# Patient Record
Sex: Female | Born: 1965 | Race: White | Hispanic: No | Marital: Married | Smoking: Never smoker
Health system: Southern US, Community
[De-identification: ages and names within clinical notes are randomized; demographics above are authoritative.]

## PROBLEM LIST (undated history)

## (undated) DIAGNOSIS — G43909 Migraine, unspecified, not intractable, without status migrainosus: Secondary | ICD-10-CM

## (undated) DIAGNOSIS — E039 Hypothyroidism, unspecified: Secondary | ICD-10-CM

## (undated) DIAGNOSIS — N809 Endometriosis, unspecified: Secondary | ICD-10-CM

## (undated) DIAGNOSIS — I719 Aortic aneurysm of unspecified site, without rupture: Secondary | ICD-10-CM

## (undated) DIAGNOSIS — J309 Allergic rhinitis, unspecified: Secondary | ICD-10-CM

## (undated) HISTORY — DX: Hypothyroidism, unspecified: E03.9

## (undated) HISTORY — DX: Aortic aneurysm of unspecified site, without rupture: I71.9

## (undated) HISTORY — PX: OTHER SURGICAL HISTORY: SHX169

## (undated) HISTORY — DX: Endometriosis, unspecified: N80.9

## (undated) HISTORY — DX: Migraine, unspecified, not intractable, without status migrainosus: G43.909

## (undated) HISTORY — PX: APPENDECTOMY: SHX54

## (undated) HISTORY — DX: Allergic rhinitis, unspecified: J30.9

## (undated) HISTORY — PX: CHOLECYSTECTOMY: SHX55

---

## 1989-02-03 DIAGNOSIS — N809 Endometriosis, unspecified: Secondary | ICD-10-CM

## 1989-02-03 HISTORY — DX: Endometriosis, unspecified: N80.9

## 1997-10-11 ENCOUNTER — Inpatient Hospital Stay (HOSPITAL_COMMUNITY): Admission: EM | Admit: 1997-10-11 | Discharge: 1997-10-13 | Payer: Self-pay | Admitting: *Deleted

## 1998-07-13 ENCOUNTER — Other Ambulatory Visit: Admission: RE | Admit: 1998-07-13 | Discharge: 1998-07-13 | Payer: Self-pay | Admitting: Obstetrics and Gynecology

## 1999-08-08 ENCOUNTER — Other Ambulatory Visit: Admission: RE | Admit: 1999-08-08 | Discharge: 1999-08-08 | Payer: Self-pay | Admitting: Obstetrics and Gynecology

## 2000-01-09 ENCOUNTER — Inpatient Hospital Stay (HOSPITAL_COMMUNITY): Admission: EM | Admit: 2000-01-09 | Discharge: 2000-01-10 | Payer: Self-pay | Admitting: Emergency Medicine

## 2000-01-09 ENCOUNTER — Encounter: Payer: Self-pay | Admitting: Emergency Medicine

## 2000-08-09 ENCOUNTER — Other Ambulatory Visit: Admission: RE | Admit: 2000-08-09 | Discharge: 2000-08-09 | Payer: Self-pay | Admitting: Gynecology

## 2001-09-03 ENCOUNTER — Other Ambulatory Visit: Admission: RE | Admit: 2001-09-03 | Discharge: 2001-09-03 | Payer: Self-pay | Admitting: Gynecology

## 2002-01-03 ENCOUNTER — Ambulatory Visit (HOSPITAL_COMMUNITY): Admission: RE | Admit: 2002-01-03 | Discharge: 2002-01-03 | Payer: Self-pay | Admitting: Gynecology

## 2002-01-03 ENCOUNTER — Encounter (INDEPENDENT_AMBULATORY_CARE_PROVIDER_SITE_OTHER): Payer: Self-pay | Admitting: *Deleted

## 2003-03-19 ENCOUNTER — Other Ambulatory Visit: Admission: RE | Admit: 2003-03-19 | Discharge: 2003-03-19 | Payer: Self-pay | Admitting: Gynecology

## 2003-06-01 ENCOUNTER — Other Ambulatory Visit: Admission: RE | Admit: 2003-06-01 | Discharge: 2003-06-01 | Payer: Self-pay | Admitting: Gynecology

## 2005-07-04 ENCOUNTER — Other Ambulatory Visit: Admission: RE | Admit: 2005-07-04 | Discharge: 2005-07-04 | Payer: Self-pay | Admitting: Gynecology

## 2005-08-09 ENCOUNTER — Encounter: Admission: RE | Admit: 2005-08-09 | Discharge: 2005-08-09 | Payer: Self-pay | Admitting: Gynecology

## 2006-09-06 ENCOUNTER — Other Ambulatory Visit: Admission: RE | Admit: 2006-09-06 | Discharge: 2006-09-06 | Payer: Self-pay | Admitting: Gynecology

## 2007-10-02 ENCOUNTER — Other Ambulatory Visit: Admission: RE | Admit: 2007-10-02 | Discharge: 2007-10-02 | Payer: Self-pay | Admitting: Gynecology

## 2007-10-03 LAB — CONVERTED CEMR LAB: Pap Smear: NORMAL

## 2008-09-10 ENCOUNTER — Emergency Department (HOSPITAL_COMMUNITY): Admission: EM | Admit: 2008-09-10 | Discharge: 2008-09-10 | Payer: Self-pay | Admitting: Emergency Medicine

## 2008-09-14 ENCOUNTER — Ambulatory Visit: Payer: Self-pay | Admitting: Internal Medicine

## 2008-09-14 ENCOUNTER — Encounter (INDEPENDENT_AMBULATORY_CARE_PROVIDER_SITE_OTHER): Payer: Self-pay | Admitting: *Deleted

## 2008-09-14 DIAGNOSIS — E782 Mixed hyperlipidemia: Secondary | ICD-10-CM | POA: Insufficient documentation

## 2008-09-14 DIAGNOSIS — E785 Hyperlipidemia, unspecified: Secondary | ICD-10-CM | POA: Insufficient documentation

## 2008-09-14 DIAGNOSIS — M549 Dorsalgia, unspecified: Secondary | ICD-10-CM | POA: Insufficient documentation

## 2008-09-14 DIAGNOSIS — K921 Melena: Secondary | ICD-10-CM

## 2008-09-14 DIAGNOSIS — E039 Hypothyroidism, unspecified: Secondary | ICD-10-CM

## 2008-09-15 LAB — CONVERTED CEMR LAB
ALT: 28 units/L (ref 0–35)
AST: 24 units/L (ref 0–37)
Albumin: 3.9 g/dL (ref 3.5–5.2)
Alkaline Phosphatase: 54 units/L (ref 39–117)
BUN: 12 mg/dL (ref 6–23)
Basophils Absolute: 0 10*3/uL (ref 0.0–0.1)
Basophils Relative: 0.6 % (ref 0.0–3.0)
Bilirubin Urine: NEGATIVE
Bilirubin, Direct: 0 mg/dL (ref 0.0–0.3)
CO2: 31 meq/L (ref 19–32)
Calcium: 9.3 mg/dL (ref 8.4–10.5)
Chloride: 106 meq/L (ref 96–112)
Cholesterol: 187 mg/dL (ref 0–200)
Creatinine, Ser: 0.7 mg/dL (ref 0.4–1.2)
Eosinophils Absolute: 0.4 10*3/uL (ref 0.0–0.7)
Eosinophils Relative: 6.9 % — ABNORMAL HIGH (ref 0.0–5.0)
GFR calc non Af Amer: 96.54 mL/min (ref >60)
Glucose, Bld: 90 mg/dL (ref 70–99)
HCT: 39.5 % (ref 36.0–46.0)
HDL: 60.9 mg/dL (ref >39.00)
Hemoglobin, Urine: NEGATIVE
Hemoglobin: 13.4 g/dL (ref 12.0–15.0)
INR: 0.9 (ref 0.8–1.0)
Ketones, ur: NEGATIVE mg/dL
LDL Cholesterol: 113 mg/dL — ABNORMAL HIGH (ref 0–99)
Leukocytes, UA: NEGATIVE
Lymphocytes Relative: 36.6 % (ref 12.0–46.0)
Lymphs Abs: 2 10*3/uL (ref 0.7–4.0)
MCHC: 33.9 g/dL (ref 30.0–36.0)
MCV: 97.9 fL (ref 78.0–100.0)
Monocytes Absolute: 0.4 10*3/uL (ref 0.1–1.0)
Monocytes Relative: 6.8 % (ref 3.0–12.0)
Neutro Abs: 2.6 10*3/uL (ref 1.4–7.7)
Neutrophils Relative %: 49.1 % (ref 43.0–77.0)
Nitrite: NEGATIVE
Platelets: 244 10*3/uL (ref 150.0–400.0)
Potassium: 4 meq/L (ref 3.5–5.1)
Prothrombin Time: 10.2 s — ABNORMAL LOW (ref 10.9–13.3)
RBC: 4.03 M/uL (ref 3.87–5.11)
RDW: 11.5 % (ref 11.5–14.6)
Sodium: 143 meq/L (ref 135–145)
Specific Gravity, Urine: <=1.005 (ref 1.000–1.030)
TSH: 1.14 microintl units/mL (ref 0.35–5.50)
Total Bilirubin: 0.9 mg/dL (ref 0.3–1.2)
Total CHOL/HDL Ratio: 3
Total Protein, Urine: NEGATIVE mg/dL
Total Protein: 7.1 g/dL (ref 6.0–8.3)
Triglycerides: 65 mg/dL (ref 0.0–149.0)
Urine Glucose: NEGATIVE mg/dL
Urobilinogen, UA: 0.2 (ref 0.0–1.0)
VLDL: 13 mg/dL (ref 0.0–40.0)
WBC: 5.4 10*3/uL (ref 4.5–10.5)
pH: 6.5 (ref 5.0–8.0)

## 2008-10-14 ENCOUNTER — Encounter: Payer: Self-pay | Admitting: Women's Health

## 2008-10-14 ENCOUNTER — Other Ambulatory Visit: Admission: RE | Admit: 2008-10-14 | Discharge: 2008-10-14 | Payer: Self-pay | Admitting: Gynecology

## 2008-10-14 ENCOUNTER — Ambulatory Visit: Payer: Self-pay | Admitting: Women's Health

## 2008-11-18 ENCOUNTER — Emergency Department (HOSPITAL_COMMUNITY): Admission: EM | Admit: 2008-11-18 | Discharge: 2008-11-18 | Payer: Self-pay | Admitting: Emergency Medicine

## 2008-11-26 ENCOUNTER — Ambulatory Visit: Payer: Self-pay | Admitting: Internal Medicine

## 2008-11-26 DIAGNOSIS — F411 Generalized anxiety disorder: Secondary | ICD-10-CM | POA: Insufficient documentation

## 2008-11-26 DIAGNOSIS — R079 Chest pain, unspecified: Secondary | ICD-10-CM | POA: Insufficient documentation

## 2008-12-01 ENCOUNTER — Ambulatory Visit: Payer: Self-pay | Admitting: Gastroenterology

## 2008-12-01 DIAGNOSIS — R1032 Left lower quadrant pain: Secondary | ICD-10-CM

## 2008-12-01 DIAGNOSIS — K219 Gastro-esophageal reflux disease without esophagitis: Secondary | ICD-10-CM

## 2008-12-01 LAB — CONVERTED CEMR LAB: Sed Rate: 28 mm/hr — ABNORMAL HIGH (ref 0–22)

## 2009-01-05 ENCOUNTER — Ambulatory Visit: Payer: Self-pay | Admitting: Gastroenterology

## 2009-01-05 ENCOUNTER — Encounter: Payer: Self-pay | Admitting: Gastroenterology

## 2009-01-07 ENCOUNTER — Encounter (INDEPENDENT_AMBULATORY_CARE_PROVIDER_SITE_OTHER): Payer: Self-pay | Admitting: *Deleted

## 2009-01-08 ENCOUNTER — Encounter: Payer: Self-pay | Admitting: Gastroenterology

## 2009-02-25 DIAGNOSIS — K208 Other esophagitis: Secondary | ICD-10-CM

## 2009-02-25 DIAGNOSIS — A048 Other specified bacterial intestinal infections: Secondary | ICD-10-CM | POA: Insufficient documentation

## 2009-02-25 DIAGNOSIS — K5730 Diverticulosis of large intestine without perforation or abscess without bleeding: Secondary | ICD-10-CM | POA: Insufficient documentation

## 2009-02-25 DIAGNOSIS — K449 Diaphragmatic hernia without obstruction or gangrene: Secondary | ICD-10-CM | POA: Insufficient documentation

## 2009-02-25 DIAGNOSIS — K222 Esophageal obstruction: Secondary | ICD-10-CM

## 2009-02-26 ENCOUNTER — Ambulatory Visit: Payer: Self-pay | Admitting: Gastroenterology

## 2009-09-14 ENCOUNTER — Ambulatory Visit: Payer: Self-pay | Admitting: Internal Medicine

## 2009-12-21 ENCOUNTER — Encounter: Admission: RE | Admit: 2009-12-21 | Discharge: 2009-12-21 | Payer: Self-pay | Admitting: Endocrinology

## 2010-09-12 LAB — DIFFERENTIAL
Basophils Absolute: 0 10*3/uL (ref 0.0–0.1)
Basophils Relative: 0 % (ref 0–1)
Eosinophils Absolute: 0.2 10*3/uL (ref 0.0–0.7)
Eosinophils Relative: 3 % (ref 0–5)
Lymphocytes Relative: 27 % (ref 12–46)
Lymphs Abs: 2 10*3/uL (ref 0.7–4.0)
Monocytes Absolute: 0.9 10*3/uL (ref 0.1–1.0)
Monocytes Relative: 12 % (ref 3–12)
Neutro Abs: 4.1 10*3/uL (ref 1.7–7.7)
Neutrophils Relative %: 57 % (ref 43–77)

## 2010-09-12 LAB — COMPREHENSIVE METABOLIC PANEL
ALT: 19 U/L (ref 0–35)
AST: 16 U/L (ref 0–37)
Albumin: 3.4 g/dL — ABNORMAL LOW (ref 3.5–5.2)
Alkaline Phosphatase: 56 U/L (ref 39–117)
BUN: 8 mg/dL (ref 6–23)
CO2: 27 mEq/L (ref 19–32)
Calcium: 8.5 mg/dL (ref 8.4–10.5)
Chloride: 100 mEq/L (ref 96–112)
Creatinine, Ser: 0.72 mg/dL (ref 0.4–1.2)
GFR calc Af Amer: >60 mL/min (ref >60)
GFR calc non Af Amer: >60 mL/min (ref >60)
Glucose, Bld: 98 mg/dL (ref 70–99)
Potassium: 3.7 mEq/L (ref 3.5–5.1)
Sodium: 134 mEq/L — ABNORMAL LOW (ref 135–145)
Total Bilirubin: 0.9 mg/dL (ref 0.3–1.2)
Total Protein: 6.4 g/dL (ref 6.0–8.3)

## 2010-09-12 LAB — CBC
HCT: 36.7 % (ref 36.0–46.0)
Hemoglobin: 12.6 g/dL (ref 12.0–15.0)
MCHC: 34.3 g/dL (ref 30.0–36.0)
MCV: 96.7 fL (ref 78.0–100.0)
Platelets: 273 10*3/uL (ref 150–400)
RBC: 3.8 MIL/uL — ABNORMAL LOW (ref 3.87–5.11)
RDW: 11.9 % (ref 11.5–15.5)
WBC: 7.2 10*3/uL (ref 4.0–10.5)

## 2010-09-12 LAB — POCT CARDIAC MARKERS
CKMB, poc: <1 ng/mL — ABNORMAL LOW (ref 1.0–8.0)
Myoglobin, poc: 46 ng/mL (ref 12–200)
Troponin i, poc: <0.05 ng/mL (ref 0.00–0.09)

## 2010-09-12 LAB — URINALYSIS, ROUTINE W REFLEX MICROSCOPIC
Bilirubin Urine: NEGATIVE
Glucose, UA: NEGATIVE mg/dL
Hgb urine dipstick: NEGATIVE
Ketones, ur: NEGATIVE mg/dL
Nitrite: NEGATIVE
Protein, ur: NEGATIVE mg/dL
Specific Gravity, Urine: 1.007 (ref 1.005–1.030)
Urobilinogen, UA: 0.2 mg/dL (ref 0.0–1.0)
pH: 7 (ref 5.0–8.0)

## 2010-09-12 LAB — LIPASE, BLOOD: Lipase: 22 U/L (ref 11–59)

## 2010-09-12 LAB — POCT PREGNANCY, URINE: Preg Test, Ur: NEGATIVE

## 2010-09-12 LAB — D-DIMER, QUANTITATIVE: D-Dimer, Quant: 0.39 ug/mL-FEU (ref 0.00–0.48)

## 2010-09-14 LAB — URINALYSIS, ROUTINE W REFLEX MICROSCOPIC
Glucose, UA: NEGATIVE mg/dL
Hgb urine dipstick: NEGATIVE
Ketones, ur: NEGATIVE mg/dL
Nitrite: NEGATIVE
Protein, ur: NEGATIVE mg/dL
Specific Gravity, Urine: 1.019 (ref 1.005–1.030)
Urobilinogen, UA: 0.2 mg/dL (ref 0.0–1.0)
pH: 5.5 (ref 5.0–8.0)

## 2010-09-14 LAB — URINE MICROSCOPIC-ADD ON

## 2010-09-14 LAB — POCT PREGNANCY, URINE: Preg Test, Ur: NEGATIVE

## 2010-10-21 NOTE — H&P (Signed)
[facility]  Patient:    Olivia Mendez, Olivia Mendez                         MRN: [id number] Adm. Date:  [id number] Disc. Date: [id number] Attending:  Brandy Hale                         History and Physical  CHIEF COMPLAINT:  Abdominal pain.  HISTORY OF PRESENT ILLNESS:  This is a 45 year old white female who presents with a 4-5 day history of epigastric pain radiating to her back.  This woke her up about midnight.  She had eaten spaghetti for supper and had some cookies and milk just before bedtime.  She was nauseated but did not vomit. She has not had any fever or chills.  She denies diarrhea or change in her bowel habits.  Previously she has had occasional epigastric discomfort and indigestion, but no severe pain.  She denies a history of liver problems, cardiac or pulmonary problems.  She came to the emergency room and saw Dr. Lynelle Doctor.  CBC, liver function tests, amylase, lipase and EKG were all unremarkable.  A gallbladder ultrasound shows numerous gallstones and a thickened gallbladder wall. After 3-4 hours in the emergency room she continues to have some pain although it is better.  She requests surgical intervention at this time ad I was called to see her.  PAST MEDICAL HISTORY:  Appendectomy in 1999 by Dr. Ovidio Kin.  She is hypothyroid.  MEDICATIONS:  Synthroid 0.15 mg q.d.  ALLERGIES:  No known drug allergies.  VICODIN makes her nauseated.  SOCIAL HISTORY:  The patient lives in [place].  She is married.  They have no children.  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 She denies the use of alcohol or tobacco.  FAMILY HISTORY:  Mother living, has had her gallbladder out.  Father living, no medical problems.  One brother living and well.  REVIEW OF SYSTEMS:  All systems are reviewed and are negative except as described above.  PHYSICAL EXAMINATION:  GENERAL:  A healthy young woman in mild distress.  VITAL SIGNS:   Blood pressure 112/90, pulse 82, respirations 16, temperature 96.8.  HEENT:  Sclerae clear. Extraocular movements intact.  Oropharynx clear.  NECK:  Supple, nontender, no mass.  Carotids 2+ and no bruit. No adenopathy and no thyromegaly.  LUNGS:  Clear to auscultation.  No CVA tenderness.  HEART:  Regular rate and rhythm with no murmur.  ABDOMEN:  Soft.  She is tender in the right upper quadrant but no mass, minimal guarding, no distention, active bowel sounds.  Well-healed laparoscopy scars noted from her previous laparoscopic appendectomy.  PELVIC EXAM:  Deferred.  EXTREMITIES:  No edema, good pulses.  NEUROLOGIC EXAM:  Grossly within normal limits.  IMPRESSION: 1. Acute cholecystitis with cholelithiasis. 2. History of hypothyroidism.  PLAN:  The patient will be admitted and will be placed on IV antibiotics and analgesics.  We will proceed with laparoscopic cholecystectomy today.  I have discussed the indications and details of surgery with her, her her husband and her mother.  I have outlined the indications and procedures.  I have discussed the risks and complications in great detail, including but not limited to bile leak, common bile duct injury, bleeding, infection, intestinal injury, wound infection, conversion to open laparotomy and other unforeseen complications.  They seemed to understand these issues well.  At this time all  of her questions were answered.  She agrees with this plan and would like to have this done. DD:  01/09/00 TD:  01/09/00 Job: [id number]

## 2010-10-21 NOTE — H&P (Signed)
NAME:  Olivia Mendez, Olivia Mendez                          ACCOUNT NO.:  [id number]   MEDICAL RECORD NO.:  [id number]                   PATIENT TYPE:  AMB   LOCATION:  [facility]C                                  FACILITY:  [facility]   PHYSICIAN:  Ivor Costa. Farrel Gobble, M.D.              DATE OF BIRTH:  June 13, 1965   DATE OF ADMISSION:  01/03/2002  DATE OF DISCHARGE:                                HISTORY & PHYSICAL   CHIEF COMPLAINT:  Persistent complex adnexal mass, questionable endocervical  lesion.   HISTORY OF PRESENT ILLNESS:  The patient is a 45 year old gravida 0 who was  noted on ultrasound to have a 2 cm complex adnexal mass back in April that  appeared to be consistent with a hemorrhagic cyst.  She was noted to have  thickened endocervical canal with a questionable endocervical polyp.  She  returned back to the office for a sonohysterogram, which showed the  persistence of the adnexal mass; however, failed to demonstrate endocervical  polyp.  The adnexal mass because of its echogenicity was rescanned one more  time for a total of four months' worth of surveillance and was noted to be  unchanged throughout, but again what appears to be an endocervical polyp was  seen.  Of note, the patient reports having what she was told was a  hemorrhagic cyst of the left ovary at the time of an IVF cycle two years  ago.  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   PAST OB/GYN HISTORY:  Significant for regular cycles with four to five days  of flow, occasional dyspareunia, premenstrual dysmenorrhea, as described  above.  Pap smears have been normal.   PAST MEDICAL HISTORY:  Significant for hypothyroidism, which is stable.   PAST SURGICAL HISTORY:  Significant for appendectomy and cholecystectomy in  1999 and 2000 respectively.   MEDICATIONS:  None.   ALLERGIES:  None.   SOCIAL HISTORY:  She is married, one cup of caffeine a day, rare alcohol, no  formal exercise.   FAMILY HISTORY:  Noncontributory, but per patient is negative for  endometriosis.   PHYSICAL EXAMINATION:  GENERAL:  She is a well-appearing female in no acute  distress.  HEART:  Regular rate.  CHEST:  Her lungs are clear to auscultation.  ABDOMEN:  Soft, nontender without rebound or guarding.  PELVIC:  On speculum exam, has unremarkable-appearing cervix.  The uterus is  anteverted, mobile.  The adnexa were without tenderness or fullness.   LABORATORY DATA:  Ultrasound shows an endocervical canal that appears to  measure 11 mm sagittally, questionable echogenic focus seen that measures 4  mm within the canal.  The right ovary has what appeared to be a hemorrhagic  corpus luteum.  The  left has septated cyst that measures 2.6 x 2 x 3.1,  which is slightly larger than the original scan, but is essentially  unchanged with an echogenic focus.  It measures 4 mm.   ASSESSMENT:  Persistent complex adnexal mass, questionable endocervical  lesion.   PLAN:  The patient will present for a laparoscopic cystectomy, possible LSO  with provisions for possible fulguration of endometriosis.  In addition, we  will do a hysteroscopy to ascertain whether or not indeed she does have an  endocervical polyp and, if so, remove that as well.  They had not ruled out  the possibility of doing another IVF cycle to obtain a pregnancy.                                               Ivor Costa. Farrel Gobble, M.D.    THL/MEDQ  D:  01/02/2002  T:  01/03/2002  Job:  [id number]   cc:   Ivor Costa. Farrel Gobble, M.D.

## 2010-10-21 NOTE — Discharge Summary (Signed)
[facility]  Patient:    LENIYAH, MARTELL                         MRN: [id number] Adm. Date:  [id number] Disc. Date: [id number] Attending:  Brandy Hale CC:         2 copies, Dr. Derrell Lolling   Discharge Summary  FINAL DIAGNOSIS:  Acute cholecystitis with cholelithiasis.  OPERATION PERFORMED:  Laparoscopic cholecystectomy, January 09, 2000.  HISTORY OF PRESENT ILLNESS:  This is a 45 year old white female who presented to the emergency room with a four to five day history of epigastric pain radiating to her back.  She had had a spaghetti supper and some cookies and milk before bedtime, and then the pain began slightly after that.  She became nauseated but did not vomit, did not have any fever or chills.  She came to the emergency room and saw Dr. Buren Kos.  CBC, liver function tests, amylase, lipase, and EKG were all unremarkable.  A gallbladder ultrasound showed numerous gallstones and a thickened gallbladder wall consistent with acute cholecystitis.  She received some analgesic narcotic medication in the emergency room and felt somewhat better, although the pain did not resolve.  I was asked to see her and felt that she was having an acute gallbladder attack. It was her desire to be admitted to the hospital for cholecystectomy.  For details of her past medical history, social history, and family history, please see the detailed admission note.  PHYSICAL EXAMINATION:  GENERAL:  Healthy young woman in mild distress.  VITAL SIGNS:  Blood pressure 112/90, temperature 96.8, pulse 82, respirations 16.  HEENT:  Sclerae clear.  Extraocular movements intact.  LUNGS:  Clear to auscultation.  No CVA tenderness.  HEART:  Regular rate and rhythm with no murmur.  ABDOMEN:  Soft.  She had tenderness with some guarding in the right upper quadrant.  No rebound.  No distension.  Bowel sounds present.  Well healed laparoscopy scar from previous laparoscopic  appendectomy.  HOSPITAL COURSE:  The patient was admitted to the hospital.  In the afternoon, she was taken to the operating room and underwent laparoscopic cholecystectomy.  She was found to have an edematous gallbladder with numerous gallstones.  There were no other abnormal findings.  The laparoscopic cholecystectomy was uneventful.  Postoperatively, the patient did relatively well.  She had some nausea off and on over the next 12 hours, but that resolved by the afternoon of August 7. She desired going home in the evening of August 7.  At that time, she was feeling better; the nausea had resolved, and she was afebrile, ambulating, and her wounds looked fine.  She was asked to return to see me in the office in the three to four weeks.  DISCHARGE MEDICATIONS:  She was given a prescription for Vicodin for pain.DD: 01/18/00 TD:  01/18/00 Job: [id number]

## 2010-10-21 NOTE — Op Note (Signed)
[facility]  Patient:    Olivia Mendez, Olivia Mendez                         MRN: [id number] Proc. Date: 01/09/00 Adm. Date:  [id number] Attending:  Brandy Hale                           Operative Report  PREOPERATIVE DIAGNOSIS:  Acute cholecystitis with cholelithiasis.  POSTOPERATIVE DIAGNOSIS:  Acute cholecystitis with cholelithiasis.  OPERATION PERFORMED:  Laparoscopic cholecystectomy.  SURGEON:  Angelia Mould. Derrell Lolling, M.D.  ASSISTANT:  Chevis Pretty, M.D.  BRIEF HISTORY:  This is a 45 year old white female who presented to the emergency room last night with a 4-5 hour history of epigastric pain and nausea.  Examination revealed that she was in mild to moderate pain with abdominal tenderness localized to the right upper quadrant.  All of her lab work was normal, but her ultrasound showed multiple gallstones and a thickened gallbladder wall.  It was felt that her clinical presentation was consistent with acute cholecystitis.  Her pain was improved, but did not resolve following the use of analgesics.  We brought her into the hospital for cholecystectomy.  FINDINGS:  The gallbladder was edematous, thick walled and acutely inflamed. It contained several large gallstones and numerous smaller gallstones.  The anatomy of the cystic duct and cystic artery were conventional.  The cystic duct was very tiny and was long.  The liver, stomach, duodenum, small bowel, large bowel and peritoneal surface were normal to inspection.  DESCRIPTION OF PROCEDURE:  Following the induction of general endotracheal anesthesia, the patients abdomen was prepped and draped in a sterile fashion. Marcaine 0.5% with epinephrine was used as a local infiltration anesthetic. The patient had previously had laparoscopic appendectomy and there was a vertical incision at the lower rim of the umbilicus.  This had a keloid formation and the keloid was excised.  The fascia was incised in the  midline. The abdominal cavity was entered under direct vision.  A 10 mm Hasson trocar was inserted and secured to the pursestring suture of 0 Vicryl.  A video camera was inserted with visualization and findings as described above.  There were no adhesions around the umbilicus.  A 10 mm trocar was placed in the subxiphoid region and two 5 mm trocars were placed in the right mid abdomen.  The gallbladder was identified and placed on traction. Adhesions were taken down.  We dissected out the cystic duct and cystic artery very carefully.  We dissected out the infundibulum of the gallbladder so that we could clearly see significant length of the cystic duct.  We clearly identified the junction of the cystic duct with the gallbladder and clearly identified the region of the common bile duct.  The cystic duct and cystic artery were secured with metal clips and divided.  The gallbladder was dissected from its bed with electrocautery, and that dissection was uneventful.  The gallbladder was removed through the umbilical port.  The operative field was copiously irrigated with 1000 cc of saline.  At the completion of the case, there was no bleeding and no bile leak whatsoever. The trocars were removed under direct vision and there was no bleeding from the trocar sites.  The pneumoperitoneum was released.  The fascia at the umbilicus was closed with 0 Vicryl sutures.  The fascia in the subxiphoid region was closed with 0 Vicryl sutures.  The skin  was closed with subcuticular sutures of 4-0 Vicryl and Steri-Strips.  Clean bandages were placed and the patient taken to the recovery room in stable condition. Estimated blood loss was 20 cc.  Complications none.  Sponge, needle and instrument counts were correct. DD:  01/09/00 TD:  01/10/00 Job: [id number]

## 2010-10-21 NOTE — Op Note (Signed)
NAME:  Olivia Mendez, Olivia Mendez                          ACCOUNT NO.:  [id number]   MEDICAL RECORD NO.:  [id number]                   PATIENT TYPE:  AMB   LOCATION:  [facility]C                                  FACILITY:  [facility]   PHYSICIAN:  Ivor Costa. Farrel Gobble, M.D.              DATE OF BIRTH:  April 27, 1966   DATE OF PROCEDURE:  01/03/2002  DATE OF DISCHARGE:                                 OPERATIVE REPORT   PREOPERATIVE DIAGNOSES:  1. Persistent complex adnexal mass.  2. Possible endocervical polyp.   POSTOPERATIVE DIAGNOSES:  1. Persistent complex adnexal mass.  2. Possible endocervical polyp.   OPERATION PERFORMED:  1. Laparoscopic excision of a left endometrioma.  2. Excision and fulguration of endometriosis.  3. Diagnostic hysteroscopy.   SURGEON:  Ivor Costa. Farrel Gobble, M.D.   ASSISTANT:  Rande Brunt. Eda Paschal, M.D. for the laparoscopic portion.   ANESTHESIA:  General.   FINDINGS:  There was left endometrioma with __________ anterior cul-de-sac  and left side wall.  The right side wall and ovary were normal.  Normal  endocervical canal.  Both ostia were visualized on hysteroscopy.  Final  pathology was cyst wall, left peritoneal side wall, anterior cul-de-sac,  peritoneal biopsies.   DESCRIPTION OF PROCEDURE:  The patient was taken to the operating room.  General anesthesia was induced.  Patient in dorsal lithotomy position,  prepped and draped in the usual sterile fashion.  A bivalve was placed in  the vagina and the cervix was visualized and stabilized with the Hulka  manipulator.  Attention was then turned to the abdomen.  An infraumbilical  incision was made with a scalpel through which the Veress needle was  inserted.  Opening pressure was 6 and a pneumoperitoneum was created until  tympany was appreciated above the liver.  The #10-11 disposable trocar was  then inserted through infraumbilical port and placement in the abdominal  cavity was confirmed.  The findings were as noted above.   The two 5 ports  were made laterally under direct visualization.  Using these two ports, the  ovaries were elevated.  Side walls were inspected.  The left ovary, tubo-  ovarian ligament was grasped.  The ovary was secured and a small peritoneal  window had been noted in the peritoneal side wall.  Using this window as a  starting point, we then sharply excised the thickened white plaques that  were seen earlier in the procedure.  A section of ureter was placed and  hemostasis of the area was confirmed.  While elevating and rotating the  ovary, the endometrioma incidentally ruptured securing the two sides of the  rent, the incision was extended slightly and the cyst wall was able to be  sharply and bluntly dissected out.  There was an area of thickened  endometriosis that had gone through-and-through to the ovarian serosa and  was actually adherent to the side wall.  This was  sharply excised and any  areas of bleeding in the capsule from the sharp ovarian incision was treated  with cautery and hemostasis was assured.  The residual portion on the side  walls where the ovary had been adherent was also treated with cautery in  order to fulgurate the endometriosis although it was not bleeding.  Attention was turned towards the anterior cul-de-sac.  With careful position  of the bladder, the peritoneum was grasped.  The plaque was noted to be  markedly thickened and unable to be elevated and therefore through a small  incision further away from the plaque and undermined the peritoneum in this  area incorporating two endometriosis plaques.  The retroperitoneal space was  gently dissected paying careful attention to the bladder underneath until  both lesions were sharply dissected off.  There was a small area of  endometriosis lateral to where we had gone and this was able to be  successfully treated with cautery.  A small area of bleeding in the inferior  aspect of the initial dissection was  bleeding and gently treated with  cautery.  The pelvis was then irrigated with copious amounts of warm saline.  We reinspected both the ovaries, the left side wall as well as the anterior  cul-de-sac and all areas were noted to be hemostatic.  The instruments were  then removed under direct visualization.  The infraumbilical port was closed  with figure-of-eight of 0 Vicryl.  The skin was closed with 3-0 plain.  The  ports were all injected with 0.25% Marcaine solution for a total of 10 cc.  The lower ports were reapproximated with tincture of Benzoin and Steri-  Strips.  The patient was then repositioned.  The uterine manipulator was  removed.  Bivalve was placed in the vagina.  The cervix was visualized with  a single toothed tenaculum.  Uterus sounded to eight.  The cervix was then  gently dilated up to 21 [place] after which a hysteroscope was advanced to  the cervix as the lesion of concern endocervical, therefore inspection  occurred there and it was noted to be free of any endocervical polyps.  Her  uterine cavity was remarkable only for normal changes to endometrium from  progesterone.  The tubal ostia were seen bilaterally.  The hysteroscope was  then removed.  The I&O deficit was 30 cc.  The patient tolerated both  procedures well.  Sponge and needle counts were correct times two.  She was  transferred to the PACU in stable condition.                                                Ivor Costa. Farrel Gobble, M.D.    THL/MEDQ  D:  01/03/2002  T:  01/08/2002  Job:  [id number]

## 2012-07-23 ENCOUNTER — Ambulatory Visit: Payer: Managed Care, Other (non HMO) | Admitting: Emergency Medicine

## 2012-07-23 VITALS — BP 111/72 | HR 90 | Temp 98.5°F | Resp 16 | Ht 68.0 in | Wt 202.1 lb

## 2012-07-23 DIAGNOSIS — J209 Acute bronchitis, unspecified: Secondary | ICD-10-CM

## 2012-07-23 DIAGNOSIS — J018 Other acute sinusitis: Secondary | ICD-10-CM

## 2012-07-23 MED ORDER — AMOXICILLIN-POT CLAVULANATE 875-125 MG PO TABS: 1.0000 | ORAL_TABLET | Freq: Two times a day (BID) | ORAL | Status: DC

## 2012-07-23 MED ORDER — BENZONATATE 100 MG PO CAPS: 100.0000 mg | ORAL_CAPSULE | Freq: Three times a day (TID) | ORAL | Status: DC | PRN

## 2012-07-23 MED ORDER — PSEUDOEPHEDRINE-GUAIFENESIN ER 60-600 MG PO TB12: 1.0000 | ORAL_TABLET | Freq: Two times a day (BID) | ORAL | Status: AC

## 2012-07-23 NOTE — Patient Instructions (Addendum)

## 2012-07-23 NOTE — Progress Notes (Signed)
[facility] [address] [place] [address] [phone]  Date:  07/23/2012   Name:  Olivia Mendez   DOB:  [birth year]   MRN:  [id number]  PCP:  Oliver Barre, MD    Chief Complaint: Cough, Nasal Congestion, Sore Throat and Headache   History of Present Illness:  Olivia Mendez is a 47 y.o. very pleasant female patient who presents with the following:  Ill for one week and has nasal congestion and cough.  Had a fever over past weekend.  Has purulent nasal drainage and post nasal drip and sore throat.  Cough productive purulent sputum.  No wheezing or shortness of breath.  No nausea or vomiting.  No flu shot.  Husband ill three weeks ago.  No improvement with OTC medication.  Patient Active Problem List  Diagnosis  . HELICOBACTER PYLORI INFECTION  . HYPOTHYROIDISM  . HYPERLIPIDEMIA  . ANXIETY  . EROSIVE ESOPHAGITIS  . ESOPHAGEAL STRICTURE  . GERD  . HIATAL HERNIA  . DIVERTICULOSIS, COLON  . HEMATOCHEZIA  . BACK PAIN  . CHEST PAIN  . ABDOMINAL PAIN, LEFT LOWER QUADRANT    Past Medical History  Diagnosis Date  . Thyroid disease     Past Surgical History  Procedure Laterality Date  . Appendectomy    . Cholecystectomy      History  Substance Use Topics  . Smoking status: Never Smoker   . Smokeless tobacco: Not on file  . Alcohol Use: Yes     Comment: socially rare    Family History  Problem Relation Age of Onset  . Diabetes Mother   . Multiple sclerosis Maternal Grandmother   . Heart disease Maternal Grandfather   . Cancer Paternal Grandmother     melanoma  . Heart disease Paternal Grandfather     Allergies  Allergen Reactions  . Vicodin (Hydrocodone-Acetaminophen)     Medication list has been reviewed and updated.  No current outpatient prescriptions on file prior to visit.   No current facility-administered medications on file prior to visit.    Review of Systems:  As per HPI, otherwise negative.    Physical  Examination: Filed Vitals:   07/23/12 1208  BP: 111/72  Pulse: 90  Temp: 98.5 F (36.9 C)  Resp: 16   Filed Vitals:   07/23/12 1208  Height: 5\' 8"  (1.727 m)  Weight: 202 lb 1.6 oz (91.672 kg)   Body mass index is 30.74 kg/(m^2). Ideal Body Weight: Weight in (lb) to have BMI = 25: 164.1  GEN: WDWN, moderate distress, Non-toxic, A & O x 3 HEENT: Atraumatic, Normocephalic. Neck supple. No masses, No LAD. Ears and Nose: No external deformity. CV: RRR, No M/G/R. No JVD. No thrill. No extra heart sounds. PULM: CTA B, no wheezes, crackles, rhonchi. No retractions. No resp. distress. No accessory muscle use. ABD: S, NT, ND, +BS. No rebound. No HSM. EXTR: No c/c/e NEURO Normal gait.  PSYCH: Normally interactive. Conversant. Not depressed or anxious appearing.  Calm demeanor.    Assessment and Plan: Sinusitis Bronchitis augmentin mucinex Tessalon Follow up as needed  Carmelina Dane, MD

## 2012-07-24 ENCOUNTER — Encounter: Payer: Self-pay | Admitting: Women's Health

## 2012-07-24 ENCOUNTER — Ambulatory Visit (INDEPENDENT_AMBULATORY_CARE_PROVIDER_SITE_OTHER): Payer: Managed Care, Other (non HMO) | Admitting: Women's Health

## 2012-07-24 ENCOUNTER — Other Ambulatory Visit (HOSPITAL_COMMUNITY)
Admission: RE | Admit: 2012-07-24 | Discharge: 2012-07-24 | Disposition: A | Discharge status: Home / Self Care | Payer: Managed Care, Other (non HMO) | Source: Ambulatory Visit | Attending: Obstetrics and Gynecology | Admitting: Obstetrics and Gynecology

## 2012-07-24 VITALS — BP 116/70 | Ht 66.5 in | Wt 190.0 lb

## 2012-07-24 DIAGNOSIS — Z01419 Encounter for gynecological examination (general) (routine) without abnormal findings: Secondary | ICD-10-CM | POA: Insufficient documentation

## 2012-07-24 DIAGNOSIS — Z833 Family history of diabetes mellitus: Secondary | ICD-10-CM

## 2012-07-24 DIAGNOSIS — Z13220 Encounter for screening for lipoid disorders: Secondary | ICD-10-CM

## 2012-07-24 LAB — CBC WITH DIFFERENTIAL/PLATELET
Basophils Absolute: 0 10*3/uL (ref 0.0–0.1)
Eosinophils Relative: 2 % (ref 0–5)
Lymphocytes Relative: 28 % (ref 12–46)
Lymphs Abs: 1.9 10*3/uL (ref 0.7–4.0)
MCV: 89.4 fL (ref 78.0–100.0)
Neutro Abs: 4 10*3/uL (ref 1.7–7.7)
Platelets: 267 10*3/uL (ref 150–400)
RBC: 4.24 MIL/uL (ref 3.87–5.11)
WBC: 6.9 10*3/uL (ref 4.0–10.5)

## 2012-07-24 LAB — LIPID PANEL
HDL: 62 mg/dL (ref >39)
Total CHOL/HDL Ratio: 2.9 Ratio
VLDL: 14 mg/dL (ref 0–40)

## 2012-07-24 LAB — GLUCOSE, RANDOM: Glucose, Bld: 97 mg/dL (ref 70–99)

## 2012-07-24 NOTE — Progress Notes (Signed)
Olivia Mendez [birth year] [id number]    History:    The patient presents for annual exam.  Postmenopausal with no bleeding greater than 3 years/ no HRT. Last office visit 2010. Has seen Dr. Talmage Nap for hyperthyroidism and reports having a normal bone density. Normal Pap and mammograms. Was seen at an urgent care several days ago for bronchitis currently on amoxicillin and getting better.   Past medical history, past surgical history, family history and social history were all reviewed and documented in the EPIC chart. 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 History of cholecystectomy 2001, appendectomy 99. History of infertility/endometriosis/azoospermia. 2003 endometrioma removed.   ROS:  A  ROS was performed and pertinent positives and negatives are included in the history.  Exam:  Filed Vitals:   07/24/12 1515  BP: 116/70    General appearance:  Normal Head/Neck:  Normal, without cervical or supraclavicular adenopathy. Thyroid:  Symmetrical, normal in size, without palpable masses or nodularity. Respiratory  Effort:  Normal  Auscultation:  Clear without wheezing or rhonchi Cardiovascular  Auscultation:  Regular rate, without rubs, murmurs or gallops  Edema/varicosities:  Not grossly evident Abdominal  Soft,nontender, without masses, guarding or rebound.  Liver/spleen:  No organomegaly noted  Hernia:  None appreciated  Skin  Inspection:  Grossly normal  Palpation:  Grossly normal Neurologic/psychiatric  Orientation:  Normal with appropriate conversation.  Mood/affect:  Normal  Genitourinary    Breasts: Examined lying and sitting.     Right: Without masses, retractions, discharge or axillary adenopathy.     Left: Without masses, retractions, discharge or axillary adenopathy.   Inguinal/mons:  Normal without inguinal adenopathy  External genitalia:  Normal  BUS/Urethra/Skene's glands:  Normal  Bladder:  Normal  Vagina:  Normal  Cervix:  Normal  Uterus:    normal in size, shape and contour.  Midline and mobile  Adnexa/parametria:     Rt: Without masses or tenderness.   Lt: Without masses or tenderness.  Anus and perineum: Normal  Digital rectal exam: Normal sphincter tone without palpated masses or tenderness  Assessment/Plan:  47 y.o. M. WF G0  for annual exam.     Normal postmenopausal exam Hypothyroidism-Dr. Talmage Nap labs and meds  Plan: SBE's, reviewed importance of an annual mammogram/last one 2007, vitamin D 2000 daily, calcium rich diet, and exercise encouraged. Reports normal bone density at Dr. Talmage Nap will have report faxed to our office. CBC, glucose, lipid panel, UA, Pap. Reviewed new Pap screening guidelines/last normal Pap 2010.[name]    Harrington [facility], 5:13 PM 07/24/2012

## 2012-07-24 NOTE — Patient Instructions (Addendum)

## 2012-07-25 LAB — URINALYSIS W MICROSCOPIC + REFLEX CULTURE
Casts: NONE SEEN
Crystals: NONE SEEN
Ketones, ur: NEGATIVE mg/dL
Leukocytes, UA: NEGATIVE
Nitrite: NEGATIVE
Specific Gravity, Urine: 1.009 (ref 1.005–1.030)
Squamous Epithelial / LPF: NONE SEEN
Urobilinogen, UA: 0.2 mg/dL (ref 0.0–1.0)
pH: 6 (ref 5.0–8.0)

## 2012-07-30 ENCOUNTER — Encounter: Payer: Self-pay | Admitting: Women's Health

## 2013-04-10 ENCOUNTER — Other Ambulatory Visit: Payer: Self-pay

## 2013-07-25 ENCOUNTER — Encounter: Payer: Managed Care, Other (non HMO) | Admitting: Women's Health

## 2013-07-29 ENCOUNTER — Encounter: Payer: Self-pay | Admitting: Women's Health

## 2013-10-13 ENCOUNTER — Ambulatory Visit: Payer: Managed Care, Other (non HMO) | Admitting: Emergency Medicine

## 2013-10-13 VITALS — BP 116/80 | HR 76 | Temp 98.1°F | Resp 20 | Ht 67.0 in | Wt 212.2 lb

## 2013-10-13 DIAGNOSIS — J209 Acute bronchitis, unspecified: Secondary | ICD-10-CM

## 2013-10-13 DIAGNOSIS — J040 Acute laryngitis: Secondary | ICD-10-CM

## 2013-10-13 MED ORDER — AZITHROMYCIN 250 MG PO TABS: ORAL_TABLET | ORAL | Status: DC

## 2013-10-13 MED ORDER — PROMETHAZINE-CODEINE 6.25-10 MG/5ML PO SYRP: 5.0000 mL | ORAL_SOLUTION | Freq: Four times a day (QID) | ORAL | Status: DC | PRN

## 2013-10-13 NOTE — Progress Notes (Signed)
[facility] [address], [place] [address] [phone]  Date:  10/13/2013   Name:  Olivia Mendez Warrior   DOB:  01/25/1966   MRN:  [id number]  PCP:  Oliver BarreJames John, MD    Chief Complaint: Cough   History of Present Illness:  Olivia Mendez Behrman is a 48 y.o. very pleasant female patient who presents with the following:  Cough productive of purulent scant sputum.  Has no wheezing or shortness of breath.  Some nasal congestion and post nasal drainage. Very hoarse.  No fever or chills.  No nausea of vomiting.  No stool change or rash.  No improvement with over the counter medications or other home remedies. Denies other complaint or health concern today.   Patient Active Problem List   Diagnosis Date Noted  . HELICOBACTER PYLORI INFECTION 02/25/2009  . EROSIVE ESOPHAGITIS 02/25/2009  . ESOPHAGEAL STRICTURE 02/25/2009  . HIATAL HERNIA 02/25/2009  . DIVERTICULOSIS, COLON 02/25/2009  . GERD 12/01/2008  . ABDOMINAL PAIN, LEFT LOWER QUADRANT 12/01/2008  . ANXIETY 11/26/2008  . CHEST PAIN 11/26/2008  . HYPOTHYROIDISM 09/14/2008  . HYPERLIPIDEMIA 09/14/2008  . HEMATOCHEZIA 09/14/2008  . BACK PAIN 09/14/2008    Past Medical History  Diagnosis Date  . Thyroid disease     Past Surgical History  Procedure Laterality Date  . Appendectomy    . Cholecystectomy      History  Substance Use Topics  . Smoking status: Never Smoker   . Smokeless tobacco: Never Used  . Alcohol Use: Yes     Comment: socially rare    Family History  Problem Relation Age of Onset  . Diabetes Mother   . Multiple sclerosis Maternal Grandmother   . Heart disease Maternal Grandfather   . Cancer Paternal Grandmother     melanoma  . Heart disease Paternal Grandfather     Allergies  Allergen Reactions  . Vicodin [Hydrocodone-Acetaminophen]     Medication list has been reviewed and updated.  Current Outpatient Prescriptions on File Prior to Visit  Medication Sig Dispense Refill  .  levothyroxine (SYNTHROID, LEVOTHROID) 150 MCG tablet Take 150 mcg by mouth daily.      [name]. liothyronine (CYTOMEL) 5 MCG tablet Take 5 mcg by mouth daily.       No current facility-administered medications on file prior to visit.    Review of Systems:  As per HPI, otherwise negative.    Physical Examination: Filed Vitals:   10/13/13 2003  BP: 116/80  Pulse: 76  Temp: 98.1 F (36.7 C)  Resp: 20   Filed Vitals:   10/13/13 2003  Height: 5\' 7"  (1.702 m)  Weight: 212 lb 3.2 oz (96.253 kg)   Body mass index is 33.23 kg/(m^2). Ideal Body Weight: Weight in (lb) to have BMI = 25: 159.3  GEN: WDWN, NAD, Non-toxic, A & O x 3 HEENT: Atraumatic, Normocephalic. Neck supple. No masses, No LAD.  Hoarse Ears and Nose: No external deformity. CV: RRR, No M/G/R. No JVD. No thrill. No extra heart sounds. PULM: CTA B, no wheezes, crackles, rhonchi. No retractions. No resp. distress. No accessory muscle use. ABD: Mendez, NT, ND, +BS. No rebound. No HSM. EXTR: No c/c/e NEURO Normal gait.  PSYCH: Normally interactive. Conversant. Not depressed or anxious appearing.  Calm demeanor.    Assessment and Plan: Bronchitis Laryngitis zpak Phen c cod  Signed,  Phillips OdorJeffery Camaron Cammack, MD

## 2013-10-13 NOTE — Addendum Note (Signed)
Addended by: Johnnette LitterARDWELL, Delana Manganello M on: 10/13/2013 08:46 PM   Modules accepted: Orders, Medications

## 2013-10-13 NOTE — Patient Instructions (Signed)
Laryngitis At the top of your windpipe is your voice box. It is the source of your voice. Inside your voice box are 2 bands of muscles called vocal cords. When you breathe, your vocal cords are relaxed and open so that air can get into the lungs. When you decide to say something, these cords come together and vibrate. The sound from these vibrations goes into your throat and comes out through your mouth as sound. Laryngitis is an inflammation of the vocal cords that causes hoarseness, cough, loss of voice, sore throat, and dry throat. Laryngitis can be temporary (acute) or long-term (chronic). Most cases of acute laryngitis improve with time.Chronic laryngitis lasts for more than 3 weeks. CAUSES Laryngitis can often be related to excessive smoking, talking, or yelling, as well as inhalation of toxic fumes and allergies. Acute laryngitis is usually caused by a viral infection, vocal strain, measles or mumps, or bacterial infections. Chronic laryngitis is usually caused by vocal cord strain, vocal cord injury, postnasal drip, growths on the vocal cords, or acid reflux. SYMPTOMS   Cough.  Sore throat.  Dry throat. RISK FACTORS  Respiratory infections.  Exposure to irritating substances, such as cigarette smoke, excessive amounts of alcohol, stomach acids, and workplace chemicals.  Voice trauma, such as vocal cord injury from shouting or speaking too loud. DIAGNOSIS  Your cargiver will perform a physical exam. During the physical exam, your caregiver will examine your throat. The most common sign of laryngitis is hoarseness. Laryngoscopy may be necessary to confirm the diagnosis of this condition. This procedure allows your caregiver to look into the larynx. HOME CARE INSTRUCTIONS  Drink enough fluids to keep your urine clear or pale yellow.  Rest until you no longer have symptoms or as directed by your caregiver.  Breathe in moist air.  Take all medicine as directed by your  caregiver.  Do not smoke.  Talk as little as possible (this includes whispering).  Write on paper instead of talking until your voice is back to normal.  Follow up with your caregiver if your condition has not improved after 10 days. SEEK MEDICAL CARE IF:   You have trouble breathing.  You cough up blood.  You have persistent fever.  You have increasing pain.  You have difficulty swallowing. MAKE SURE YOU:  Understand these instructions.  Will watch your condition.  Will get help right away if you are not doing well or get worse. Document Released: 05/22/2005 Document Revised: 08/14/2011 Document Reviewed: 07/28/2010 ExitCare Patient Information 2014 [employer].  

## 2014-07-06 ENCOUNTER — Ambulatory Visit (INDEPENDENT_AMBULATORY_CARE_PROVIDER_SITE_OTHER): Payer: Managed Care, Other (non HMO) | Admitting: Physician Assistant

## 2014-07-06 VITALS — BP 120/82 | HR 66 | Temp 97.7°F | Resp 20 | Ht 68.0 in | Wt 220.2 lb

## 2014-07-06 DIAGNOSIS — R070 Pain in throat: Secondary | ICD-10-CM

## 2014-07-06 DIAGNOSIS — J069 Acute upper respiratory infection, unspecified: Secondary | ICD-10-CM

## 2014-07-06 DIAGNOSIS — R0981 Nasal congestion: Secondary | ICD-10-CM

## 2014-07-06 DIAGNOSIS — B9689 Other specified bacterial agents as the cause of diseases classified elsewhere: Secondary | ICD-10-CM

## 2014-07-06 MED ORDER — AMOXICILLIN 875 MG PO TABS: 875.0000 mg | ORAL_TABLET | Freq: Two times a day (BID) | ORAL | Status: AC

## 2014-07-06 MED ORDER — MAGIC MOUTHWASH W/LIDOCAINE: 5.0000 mL | Freq: Four times a day (QID) | ORAL | Status: DC | PRN

## 2014-07-06 MED ORDER — AMOXICILLIN 875 MG PO TABS: 875.0000 mg | ORAL_TABLET | Freq: Two times a day (BID) | ORAL | Status: DC

## 2014-07-06 MED ORDER — MUCINEX DM MAXIMUM STRENGTH 60-1200 MG PO TB12: 1.0000 | ORAL_TABLET | Freq: Two times a day (BID) | ORAL | Status: AC

## 2014-07-06 MED ORDER — MUCINEX DM MAXIMUM STRENGTH 60-1200 MG PO TB12: 1.0000 | ORAL_TABLET | Freq: Two times a day (BID) | ORAL | Status: DC

## 2014-07-06 NOTE — Progress Notes (Signed)
MRN: [id number] DOB: 01/30/1966  Subjective:   Chief Complaint  Patient presents with  . Sore Throat    ears stopped up, cough that is dry,body aches, fever x 1 week.      Olivia Mendez is a 49 y.o. female presenting for 5 days of sore throat.  She has body aches and fatigue.  Thie body ache and fatigue improved, but the sorethroat feels like it is getting worse over the last day.  Patient has hoarseness that began today.  She has non-productive cough.  She has nasal congestion.  She blows out green mucus and what she describes as "brain matter" in appearance.  She also spits out green mucus.  She also has ear pain and fullness that began 1 week ago.  She denies dyspnea.  She has taken tussin guanifesin, tylenol which helped a little.  Tea with honey which helps short term.  Ear pain bilaterally.  Last week, she had abdominal pain and diarrhea, but this has since resolved.  Husband is sick contact.    Olivia Mendez has a current medication list which includes the following prescription(s): levothyroxine and liothyronine.  She is allergic to vicodin.  Olivia Mendez  has a past medical history of Thyroid disease. Also  has past surgical history that includes Appendectomy and Cholecystectomy.  ROS As in subjective.  Objective:   Vitals: BP 120/82 mmHg  Pulse 66  Temp(Src) 97.7 F (36.5 C) (Oral)  Resp 20  Ht 5\' 8"  (1.727 m)  Wt 220 lb 4 oz (99.905 kg)  BMI 33.50 kg/m2  SpO2 98%  LMP 10/03/2008  Physical Exam  Constitutional: She is oriented to person, place, and time and well-developed, well-nourished, and in no distress. No distress.  HENT:  Head: Normocephalic and atraumatic.  Right Ear: Tympanic membrane, external ear and ear canal normal.  Left Ear: Tympanic membrane, external ear and ear canal normal.  Nose: Mucosal edema (mild edema) and rhinorrhea present. Right sinus exhibits no maxillary sinus tenderness and no frontal sinus tenderness. Left sinus exhibits no maxillary sinus  tenderness and no frontal sinus tenderness.  Mouth/Throat: No trismus in the jaw. Uvula swelling present. Posterior oropharyngeal erythema (mild ) present. No oropharyngeal exudate or posterior oropharyngeal edema.  Eyes: Conjunctivae are normal. Pupils are equal, round, and reactive to light. Right eye exhibits no discharge. Left eye exhibits no discharge.  Neck: Normal range of motion. Neck supple.  Cardiovascular: Normal rate, regular rhythm and normal heart sounds.   Pulmonary/Chest: Effort normal and breath sounds normal. No respiratory distress. She has no wheezes.  Lymphadenopathy:    She has no cervical adenopathy (Tender with palpation down the anterior cervical.  ).  Neurological: She is alert and oriented to person, place, and time.  Psychiatric: Mood and affect normal.     Assessment and Plan :  49 year old female with chief complaint of sorethroat, body aches, and ear fullness.  Only one week of symptoms without much present on physical exam.  But given hx of progressive worsening again, as well as green mucus, will treat as URI, possible sinus infection of bacterial etiology.    Bacterial upper respiratory infection  amoxicillin (AMOXIL) 875 MG tablet BID 10 days, will contact in 48 hrs for patient improvement  Throat pain - Plan: Alum & Mag Hydroxide-Simeth (MAGIC MOUTHWASH W/LIDOCAINE) SOLN  Nasal congestion - Plan: Dextromethorphan-Guaifenesin (MUCINEX DM MAXIMUM STRENGTH) 60-1200 MG TB12,   Trena PlattStephanie English, PA-C [facility] 2/1/20169:07  PM       

## 2014-07-06 NOTE — Patient Instructions (Signed)
Upper Respiratory Infection, Adult An upper respiratory infection (URI) is also sometimes known as the common cold. The upper respiratory tract includes the nose, sinuses, throat, trachea, and bronchi. Bronchi are the airways leading to the lungs. Most people improve within 1 week, but symptoms can last up to 2 weeks. A residual cough may last even longer.  CAUSES Many different viruses can infect the tissues lining the upper respiratory tract. The tissues become irritated and inflamed and often become very moist. Mucus production is also common. A cold is contagious. You can easily spread the virus to others by oral contact. This includes kissing, sharing a glass, coughing, or sneezing. Touching your mouth or nose and then touching a surface, which is then touched by another person, can also spread the virus. SYMPTOMS  Symptoms typically develop 1 to 3 days after you come in contact with a cold virus. Symptoms vary from person to person. They may include:  Runny nose.  Sneezing.  Nasal congestion.  Sinus irritation.  Sore throat.  Loss of voice (laryngitis).  Cough.  Fatigue.  Muscle aches.  Loss of appetite.  Headache.  Low-grade fever. DIAGNOSIS  You might diagnose your own cold based on familiar symptoms, since most people get a cold 2 to 3 times a year. Your caregiver can confirm this based on your exam. Most importantly, your caregiver can check that your symptoms are not due to another disease such as strep throat, sinusitis, pneumonia, asthma, or epiglottitis. Blood tests, throat tests, and X-rays are not necessary to diagnose a common cold, but they may sometimes be helpful in excluding other more serious diseases. Your caregiver will decide if any further tests are required. RISKS AND COMPLICATIONS  You may be at risk for a more severe case of the common cold if you smoke cigarettes, have chronic heart disease (such as heart failure) or lung disease (such as asthma), or if  you have a weakened immune system. The very young and very old are also at risk for more serious infections. Bacterial sinusitis, middle ear infections, and bacterial pneumonia can complicate the common cold. The common cold can worsen asthma and chronic obstructive pulmonary disease (COPD). Sometimes, these complications can require emergency medical care and may be life-threatening. PREVENTION  The best way to protect against getting a cold is to practice good hygiene. Avoid oral or hand contact with people with cold symptoms. Wash your hands often if contact occurs. There is no clear evidence that vitamin C, vitamin E, echinacea, or exercise reduces the chance of developing a cold. However, it is always recommended to get plenty of rest and practice good nutrition. TREATMENT  Treatment is directed at relieving symptoms. There is no cure. Antibiotics are not effective, because the infection is caused by a virus, not by bacteria. Treatment may include:  Increased fluid intake. Sports drinks offer valuable electrolytes, sugars, and fluids.  Breathing heated mist or steam (vaporizer or shower).  Eating chicken soup or other clear broths, and maintaining good nutrition.  Getting plenty of rest.  Using gargles or lozenges for comfort.  Controlling fevers with ibuprofen or acetaminophen as directed by your caregiver.  Increasing usage of your inhaler if you have asthma. Zinc gel and zinc lozenges, taken in the first 24 hours of the common cold, can shorten the duration and lessen the severity of symptoms. Pain medicines may help with fever, muscle aches, and throat pain. A variety of non-prescription medicines are available to treat congestion and runny nose. Your caregiver   can make recommendations and may suggest nasal or lung inhalers for other symptoms.  HOME CARE INSTRUCTIONS   Only take over-the-counter or prescription medicines for pain, discomfort, or fever as directed by your  caregiver.  Use a warm mist humidifier or inhale steam from a shower to increase air moisture. This may keep secretions moist and make it easier to breathe.  Drink enough water and fluids to keep your urine clear or pale yellow.  Rest as needed.  Return to work when your temperature has returned to normal or as your caregiver advises. You may need to stay home longer to avoid infecting others. You can also use a face mask and careful hand washing to prevent spread of the virus. SEEK MEDICAL CARE IF:   After the first few days, you feel you are getting worse rather than better.  You need your caregiver's advice about medicines to control symptoms.  You develop chills, worsening shortness of breath, or brown or red sputum. These may be signs of pneumonia.  You develop yellow or brown nasal discharge or pain in the face, especially when you bend forward. These may be signs of sinusitis.  You develop a fever, swollen neck glands, pain with swallowing, or white areas in the back of your throat. These may be signs of strep throat. SEEK IMMEDIATE MEDICAL CARE IF:   You have a fever.  You develop severe or persistent headache, ear pain, sinus pain, or chest pain.  You develop wheezing, a prolonged cough, cough up blood, or have a change in your usual mucus (if you have chronic lung disease).  You develop sore muscles or a stiff neck. Document Released: 11/15/2000 Document Revised: 08/14/2011 Document Reviewed: 08/27/2013 Exit[employer] Patient Information 2015 [employer]. This information is not intended to replace advice given to you by your health care provider. Make sure you discuss any questions you have with your health care provider.  

## 2014-11-13 ENCOUNTER — Ambulatory Visit (INDEPENDENT_AMBULATORY_CARE_PROVIDER_SITE_OTHER): Payer: Managed Care, Other (non HMO) | Admitting: Family

## 2014-11-13 ENCOUNTER — Other Ambulatory Visit (INDEPENDENT_AMBULATORY_CARE_PROVIDER_SITE_OTHER): Payer: Managed Care, Other (non HMO)

## 2014-11-13 ENCOUNTER — Encounter: Payer: Self-pay | Admitting: Women's Health

## 2014-11-13 ENCOUNTER — Ambulatory Visit (INDEPENDENT_AMBULATORY_CARE_PROVIDER_SITE_OTHER): Payer: Managed Care, Other (non HMO) | Admitting: Women's Health

## 2014-11-13 ENCOUNTER — Other Ambulatory Visit (HOSPITAL_COMMUNITY)
Admission: RE | Admit: 2014-11-13 | Discharge: 2014-11-13 | Disposition: A | Discharge status: Home / Self Care | Payer: Managed Care, Other (non HMO) | Source: Ambulatory Visit | Attending: Women's Health | Admitting: Women's Health

## 2014-11-13 ENCOUNTER — Encounter: Payer: Self-pay | Admitting: Family

## 2014-11-13 VITALS — BP 110/80 | HR 71 | Temp 98.0°F | Resp 18 | Ht 67.0 in | Wt 222.8 lb

## 2014-11-13 VITALS — BP 112/79

## 2014-11-13 DIAGNOSIS — R600 Localized edema: Secondary | ICD-10-CM

## 2014-11-13 DIAGNOSIS — T148 Other injury of unspecified body region: Secondary | ICD-10-CM | POA: Diagnosis not present

## 2014-11-13 DIAGNOSIS — Z01419 Encounter for gynecological examination (general) (routine) without abnormal findings: Secondary | ICD-10-CM

## 2014-11-13 DIAGNOSIS — Z1151 Encounter for screening for human papillomavirus (HPV): Secondary | ICD-10-CM | POA: Diagnosis present

## 2014-11-13 DIAGNOSIS — R5383 Other fatigue: Secondary | ICD-10-CM

## 2014-11-13 DIAGNOSIS — W57XXXA Bitten or stung by nonvenomous insect and other nonvenomous arthropods, initial encounter: Secondary | ICD-10-CM | POA: Insufficient documentation

## 2014-11-13 LAB — COMPREHENSIVE METABOLIC PANEL
ALT: 46 U/L — ABNORMAL HIGH (ref 0–35)
AST: 29 U/L (ref 0–37)
Albumin: 4.2 g/dL (ref 3.5–5.2)
Alkaline Phosphatase: 63 U/L (ref 39–117)
BUN: 12 mg/dL (ref 6–23)
CALCIUM: 10.2 mg/dL (ref 8.4–10.5)
CHLORIDE: 104 meq/L (ref 96–112)
CO2: 28 mEq/L (ref 19–32)
Creatinine, Ser: 0.73 mg/dL (ref 0.40–1.20)
GFR: 89.57 mL/min (ref >60.00)
Glucose, Bld: 88 mg/dL (ref 70–99)
Potassium: 4.6 mEq/L (ref 3.5–5.1)
SODIUM: 138 meq/L (ref 135–145)
TOTAL PROTEIN: 7.1 g/dL (ref 6.0–8.3)
Total Bilirubin: 0.7 mg/dL (ref 0.2–1.2)

## 2014-11-13 LAB — CBC
HEMATOCRIT: 41.4 % (ref 36.0–46.0)
HEMOGLOBIN: 14.1 g/dL (ref 12.0–15.0)
MCHC: 34.2 g/dL (ref 30.0–36.0)
MCV: 94 fl (ref 78.0–100.0)
Platelets: 314 10*3/uL (ref 150.0–400.0)
RBC: 4.4 Mil/uL (ref 3.87–5.11)
RDW: 12.9 % (ref 11.5–15.5)
WBC: 6.9 10*3/uL (ref 4.0–10.5)

## 2014-11-13 LAB — B12 AND FOLATE PANEL
Folate: >24.8 ng/mL (ref >5.9)
VITAMIN B 12: 391 pg/mL (ref 211–911)

## 2014-11-13 LAB — IBC PANEL
Iron: 75 ug/dL (ref 42–145)
Saturation Ratios: 16.1 % — ABNORMAL LOW (ref 20.0–50.0)
Transferrin: 333 mg/dL (ref 212.0–360.0)

## 2014-11-13 LAB — HEMOGLOBIN A1C: Hgb A1c MFr Bld: 5.5 % (ref 4.6–6.5)

## 2014-11-13 LAB — TSH: TSH: 2.12 u[IU]/mL (ref 0.35–4.50)

## 2014-11-13 NOTE — Assessment & Plan Note (Signed)
2 small tick bites noted with no evidence of rashes. No symptoms of fevers or joint pains noted. Unlikely to be [facility] spotted fever or Lyme disease. Continue to monitor at this time. Follow-up if symptoms begin to develop. Information provided to patient regarding both conditions.

## 2014-11-13 NOTE — Assessment & Plan Note (Signed)
Lower extremity edema of undetermined origin. Notes to be improving slowly. Check complete metabolic panel to rule out kidney and liver function as potential pathology. Treat conservatively at this time with leg elevation, decrease in salt, plenty of fluids, and compression socks as needed. Follow-up pending lab work.

## 2014-11-13 NOTE — Patient Instructions (Signed)
Health Recommendations for Postmenopausal Women Respected and ongoing research has looked at the most common causes of death, disability, and poor quality of life in postmenopausal women. The causes include heart disease, diseases of blood vessels, diabetes, depression, cancer, and bone loss (osteoporosis). Many things can be done to help lower the chances of developing these and other common problems. CARDIOVASCULAR DISEASE Heart Disease: A heart attack is a medical emergency. Know the signs and symptoms of a heart attack. Below are things women can do to reduce their risk for heart disease.   Do not smoke. If you smoke, quit.  Aim for a healthy weight. Being overweight causes many preventable deaths. Eat a healthy and balanced diet and drink an adequate amount of liquids.  Get moving. Make a commitment to be more physically active. Aim for 30 minutes of activity on most, if not all days of the week.  Eat for heart health. Choose a diet that is low in saturated fat and cholesterol and eliminate trans fat. Include whole grains, vegetables, and fruits. Read and understand the labels on food containers before buying.  Know your numbers. Ask your caregiver to check your blood pressure, cholesterol (total, HDL, LDL, triglycerides) and blood glucose. Work with your caregiver on improving your entire clinical picture.  High blood pressure. Limit or stop your table salt intake (try salt substitute and food seasonings). Avoid salty foods and drinks. Read labels on food containers before buying. Eating well and exercising can help control high blood pressure. STROKE  Stroke is a medical emergency. Stroke may be the result of a blood clot in a blood vessel in the brain or by a brain hemorrhage (bleeding). Know the signs and symptoms of a stroke. To lower the risk of developing a stroke:  Avoid fatty foods.  Quit smoking.  Control your diabetes, blood pressure, and irregular heart rate. THROMBOPHLEBITIS  (BLOOD CLOT) OF THE LEG  Becoming overweight and leading a stationary lifestyle may also contribute to developing blood clots. Controlling your diet and exercising will help lower the risk of developing blood clots. CANCER SCREENING  Breast Cancer: Take steps to reduce your risk of breast cancer.  You should practice "breast self-awareness." This means understanding the normal appearance and feel of your breasts and should include breast self-examination. Any changes detected, no matter how small, should be reported to your caregiver.  After age 40, you should have a clinical breast exam (CBE) every year.  Starting at age 40, you should consider having a mammogram (breast X-ray) every year.  If you have a family history of breast cancer, talk to your caregiver about genetic screening.  If you are at high risk for breast cancer, talk to your caregiver about having an MRI and a mammogram every year.  Intestinal or Stomach Cancer: Tests to consider are a rectal exam, fecal occult blood, sigmoidoscopy, and colonoscopy. Women who are high risk may need to be screened at an earlier age and more often.  Cervical Cancer:  Beginning at age 30, you should have a Pap test every 3 years as long as the past 3 Pap tests have been normal.  If you have had past treatment for cervical cancer or a condition that could lead to cancer, you need Pap tests and screening for cancer for at least 20 years after your treatment.  If you had a hysterectomy for a problem that was not cancer or a condition that could lead to cancer, then you no longer need Pap tests.    If you are between ages 65 and 70, and you have had normal Pap tests going back 10 years, you no longer need Pap tests.  If Pap tests have been discontinued, risk factors (such as a new sexual partner) need to be reassessed to determine if screening should be resumed.  Some medical problems can increase the chance of getting cervical cancer. In these  cases, your caregiver may recommend more frequent screening and Pap tests.  Uterine Cancer: If you have vaginal bleeding after reaching menopause, you should notify your caregiver.  Ovarian Cancer: Other than yearly pelvic exams, there are no reliable tests available to screen for ovarian cancer at this time except for yearly pelvic exams.  Lung Cancer: Yearly chest X-rays can detect lung cancer and should be done on high risk women, such as cigarette smokers and women with chronic lung disease (emphysema).  Skin Cancer: A complete body skin exam should be done at your yearly examination. Avoid overexposure to the sun and ultraviolet light lamps. Use a strong sun block cream when in the sun. All of these things are important for lowering the risk of skin cancer. MENOPAUSE Menopause Symptoms: Hormone therapy products are effective for treating symptoms associated with menopause:  Moderate to severe hot flashes.  Night sweats.  Mood swings.  Headaches.  Tiredness.  Loss of sex drive.  Insomnia.  Other symptoms. Hormone replacement carries certain risks, especially in older women. Women who use or are thinking about using estrogen or estrogen with progestin treatments should discuss that with their caregiver. Your caregiver will help you understand the benefits and risks. The ideal dose of hormone replacement therapy is not known. The Food and Drug Administration (FDA) has concluded that hormone therapy should be used only at the lowest doses and for the shortest amount of time to reach treatment goals.  OSTEOPOROSIS Protecting Against Bone Loss and Preventing Fracture If you use hormone therapy for prevention of bone loss (osteoporosis), the risks for bone loss must outweigh the risk of the therapy. Ask your caregiver about other medications known to be safe and effective for preventing bone loss and fractures. To guard against bone loss or fractures, the following is recommended:  If  you are younger than age 50, take 1000 mg of calcium and at least 600 mg of Vitamin D per day.  If you are older than age 50 but younger than age 70, take 1200 mg of calcium and at least 600 mg of Vitamin D per day.  If you are older than age 70, take 1200 mg of calcium and at least 800 mg of Vitamin D per day. Smoking and excessive alcohol intake increases the risk of osteoporosis. Eat foods rich in calcium and vitamin D and do weight bearing exercises several times a week as your caregiver suggests. DIABETES Diabetes Mellitus: If you have type I or type 2 diabetes, you should keep your blood sugar under control with diet, exercise, and recommended medication. Avoid starchy and fatty foods, and too many sweets. Being overweight can make diabetes control more difficult. COGNITION AND MEMORY Cognition and Memory: Menopausal hormone therapy is not recommended for the prevention of cognitive disorders such as Alzheimer's disease or memory loss.  DEPRESSION  Depression may occur at any age, but it is common in elderly women. This may be because of physical, medical, social (loneliness), or financial problems and needs. If you are experiencing depression because of medical problems and control of symptoms, talk to your caregiver about this. Physical   activity and exercise may help with mood and sleep. Community and volunteer involvement may improve your sense of value and worth. If you have depression and you feel that the problem is getting worse or becoming severe, talk to your caregiver about which treatment options are best for you. ACCIDENTS  Accidents are common and can be serious in elderly woman. Prepare your house to prevent accidents. Eliminate throw rugs, place hand bars in bath, shower, and toilet areas. Avoid wearing high heeled shoes or walking on wet, snowy, and icy areas. Limit or stop driving if you have vision or hearing problems, or if you feel you are unsteady with your movements and  reflexes. HEPATITIS C Hepatitis C is a type of viral infection affecting the liver. It is spread mainly through contact with blood from an infected person. It can be treated, but if left untreated, it can lead to severe liver damage over the years. Many people who are infected do not know that the virus is in their blood. If you are a "baby-boomer", it is recommended that you have one screening test for Hepatitis C. IMMUNIZATIONS  Several immunizations are important to consider having during your senior years, including:   Tetanus, diphtheria, and pertussis booster shot.  Influenza every year before the flu season begins.  Pneumonia vaccine.  Shingles vaccine.  Others, as indicated based on your specific needs. Talk to your caregiver about these. Document Released: 07/14/2005 Document Revised: 10/06/2013 Document Reviewed: 03/09/2008 [employer] Patient Information 2015 [employer]. This information is not intended to replace advice given to you by your health care provider. Make sure you discuss any questions you have with your health care provider.  

## 2014-11-13 NOTE — Progress Notes (Signed)
Olivia Mendez 50-28-1967 [id number]    History:    Presents for annual exam.  Postmenopausal/no HRT/no bleeding. History of infertility. 2003 endometrioma. Hypothyroid Dr. Talmage Nap manages. Primary care manages labs. Normal DEXA. Overdue years for mammogram. Normal Pap history. 2010 negative colonoscopy, mild diverticulosis, GERD.  Past medical history, past surgical history, family history and social history were all reviewed and documented in the EPIC chart. Owns a [employer].  ROS:  A ROS was performed and pertinent positives and negatives are included.  Exam:  Filed Vitals:   11/13/14 1618  BP: 112/79    General appearance:  Normal Thyroid:  Symmetrical, normal in size, without palpable masses or nodularity. Respiratory  Auscultation:  Clear without wheezing or rhonchi Cardiovascular  Auscultation:  Regular rate, without rubs, murmurs or gallops  Edema/varicosities:  Not grossly evident Abdominal  Soft,nontender, without masses, guarding or rebound.  Liver/spleen:  No organomegaly noted  Hernia:  None appreciated  Skin  Inspection:  Grossly normal   Breasts: Examined lying and sitting.     Right: Without masses, retractions, discharge or axillary adenopathy.     Left: Without masses, retractions, discharge or axillary adenopathy. Gentitourinary   Inguinal/mons:  Normal without inguinal adenopathy  External genitalia:  Normal  BUS/Urethra/Skene's glands:  Normal  Vagina:  Normal  Cervix:  Normal  Uterus:   normal in size, shape and contour.  Midline and mobile  Adnexa/parametria:     Rt: Without masses or tenderness.   Lt: Without masses or tenderness.  Anus and perineum: Normal  Digital rectal exam: Normal sphincter tone without palpated masses or tenderness  Assessment/Plan:  49 y.o. MWF G0 for annual exam.  Postmenopausal/no HRT/no bleeding Hypothyroid Dr. Talmage Nap manages Obesity GERD-primary care manages labs  Plan: SBE's, breast center information given,  strongly encouraged to schedule ASAP last mammogram 2007. Increase regular exercise and decrease calories for weight loss. Reviewed weight loss would also help GERD. UA, Pap with HR HPV typing, new screening guidelines reviewed.    [facility], 4:50 PM 11/13/2014

## 2014-11-13 NOTE — Patient Instructions (Signed)
Thank you for choosing [facility].  Summary/Instructions:  Please continue drinking plenty of fluids, decrease the salt in her diet if able, elevate her feet, and compression socks as needed.  Please stop by the lab on the basement level of the building for your blood work. Your results will be released to MyChart (or called to you) after review, usually within 72 hours after test completion. If any changes need to be made, you will be notified at that same time.  If your symptoms worsen or fail to improve, please contact our office for further instruction, or in case of emergency go directly to the emergency room at the closest medical facility.   Tick Bite Information Ticks are insects that attach themselves to the skin and draw blood for food. There are various types of ticks. Common types include wood ticks and deer ticks. Most ticks live in shrubs and grassy areas. Ticks can climb onto your body when you make contact with leaves or grass where the tick is waiting. The most common places on the body for ticks to attach themselves are the scalp, neck, armpits, waist, and groin. Most tick bites are harmless, but sometimes ticks carry germs that cause diseases. These germs can be spread to a person during the tick's feeding process. The chance of a disease spreading through a tick bite depends on:   The type of tick.  Time of year.   How long the tick is attached.   Geographic location.  HOW CAN YOU PREVENT TICK BITES? Take these steps to help prevent tick bites when you are outdoors:  Wear protective clothing. Long sleeves and long pants are best.   Wear white clothes so you can see ticks more easily.  Tuck your pant legs into your socks.   If walking on a trail, stay in the middle of the trail to avoid brushing against bushes.  Avoid walking through areas with long grass.  Put insect repellent on all exposed skin and along boot tops, pant legs, and sleeve cuffs.    Check clothing, hair, and skin repeatedly and before going inside.   Brush off any ticks that are not attached.  Take a shower or bath as soon as possible after being outdoors.  WHAT IS THE PROPER WAY TO REMOVE A TICK? Ticks should be removed as soon as possible to help prevent diseases caused by tick bites.  If latex gloves are available, put them on before trying to remove a tick.   Using fine-point tweezers, grasp the tick as close to the skin as possible. You may also use curved forceps or a tick removal tool. Grasp the tick as close to its head as possible. Avoid grasping the tick on its body.  Pull gently with steady upward pressure until the tick lets go. Do not twist the tick or jerk it suddenly. This may break off the tick's head or mouth parts.  Do not squeeze or crush the tick's body. This could force disease-carrying fluids from the tick into your body.   After the tick is removed, wash the bite area and your hands with soap and water or other disinfectant such as alcohol.  Apply a small amount of antiseptic cream or ointment to the bite site.   Wash and disinfect any instruments that were used.  Do not try to remove a tick by applying a hot match, petroleum jelly, or fingernail polish to the tick. These methods do not work and may increase the chances of  disease being spread from the tick bite.  WHEN SHOULD YOU SEEK MEDICAL CARE? Contact your health care provider if you are unable to remove a tick from your skin or if a part of the tick breaks off and is stuck in the skin.  After a tick bite, you need to be aware of signs and symptoms that could be related to diseases spread by ticks. Contact your health care provider if you develop any of the following in the days or weeks after the tick bite:  Unexplained fever.  Rash. A circular rash that appears days or weeks after the tick bite may indicate the possibility of Lyme disease. The rash may resemble a target with  a bull's-eye and may occur at a different part of your body than the tick bite.  Redness and swelling in the area of the tick bite.   Tender, swollen lymph glands.   Diarrhea.   Weight loss.   Cough.   Fatigue.   Muscle, joint, or bone pain.   Abdominal pain.   Headache.   Lethargy or a change in your level of consciousness.  Difficulty walking or moving your legs.   Numbness in the legs.   Paralysis.  Shortness of breath.   Confusion.   Repeated vomiting.  Document Released: 05/19/2000 Document Revised: 03/12/2013 Document Reviewed: 10/30/2012 [facility] Patient Information 2015 [place], [place]. This information is not intended to replace advice given to you by your health care provider. Make sure you discuss any questions you have with your health care provider.  Edema Edema is an abnormal buildup of fluids in your bodytissues. Edema is somewhatdependent on gravity to pull the fluid to the lowest place in your body. That makes the condition more common in the legs and thighs (lower extremities). Painless swelling of the feet and ankles is common and becomes more likely as you get older. It is also common in looser tissues, like around your eyes.  When the affected area is squeezed, the fluid may move out of that spot and leave a dent for a few moments. This dent is called pitting.  CAUSES  There are many possible causes of edema. Eating too much salt and being on your feet or sitting for a long time can cause edema in your legs and ankles. Hot weather may make edema worse. Common medical causes of edema include:  Heart failure.  Liver disease.  Kidney disease.  Weak blood vessels in your legs.  Cancer.  An injury.  Pregnancy.  Some medications.  Obesity. SYMPTOMS  Edema is usually painless.Your skin may look swollen or shiny.  DIAGNOSIS  Your health care provider may be able to diagnose edema by asking about your medical history and  doing a physical exam. You may need to have tests such as X-rays, an electrocardiogram, or blood tests to check for medical conditions that may cause edema.  TREATMENT  Edema treatment depends on the cause. If you have heart, liver, or kidney disease, you need the treatment appropriate for these conditions. General treatment may include:  Elevation of the affected body part above the level of your heart.  Compression of the affected body part. Pressure from elastic bandages or support stockings squeezes the tissues and forces fluid back into the blood vessels. This keeps fluid from entering the tissues.  Restriction of fluid and salt intake.  Use of a water pill (diuretic). These medications are appropriate only for some types of edema. They pull fluid out of your body and  make you urinate more often. This gets rid of fluid and reduces swelling, but diuretics can have side effects. Only use diuretics as directed by your health care provider. HOME CARE INSTRUCTIONS   Keep the affected body part above the level of your heart when you are lying down.   Do not sit still or stand for prolonged periods.   Do not put anything directly under your knees when lying down.  Do not wear constricting clothing or garters on your upper legs.   Exercise your legs to work the fluid back into your blood vessels. This may help the swelling go down.   Wear elastic bandages or support stockings to reduce ankle swelling as directed by your health care provider.   Eat a low-salt diet to reduce fluid if your health care provider recommends it.   Only take medicines as directed by your health care provider. SEEK MEDICAL CARE IF:   Your edema is not responding to treatment.  You have heart, liver, or kidney disease and notice symptoms of edema.  You have edema in your legs that does not improve after elevating them.   You have sudden and unexplained weight gain. SEEK IMMEDIATE MEDICAL CARE IF:    You develop shortness of breath or chest pain.   You cannot breathe when you lie down.  You develop pain, redness, or warmth in the swollen areas.   You have heart, liver, or kidney disease and suddenly get edema.  You have a fever and your symptoms suddenly get worse. MAKE SURE YOU:   Understand these instructions.  Will watch your condition.  Will get help right away if you are not doing well or get worse. Document Released: 05/22/2005 Document Revised: 10/06/2013 Document Reviewed: 03/14/2013 [facility] Patient Information 2015 [place], [place]. This information is not intended to replace advice given to you by your health care provider. Make sure you discuss any questions you have with your health care provider.

## 2014-11-13 NOTE — Progress Notes (Signed)
Pre visit review using our clinic review tool, if applicable. No additional management support is needed unless otherwise documented below in the visit note. 

## 2014-11-13 NOTE — Assessment & Plan Note (Signed)
Fatigue of undetermined origin. Obtain B12/folate, CBC, hemoglobin A1c, IBC panel, TSH, and complete metabolic panel to rule out underlying metabolic causes. Cannot rule out anxiety, depression, or cardiovascular disease. Continue taking current medications. Follow-up pending lab work.

## 2014-11-13 NOTE — Progress Notes (Signed)
Subjective:    Patient ID: Olivia Mendez, female    DOB: [birth year], 49 y.o.   MRN: [id number]  Chief Complaint  Patient presents with  . Joint Swelling    noticed the swelling tuesday, in her arms, legs, and ankles, has been having alot of different sxs like numbness and tingling in arms, did find 2 ticks on her last week    HPI:  Olivia Mendez is a 49 y.o. female with a PMH of thyroid disease who presents today to re-establish care in this office.   1.) Tick bite - Notes that she recently found 2 deer ticks attached to her stomach and back last week. Most likely only imbedded for less than 24 hours. Indicates that there is still some associated itching, however there are no other symptoms of rashes or fevers that she notes. Ticks were removed without problems.   2.) Swelling of the legs - Associated symptoms of edema located in her bilateral lower extremities has been going on for about 1 week. Notes she was standing for a long period of time. Modifying factors include drinking fluids and will occasionally elevate her legs which has helped minimally. Notes that symptoms are worse as the day goes on, and there has been some improvement overall.  3.) Fatigue - Associated symptom of fatigue has been going on for a while. Has been told by others that she looks tired and slightly pale. Denies any modifying factors that make it better or worse. She does take several doses of B vitamins daily which does not really resolve her fatigue.  Allergies  Allergen Reactions  . Vicodin [Hydrocodone-Acetaminophen]      Outpatient Prescriptions Prior to Visit  Medication Sig Dispense Refill  . levothyroxine (SYNTHROID, LEVOTHROID) 150 MCG tablet Take 150 mcg by mouth daily.    [name] liothyronine (CYTOMEL) 5 MCG tablet Take 5 mcg by mouth daily.    . Alum & Mag Hydroxide-Simeth (MAGIC MOUTHWASH W/LIDOCAINE) SOLN Take 5 mLs by mouth 4 (four) times daily as needed for mouth pain. 100 mL 0   No  facility-administered medications prior to visit.     Past Medical History  Diagnosis Date  . Thyroid disease      Past Surgical History  Procedure Laterality Date  . Appendectomy    . Cholecystectomy       Family History  Problem Relation Age of Onset  . Diabetes Mother   . Multiple sclerosis Maternal Grandmother   . Heart disease Maternal Grandfather   . Cancer Paternal Grandmother     melanoma  . Heart disease Paternal Grandfather   . Healthy Father      History   Social History  . Marital Status: Married    Spouse Name: N/A  . Number of Children: N/A  . Years of Education: N/A   Occupational History  . Not on file.   Social History Main Topics  . Smoking status: Never Smoker   . Smokeless tobacco: Never Used  . Alcohol Use: 0.0 oz/week    0 Standard drinks or equivalent per week     Comment: socially rare  . Drug Use: No  . Sexual Activity: Yes    Birth Control/ Protection: Other-see comments     Comment: husband vasectomy   Other Topics Concern  . Not on file   Social History Narrative    Review of Systems  Constitutional: Positive for fatigue. Negative for fever and chills.  Respiratory: Positive for chest tightness. Negative for  cough and shortness of breath.   Cardiovascular: Positive for leg swelling. Negative for chest pain and palpitations.  Skin: Negative for rash.  Neurological: Positive for numbness.      Objective:    BP 110/80 mmHg  Pulse 71  Temp(Src) 98 F (36.7 C) (Oral)  Resp 18  Ht 5' 7"  (1.702 m)  Wt 222 lb 12.8 oz (101.061 kg)  BMI 34.89 kg/m2  SpO2 96%  LMP 10/03/2008 Nursing note and vital signs reviewed.  Physical Exam  Constitutional: She is oriented to person, place, and time. She appears well-developed and well-nourished. No distress.  Cardiovascular: Normal rate, regular rhythm, normal heart sounds and intact distal pulses.   Mild nonpitting edema noted bilaterally.  Pulmonary/Chest: Effort normal and  breath sounds normal.  Neurological: She is alert and oriented to person, place, and time.  Skin: Skin is warm and dry.  Small half centimeter tic bite noted on abdomen and mid back. No erythemia migrans or other rashes noted.   Psychiatric: She has a normal mood and affect. Her behavior is normal. Judgment and thought content normal.       Assessment & Plan:    Problem List Items Addressed This Visit      Musculoskeletal and Integument   Tick bite    2 small tick bites noted with no evidence of rashes. No symptoms of fevers or joint pains noted. Unlikely to be [facility] spotted fever or Lyme disease. Continue to monitor at this time. Follow-up if symptoms begin to develop. Information provided to patient regarding both conditions.        Other   Lower leg edema - Primary    Lower extremity edema of undetermined origin. Notes to be improving slowly. Check complete metabolic panel to rule out kidney and liver function as potential pathology. Treat conservatively at this time with leg elevation, decrease in salt, plenty of fluids, and compression socks as needed. Follow-up pending lab work.      Relevant Orders   Comp Met (CMET)   Fatigue    Fatigue of undetermined origin. Obtain B12/folate, CBC, hemoglobin A1c, IBC panel, TSH, and complete metabolic panel to rule out underlying metabolic causes. Cannot rule out anxiety, depression, or cardiovascular disease. Continue taking current medications. Follow-up pending lab work.      Relevant Orders   B12 and Folate Panel   CBC   Hemoglobin A1c   IBC panel   TSH   Comp Met (CMET)

## 2014-11-14 ENCOUNTER — Encounter: Payer: Self-pay | Admitting: Family

## 2014-11-16 ENCOUNTER — Other Ambulatory Visit: Payer: Self-pay

## 2014-11-16 DIAGNOSIS — Z1231 Encounter for screening mammogram for malignant neoplasm of breast: Secondary | ICD-10-CM

## 2014-11-17 ENCOUNTER — Ambulatory Visit (INDEPENDENT_AMBULATORY_CARE_PROVIDER_SITE_OTHER): Payer: Managed Care, Other (non HMO) | Admitting: Family Medicine

## 2014-11-17 VITALS — BP 160/88 | HR 66 | Temp 98.4°F | Resp 17 | Ht 68.5 in | Wt 224.0 lb

## 2014-11-17 DIAGNOSIS — H9313 Tinnitus, bilateral: Secondary | ICD-10-CM

## 2014-11-17 LAB — CYTOLOGY - PAP

## 2014-11-17 MED ORDER — FLUTICASONE PROPIONATE 50 MCG/ACT NA SUSP: 2.0000 | Freq: Two times a day (BID) | NASAL | Status: DC

## 2014-11-17 MED ORDER — AMOXICILLIN-POT CLAVULANATE 875-125 MG PO TABS
1.0000 | ORAL_TABLET | Freq: Two times a day (BID) | ORAL | Status: DC
Start: 2014-11-17 — End: 2014-12-26

## 2014-11-17 NOTE — Patient Instructions (Signed)
Tinnitus °Sounds you hear in your ears and coming from within the ear is called tinnitus. This can be a symptom of many ear disorders. It is often associated with hearing loss.  °Tinnitus can be seen with: °· Infections. °· Ear blockages such as wax buildup. °· Meniere's disease. °· Ear damage. °· Inherited. °· Occupational causes. °While irritating, it is not usually a threat to health. When the cause of the tinnitus is wax, infection in the middle ear, or foreign body it is easily treated. Hearing loss will usually be reversible.  °TREATMENT  °When treating the underlying cause does not get rid of tinnitus, it may be necessary to get rid of the unwanted sound by covering it up with more pleasant background noises. This may include music, the radio etc. There are tinnitus maskers which can be worn which produce background noise to cover up the tinnitus. °Avoid all medications which tend to make tinnitus worse such as alcohol, caffeine, aspirin, and nicotine. There are many soothing background tapes such as rain, ocean, thunderstorms, etc. These soothing sounds help with sleeping or resting. °Keep all follow-up appointments and referrals. This is important to identify the cause of the problem. It also helps avoid complications, impaired hearing, disability, or chronic pain. °Document Released: 05/22/2005 Document Revised: 08/14/2011 Document Reviewed: 01/08/2008 °ExitCare® Patient Information ©2015 [employer]. This information is not intended to replace advice given to you by your health care provider. Make sure you discuss any questions you have with your health care provider. ° °

## 2014-11-17 NOTE — Progress Notes (Signed)
Patient ID: Olivia Mendez, female   DOB: Oct 10, 1965, 49 y.o.   MRN: [id number]   This chart was scribed for Elvina Sidle, MD by Olivia Mendez, medical scribe at [facility].The patient was seen in exam room 09 and the patient's care was started at 8:02 PM.  Patient ID: Olivia Mendez MRN: [id number], DOB: 12/20/1965, 49 y.o. Date of Encounter: 11/17/2014  Primary Physician: Olivia Barre, MD  Chief Complaint:  Chief Complaint  Patient presents with   Tinnitus    2 days   HPI:  Olivia Mendez is a 49 y.o. female who presents to [facility] complaining of tinnitus for the past two day. Began has low static and has gradually worsened. She has pressure in her ears, which began yesterday. No sinus congestion, or rhinorrhea. She has not used a nasal spray. Pt denies any vertigo. 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 Authorizing Provider  Bioflavonoid Products (C-COMPLEX PO) Take 1,000 mg by mouth.   Yes Historical Provider, MD  Cholecalciferol (VITAMIN D-3 PO) Take 2,000 Units by mouth.   Yes Historical Provider, MD  co-enzyme Q-10 30 MG capsule Take 200 mg by mouth 3 (three) times daily.   Yes Historical Provider, MD  levothyroxine (SYNTHROID, LEVOTHROID) 150 MCG tablet Take 150 mcg by mouth daily.   Yes Historical Provider, MD  liothyronine (CYTOMEL) 5 MCG tablet Take 5 mcg by mouth daily.   Yes Historical Provider, MD  Omega-3 Fatty Acids (FISH OIL) 1360 MG CAPS Take 1,600 mg by mouth.   Yes Historical Provider, MD   Allergies:  Allergies  Allergen Reactions   Vicodin [Hydrocodone-Acetaminophen]    History   Social History   Marital Status: Married    Spouse Name: N/A   Number of Children: 0   Years of Education: 16   Occupational History   Warden/ranger    Social History Main  Topics   Smoking status: Never Smoker    Smokeless tobacco: Never Used   Alcohol Use: 0.0 oz/week    0 Standard drinks or equivalent per week     Comment: socially rare   Drug Use: No   Sexual Activity: Yes    Birth Control/ Protection: Other-see comments     Comment: husband vasectomy   Other Topics Concern   Not on file   Social History Narrative   Fun: Garden, read   Denies religious beliefs effecting health care.     Review of Systems: Constitutional: negative for chills, fever, night sweats, weight changes, or fatigue  HEENT: negative for vision changes, hearing loss, congestion, rhinorrhea, ST, epistaxis, or sinus pressure. Positive for ear ringing.  Cardiovascular: negative for chest pain or palpitations Respiratory: negative for hemoptysis, wheezing, shortness of breath, or cough Abdominal: negative for abdominal pain, nausea, vomiting, diarrhea, or constipation Dermatological: negative for rash Neurologic: negative for headache, dizziness, or syncope All other systems reviewed and are otherwise negative with the exception to those above and in the HPI.  Physical Exam: Blood pressure 160/88, pulse 66, temperature 98.4 F (36.9 C), temperature source Oral, resp. rate 17, height 5' 8.5" (1.74 m), weight 224 lb (101.606 kg), last menstrual period 10/03/2008, SpO2 97 %., Body mass index is 33.56 kg/(m^2). General: Well developed, well nourished, in no acute distress. Head: Normocephalic,  atraumatic, eyes without discharge, sclera non-icteric, nares are without discharge. Oral cavity moist, posterior pharynx without exudate, erythema, peritonsillar abscess, or Mendez nasal drip. Ear drums are convex with a small meniscus of fluid behind each ear drum.   Neck: Supple. No thyromegaly. Full ROM. No lymphadenopathy. Lungs: Clear bilaterally to auscultation without wheezes, rales, or rhonchi. Breathing is unlabored. Heart: RRR with S1 S2. No murmurs, rubs, or gallops  appreciated. Abdomen: Soft, non-tender, non-distended with normoactive bowel sounds. No hepatomegaly. No rebound/guarding. No obvious abdominal masses. Msk:  Strength and tone normal for age. Extremities/Skin: Warm and dry. No clubbing or cyanosis. No edema. No rashes or suspicious lesions. Neuro: Alert and oriented X 3. Moves all extremities spontaneously. Gait is normal. CNII-XII grossly in tact. Psych:  Responds to questions appropriately with a normal affect.    ASSESSMENT AND PLAN:  49 y.o. year old female with   This chart was scribed in my presence and reviewed by me personally.    ICD-9-CM ICD-10-CM   1. Tinnitus, bilateral 388.30 H93.13 amoxicillin-clavulanate (AUGMENTIN) 875-125 MG per tablet     fluticasone (FLONASE) 50 MCG/ACT nasal spray   Signed, Elvina Sidle, MD 11/17/2014 8:02 PM

## 2014-12-08 ENCOUNTER — Ambulatory Visit
Admission: RE | Admit: 2014-12-08 | Discharge: 2014-12-08 | Disposition: A | Discharge status: Home / Self Care | Payer: Managed Care, Other (non HMO) | Source: Ambulatory Visit

## 2014-12-08 DIAGNOSIS — Z1231 Encounter for screening mammogram for malignant neoplasm of breast: Secondary | ICD-10-CM

## 2014-12-23 ENCOUNTER — Encounter: Payer: Self-pay | Admitting: Internal Medicine

## 2014-12-23 ENCOUNTER — Ambulatory Visit (INDEPENDENT_AMBULATORY_CARE_PROVIDER_SITE_OTHER): Payer: Managed Care, Other (non HMO) | Admitting: Internal Medicine

## 2014-12-23 VITALS — BP 126/80 | HR 71 | Temp 98.1°F | Ht 68.0 in | Wt 227.0 lb

## 2014-12-23 DIAGNOSIS — J309 Allergic rhinitis, unspecified: Secondary | ICD-10-CM

## 2014-12-23 DIAGNOSIS — K219 Gastro-esophageal reflux disease without esophagitis: Secondary | ICD-10-CM

## 2014-12-23 DIAGNOSIS — H9203 Otalgia, bilateral: Secondary | ICD-10-CM | POA: Diagnosis not present

## 2014-12-23 DIAGNOSIS — H9313 Tinnitus, bilateral: Secondary | ICD-10-CM

## 2014-12-23 DIAGNOSIS — H9209 Otalgia, unspecified ear: Secondary | ICD-10-CM | POA: Insufficient documentation

## 2014-12-23 MED ORDER — CETIRIZINE HCL 10 MG PO TABS: 10.0000 mg | ORAL_TABLET | Freq: Every day | ORAL | Status: DC

## 2014-12-23 MED ORDER — FLUTICASONE PROPIONATE 50 MCG/ACT NA SUSP: 2.0000 | Freq: Two times a day (BID) | NASAL | Status: DC

## 2014-12-23 NOTE — Patient Instructions (Signed)
Please take all new medication as prescribed - the zyrtec, and flonase  You can also take Mucinex D (or it's generic off brand) for congestion, and tylenol as needed for pain.  We could consider referrals to Allergist or ENT if needed  Please continue all other medications as before, and refills have been done if requested.  Please have the pharmacy call with any other refills you may need.  Please keep your appointments with your specialists as you may have planned

## 2014-12-23 NOTE — Progress Notes (Signed)
Pre visit review using our clinic review tool, if applicable. No additional management support is needed unless otherwise documented below in the visit note. 

## 2014-12-26 ENCOUNTER — Encounter: Payer: Self-pay | Admitting: Internal Medicine

## 2014-12-26 DIAGNOSIS — J309 Allergic rhinitis, unspecified: Secondary | ICD-10-CM | POA: Insufficient documentation

## 2014-12-26 HISTORY — DX: Allergic rhinitis, unspecified: J30.9

## 2014-12-26 NOTE — Assessment & Plan Note (Signed)
Ok for OTC prilosec prn,  to f/u any worsening symptoms or concerns

## 2014-12-26 NOTE — Progress Notes (Signed)
Subjective:    Patient ID: [name], female    DOB: Jul 10, 1965, 49 y.o.   MRN: [id number]  HPI  Here today with mild bilat ear popping and fullness, slight hearing loss, slightly better with valsalva maneuver, but just doesn't stay.  Not better or worse with anything else. Pt denies chest pain, increased sob or doe, wheezing, orthopnea, PND, increased LE swelling, palpitations, dizziness or syncope.  Pt denies new neurological symptoms such as new headache, or facial or extremity weakness or numbness  Does have several wks ongoing nasal allergy symptoms with clearish congestion, itch and sneezing, without fever, pain, ST, cough, swelling or wheezing.  Has not been taking the flonase.  Has had mild worsening reflux symtpoms, but no abd pain, dysphagia, n/v, bowel change or blood. Past Medical History  Diagnosis Date  . Thyroid disease    Past Surgical History  Procedure Laterality Date  . Appendectomy    . Cholecystectomy      reports that she has never smoked. She has never used smokeless tobacco. She reports that she drinks alcohol. She reports that she does not use illicit drugs. family history includes Cancer in her paternal grandmother; Diabetes in her mother; Healthy in her father; Heart disease in her maternal grandfather and paternal grandfather; Multiple sclerosis in her maternal grandmother. Allergies  Allergen Reactions  . Vicodin [Hydrocodone-Acetaminophen]    Current Outpatient Prescriptions on File Prior to Visit  Medication Sig Dispense Refill  . Bioflavonoid Products (C-COMPLEX PO) Take 1,000 mg by mouth.    . Cholecalciferol (VITAMIN D-3 PO) Take 2,000 Units by mouth.    . co-enzyme Q-10 30 MG capsule Take 200 mg by mouth 3 (three) times daily.    [name] levothyroxine (SYNTHROID, LEVOTHROID) 150 MCG tablet Take 150 mcg by mouth daily.    [name] liothyronine (CYTOMEL) 5 MCG tablet Take 5 mcg by mouth daily.    . Omega-3 Fatty Acids (FISH OIL) 1360 MG CAPS Take 1,600 mg by  mouth.    [name] amoxicillin-clavulanate (AUGMENTIN) 875-125 MG per tablet Take 1 tablet by mouth 2 (two) times daily. (Patient not taking: Reported on 12/23/2014) 20 tablet 0   No current facility-administered medications on file prior to visit.   Review of Systems  Constitutional: Negative for unusual diaphoresis or night sweats HENT: Negative for ringing in ear or discharge Eyes: Negative for double vision or worsening visual disturbance.  Respiratory: Negative for choking and stridor.   Gastrointestinal: Negative for vomiting or other signifcant bowel change Genitourinary: Negative for hematuria or change in urine volume.  Musculoskeletal: Negative for other MSK pain or swelling Skin: Negative for color change and worsening wound.  Neurological: Negative for tremors and numbness other than noted  Psychiatric/Behavioral: Negative for decreased concentration or agitation other than above       Objective:   Physical Exam BP 126/80 mmHg  Pulse 71  Temp(Src) 98.1 F (36.7 C) (Oral)  Ht  (1.727 m)  Wt 227 lb (102.967 kg)  BMI 34.52 kg/m2  SpO2 95%  LMP 10/03/2008 VS noted,  Constitutional: Pt appears in no significant distress HENT: Head: NCAT.  Right Ear: External ear normal.  Left Ear: External ear normal.  Bilat tm's with mild erythema.  Max sinus areas none tender.  Pharynx with mild erythema, no exudate Eyes: . Pupils are equal, round, and reactive to light. Conjunctivae and EOM are normal Neck: Normal range of motion. Neck supple.  Cardiovascular: Normal rate and regular rhythm.   Pulmonary/Chest: Effort  normal and breath sounds without rales or wheezing.  Neurological: Pt is alert. Not confused , motor grossly intact Skin: Skin is warm. No rash, no LE edema Psychiatric: Pt behavior is normal. No agitation.     Assessment & Plan:   

## 2014-12-26 NOTE — Assessment & Plan Note (Signed)
C/w bilat eustachian dysfxn, for zyrtec/flonase/mucinex D prn,  to f/u any worsening symptoms or concerns

## 2014-12-26 NOTE — Assessment & Plan Note (Signed)
D/w pt, for zyrtec/flonase asd,  to f/u any worsening symptoms or concerns

## 2015-01-07 ENCOUNTER — Encounter: Payer: Self-pay | Admitting: Internal Medicine

## 2015-01-07 DIAGNOSIS — J309 Allergic rhinitis, unspecified: Secondary | ICD-10-CM

## 2015-02-09 ENCOUNTER — Ambulatory Visit (INDEPENDENT_AMBULATORY_CARE_PROVIDER_SITE_OTHER): Payer: Managed Care, Other (non HMO)

## 2015-02-09 ENCOUNTER — Ambulatory Visit (INDEPENDENT_AMBULATORY_CARE_PROVIDER_SITE_OTHER): Payer: Managed Care, Other (non HMO) | Admitting: Internal Medicine

## 2015-02-09 VITALS — BP 118/84 | HR 78 | Temp 98.0°F | Resp 14 | Ht 68.0 in | Wt 224.0 lb

## 2015-02-09 DIAGNOSIS — M79671 Pain in right foot: Secondary | ICD-10-CM | POA: Diagnosis not present

## 2015-02-09 DIAGNOSIS — M79662 Pain in left lower leg: Secondary | ICD-10-CM

## 2015-02-09 DIAGNOSIS — M7989 Other specified soft tissue disorders: Secondary | ICD-10-CM

## 2015-02-09 NOTE — Progress Notes (Addendum)
   Subjective:    Patient ID: Olivia Mendez, female    DOB: December 02, 1965, 49 y.o.   MRN: [id number] This chart was scribed for Ellamae Sia, MD by Jolene Provost, Medical Scribe. This patient was seen in Room 9 and the patient's care was started a 8:31 PM.  Chief Complaint  Patient presents with  . Leg Pain    Bilateral, couple weeks   . DOesnt want flu shot    HPI HPI Comments: Olivia Mendez is a 49 y.o. female who presents to [facility] complaining of bilateral ankle swelling for the last two weeks. She also has significant pain in her lateral right foot that started last night. She is limping due to pain. Two months ago both ankles swelled up but that resolved until the most recent bout of swelling. She states her bilateral feet have been colder than usual. Pt denies cough, fever, or SOB. She sits all day for work during the week, and then stands all day on the weekend for work.   She also states she has not been able to wear elastic socks for the last couple of years due to an agitating.    Review of Systems  Constitutional: Negative for fever and chills.  Musculoskeletal: Positive for joint swelling and gait problem.  Skin: Negative for wound.  Neurological: Positive for weakness (Secondary to pain).       Objective:   Physical Exam  Constitutional: She is oriented to person, place, and time. She appears well-developed and well-nourished. No distress.  HENT:  Head: Normocephalic and atraumatic.  Eyes: Pupils are equal, round, and reactive to light.  Neck: Neck supple.  Cardiovascular: Normal rate.   Pulmonary/Chest: Effort normal. No respiratory distress.  Musculoskeletal: Normal range of motion.  TTP at the base of the fifth metatarsal on the right. Worse with inversion. No ecchymoses. Minimal swelling there.  Left calf swollen 1cm, as compared with right. TTP mid gastroc and extending into the popliteal fossa without mass or defect or cord. Gait slightly painful Knee exam  stable to stress ors  Neurological: She is alert and oriented to person, place, and time. She has normal reflexes. No cranial nerve deficit.  Skin: Skin is warm and dry. She is not diaphoretic.  Psychiatric: She has a normal mood and affect. Her behavior is normal.  Nursing note and vitals reviewed.   Filed Vitals:   02/09/15 1915  BP: 118/84  Pulse: 78  Temp: 98 F (36.7 C)  TempSrc: Oral  Resp: 14  Height:  (1.727 m)  Weight: 224 lb (101.606 kg)  SpO2: 99%   [facility] reading (PRIMARY) by  Dr. RPD= no fx.      Assessment & Plan:  Right foot pain - Plan: DG Foot Complete Right  Pain and swelling of lower leg, left - Plan: US Venous Img Lower Unilateral Left  Ibuprofen started We'll scan to rule out DVT versus Baker's cyst Right foot has likely peroneal tendinitis following work in garden with boot--ice tid   I have completed the patient encounter in its entirety as documented by the scribe, with editing by me where necessary. Evander Macaraeg P. Merla Riches, M.D.  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

## 2015-02-10 ENCOUNTER — Other Ambulatory Visit: Payer: Self-pay | Admitting: Internal Medicine

## 2015-02-10 ENCOUNTER — Ambulatory Visit
Admission: RE | Admit: 2015-02-10 | Discharge: 2015-02-10 | Disposition: A | Discharge status: Home / Self Care | Payer: Managed Care, Other (non HMO) | Source: Ambulatory Visit | Attending: Internal Medicine | Admitting: Internal Medicine

## 2015-02-10 ENCOUNTER — Telehealth: Payer: Self-pay | Admitting: *Deleted

## 2015-02-10 DIAGNOSIS — M79662 Pain in left lower leg: Secondary | ICD-10-CM

## 2015-02-10 DIAGNOSIS — M7989 Other specified soft tissue disorders: Principal | ICD-10-CM

## 2015-02-10 MED ORDER — MELOXICAM 15 MG PO TABS: 15.0000 mg | ORAL_TABLET | Freq: Every day | ORAL | Status: DC

## 2015-02-10 NOTE — Telephone Encounter (Signed)
Pt was called and advised of her scheduled Venous Doppler at [facility] (10:25 am) on 02/10/15.  Pt was given directions and she understood.

## 2015-02-10 NOTE — Addendum Note (Signed)
Addended by: Tonye Pearson on: 02/10/2015 02:05 PM   Modules accepted: Orders

## 2015-05-05 ENCOUNTER — Encounter: Payer: Self-pay | Admitting: Internal Medicine

## 2015-05-05 ENCOUNTER — Ambulatory Visit (INDEPENDENT_AMBULATORY_CARE_PROVIDER_SITE_OTHER): Payer: Managed Care, Other (non HMO) | Admitting: Internal Medicine

## 2015-05-05 ENCOUNTER — Other Ambulatory Visit (INDEPENDENT_AMBULATORY_CARE_PROVIDER_SITE_OTHER): Payer: Managed Care, Other (non HMO)

## 2015-05-05 VITALS — BP 110/82 | HR 88 | Ht 68.0 in | Wt 222.8 lb

## 2015-05-05 DIAGNOSIS — J309 Allergic rhinitis, unspecified: Secondary | ICD-10-CM

## 2015-05-05 DIAGNOSIS — Z91018 Allergy to other foods: Secondary | ICD-10-CM

## 2015-05-05 DIAGNOSIS — J069 Acute upper respiratory infection, unspecified: Secondary | ICD-10-CM | POA: Diagnosis not present

## 2015-05-05 DIAGNOSIS — J019 Acute sinusitis, unspecified: Secondary | ICD-10-CM | POA: Diagnosis not present

## 2015-05-05 DIAGNOSIS — H9313 Tinnitus, bilateral: Secondary | ICD-10-CM

## 2015-05-05 LAB — SEDIMENTATION RATE: Sed Rate: 37 mm/hr — ABNORMAL HIGH (ref 0–22)

## 2015-05-05 LAB — C-REACTIVE PROTEIN: CRP: 6.9 mg/dL (ref 0.5–20.0)

## 2015-05-05 MED ORDER — AMOXICILLIN-POT CLAVULANATE 875-125 MG PO TABS: 1.0000 | ORAL_TABLET | Freq: Two times a day (BID) | ORAL | Status: DC

## 2015-05-05 NOTE — Assessment & Plan Note (Signed)
She does not give any impressive atopic history. Plan-labs for allergy profile, food IgE profile and also inflammatory markers-sedimentation rate, ANA, CRP

## 2015-05-05 NOTE — Assessment & Plan Note (Signed)
Nonspecific tinnitus may reflect age without history of loud noise exposure. She does use occasional aspirin which we discussed. Plan-ENT would be best guide for workup strategy with consideration of evaluation for vascular and acoustic neuroma causes to be decided based on their professional experience. She does not have much of atopic history to suggest a connection. Interesting that this began around the time that she experienced edema in hands and legs, with knee pain. We can look for some basic inflammatory markers. She does have a cold now which may affect results.

## 2015-05-05 NOTE — Patient Instructions (Signed)
Script sent for augmentin antibiotic- take after meals  It may help tolerate the antibiotic to take with it an otc probiotic such as Florastor, Align, or Activia yogurt  Order- lab- Allergy profile, Food IgE profile, Sed rate, ANA, C-reactive protein           Dx Food allergy, acute sinusitis, tinnitus   Please call as needed

## 2015-05-05 NOTE — Assessment & Plan Note (Signed)
In recent days she began what sounds like a viral upper respiratory infection but eventually drifting into an acute bilateral maxillary sinusitis. Plan-Augmentin

## 2015-05-05 NOTE — Progress Notes (Signed)
05/05/2015-49 year old female never smoker referred courtesy of Dr. Jonny Ruiz with question of allergy, complaining of tinnitus and a recent head cold. Referred by Dr Jonny Ruiz for ringing in ears and allergies - started in may 2016.  Pt c/o ear pain, sinus pressure, sore throat, cough, green thick mucus and blood at times x 3 days. Denies fever. Onset of bilateral continuous tinnitus in June, 2016. It was noticed a few days after she had worked outdoors standing up in heat then noticed swelling in hands and feet and pain in knee, which took several days to go away. She was offered diuretic she chose not to take. ENT evaluation by Dr. Suszanne Conners with hearing she says tested normal for age. She was given Flonase. She has never considered herself an allergic person. She avoids wheat which causes "issues" but tested negative for celiac disease. Feels better off of wheat. Hearing and balance okay occasional colds. She admits "more than occasional" use of aspirin with no loud noise exposure. No history of ENT surgery.  Prior to Admission medications   Medication Sig Start Date End Date Taking? Authorizing Provider  levothyroxine (SYNTHROID, LEVOTHROID) 150 MCG tablet Take 150 mcg by mouth daily.   Yes Historical Provider, MD  liothyronine (CYTOMEL) 5 MCG tablet Take 5 mcg by mouth daily.   Yes Historical Provider, MD  amoxicillin-clavulanate (AUGMENTIN) 875-125 MG tablet Take 1 tablet by mouth 2 (two) times daily. 05/05/15   Waymon Budge, MD  Bioflavonoid Products (C-COMPLEX PO) Take 1,000 mg by mouth.    Historical Provider, MD  Cholecalciferol (VITAMIN D-3 PO) Take 2,000 Units by mouth.    Historical Provider, MD  co-enzyme Q-10 30 MG capsule Take 200 mg by mouth 3 (three) times daily.    Historical Provider, MD  fluticasone (FLONASE) 50 MCG/ACT nasal spray Place 2 sprays into both nostrils 2 (two) times daily. Patient not taking: Reported on 05/05/2015 12/23/14   Corwin Levins, MD  Omega-3 Fatty Acids (FISH OIL)  1360 MG CAPS Take 1,600 mg by mouth.    Historical Provider, MD   Past Medical History  Diagnosis Date  . Hypothyroid   . Allergic rhinitis 12/26/2014  . Migraines   . Endometriosis 1990's   Past Surgical History  Procedure Laterality Date  . Appendectomy    . Cholecystectomy    . Other surgical history      surgery for Endometriosis   Family History  Problem Relation Age of Onset  . Diabetes Mother   . Multiple sclerosis Maternal Grandmother   . Heart disease Maternal Grandfather   . Cancer Paternal Grandmother     melanoma  . Heart disease Paternal Grandfather   . Healthy Father    Social History   Social History  . Marital Status: Married    Spouse Name: N/A  . Number of Children: 0  . Years of Education: 16   Occupational History  . Data analyst    Social History Main Topics  . Smoking status: Never Smoker   . Smokeless tobacco: Never Used  . Alcohol Use: 0.0 oz/week    0 Standard drinks or equivalent per week     Comment: socially rare  . Drug Use: No  . Sexual Activity: Yes    Birth Control/ Protection: Other-see comments     Comment: husband vasectomy   Other Topics Concern  . Not on file   Social History Narrative   Fun: Garden, read   Denies religious beliefs effecting health care.    ROS-see  HPI   Negative unless "+" Constitutional:    weight loss, night sweats, fevers, chills, fatigue, lassitude. HEENT:    headaches, + difficulty swallowing, + tooth/dental problems, sore throat,       sneezing, itching, ear ache, nasal congestion, post nasal drip, snoring CV:    chest pain, orthopnea, PND, + swelling in lower extremities, anasarca,                                                     dizziness, + palpitations Resp:   + shortness of breath with exertion or at rest.                + productive cough,   non-productive cough, coughing up of blood.              change in color of mucus.  wheezing.   Skin:    rash or lesions. GI:  No-  + heartburn,  + indigestion, abdominal pain, nausea, vomiting, diarrhea,                 change in bowel habits, loss of appetite GU: dysuria, change in color of urine, no urgency or frequency.   flank pain. MS:   joint pain, + stiffness, decreased range of motion, back pain. Neuro-     nothing unusual Psych:  change in mood or affect.  depression or anxiety.   memory loss.  OBJ- Physical Exam General- Alert, Oriented, Affect-appropriate, Distress- none acute Skin- rash-none, lesions- none, excoriation- none Lymphadenopathy- none Head- atraumatic            Eyes- Gross vision intact, PERRLA, conjunctivae and secretions clear            Ears- Hearing, canals-normal, TMs normal            Nose- Clear, no-Septal dev, mucus, polyps, erosion, perforation             Throat- Mallampati III , mucosa clear , drainage- none, tonsils- atrophic, + hoarse Neck- flexible , trachea midline, no stridor , thyroid nl, carotid no bruit Chest - symmetrical excursion , unlabored           Heart/CV- RRR , no murmur , no gallop  , no rub, nl s1 s2                           - JVD- none , edema- none, stasis changes- none, varices- none           Lung- clear to P&A, wheeze- none, cough- none , dullness-none, rub- none           Chest wall-  Abd-  Br/ Gen/ Rectal- Not done, not indicated Extrem- cyanosis- none, clubbing, none, atrophy- none, strength- nl Neuro- grossly intact to observation

## 2015-05-06 LAB — ALLERGY FULL PROFILE
Allergen, D pternoyssinus,d7: <0.1 kU/L
Allergen,Goose feathers, e70: <0.1 kU/L
Aspergillus fumigatus, m3: <0.1 kU/L
Bermuda Grass: <0.1 kU/L
Common Ragweed: <0.1 kU/L
D. farinae: <0.1 kU/L
Dog Dander: <0.1 kU/L
Fescue: <0.1 kU/L
G009 Red Top: <0.1 kU/L
Goldenrod: <0.1 kU/L
House Dust Hollister: <0.1 kU/L
Oak: <0.1 kU/L
Sycamore Tree: <0.1 kU/L
Timothy Grass: <0.1 kU/L

## 2015-05-06 LAB — ALLERGEN FOOD PROFILE SPECIFIC IGE
Chicken IgE: <0.1 kU/L
Corn: <0.1 kU/L
Egg White IgE: <0.1 kU/L
Fish Cod: <0.1 kU/L
IgE (Immunoglobulin E), Serum: 3 kU/L (ref <115)
Milk IgE: <0.1 kU/L
Orange: <0.1 kU/L
Soybean IgE: <0.1 kU/L
Tuna IgE: <0.1 kU/L
Wheat IgE: <0.1 kU/L

## 2015-05-06 LAB — ANTI-NUCLEAR AB-TITER (ANA TITER): ANA Titer 1: 1:160 {titer} — ABNORMAL HIGH

## 2015-05-06 LAB — ANA: ANA: POSITIVE — AB

## 2015-05-12 ENCOUNTER — Telehealth: Payer: Self-pay | Admitting: Internal Medicine

## 2015-05-12 NOTE — Telephone Encounter (Signed)
Notes Recorded by Waymon Budgelinton D Young, MD on 05/11/2015 at 3:28 PM Labs- Allergy antibody levels are very low. This means environmental allergies are not likely cause of her symptoms.  However- her ANA test came back somewhat elevated. This can sometimes be associated with lupus. I will notify Dr Jonny RuizJohn, and ask [name] to follow up with him ----  ATC received message VM not set up [facility]

## 2015-05-12 NOTE — Telephone Encounter (Signed)
I spoke with patient about results and she verbalized understanding and had no questions 

## 2015-09-02 ENCOUNTER — Ambulatory Visit (INDEPENDENT_AMBULATORY_CARE_PROVIDER_SITE_OTHER): Payer: Managed Care, Other (non HMO) | Admitting: Internal Medicine

## 2015-09-02 ENCOUNTER — Encounter: Payer: Self-pay | Admitting: Internal Medicine

## 2015-09-02 VITALS — BP 112/78 | HR 63 | Ht 68.0 in | Wt 219.2 lb

## 2015-09-02 DIAGNOSIS — H9313 Tinnitus, bilateral: Secondary | ICD-10-CM | POA: Diagnosis not present

## 2015-09-02 DIAGNOSIS — H9319 Tinnitus, unspecified ear: Secondary | ICD-10-CM | POA: Diagnosis not present

## 2015-09-02 DIAGNOSIS — J309 Allergic rhinitis, unspecified: Secondary | ICD-10-CM

## 2015-09-02 NOTE — Patient Instructions (Addendum)
[facility]    Dx tinnitus  Suggest you discuss with Dr Jonny RuizJohn whether GI evaluation would be appropriate for you, since you think wheat affects your bowel. Also discuss with him what to do with the lupus test results.

## 2015-09-02 NOTE — Progress Notes (Signed)
05/05/2015-50 year old female never smoker referred courtesy of Dr. Jonny Ruiz with question of allergy, complaining of tinnitus and a recent head cold. Referred by Dr Jonny Ruiz for ringing in ears and allergies - started in may 2016.  Pt c/o ear pain, sinus pressure, sore throat, cough, green thick mucus and blood at times x 3 days. Denies fever. Onset of bilateral continuous tinnitus in June, 2016. It was noticed a few days after she had worked outdoors standing up in heat then noticed swelling in hands and feet and pain in knee, which took several days to go away. She was offered diuretic she chose not to take. ENT evaluation by Dr. Suszanne Conners with hearing she says tested normal for age. She was given Flonase. She has never considered herself an allergic person. She avoids wheat which causes "issues" but tested negative for celiac disease. Feels better off of wheat. Hearing and balance okay occasional colds. She admits "more than occasional" use of aspirin with no loud noise exposure. No history of ENT surgery.  09/02/2015-51 year old female never smoker followed for  Rhinosinusitis; allergy, tinnitus,  Labs 04/08/2015- ANA Pos/ 1:160; Sed rate 37; CRP nl; Food and Environmental Allergy Profiles neg w Total IgE 3;  Follows for: Tinnitus. Pt states that she was told her labs may possibly be indicative of lupus but has not gotten confirmation. Pt c/o continued tinnitus and leg swelling. Pt is also concerned about wheat allergy/intolerance.  She associates eating wheat products with a pressure sensation in her rectum intermittently. I suggested she see GI about this. She had tested negative for celiac disease and for wheat IgE as discussed. Still bothered by tinnitus. She got over the upper respiratory infection syndrome present at last visit.  ROS-see HPI   Negative unless "+" Constitutional:    weight loss, night sweats, fevers, chills, fatigue, lassitude. HEENT:    headaches, + difficulty swallowing, +  tooth/dental problems, sore throat,       sneezing, itching, ear ache, nasal congestion, post nasal drip, snoring CV:    chest pain, orthopnea, PND, + swelling in lower extremities, anasarca,                                                     dizziness,  palpitations Resp:    shortness of breath with exertion or at rest.                productive cough,   non-productive cough, coughing up of blood.              change in color of mucus.  wheezing.   Skin:    rash or lesions. GI:  No-   heartburn, + indigestion, abdominal pain, nausea, vomiting, diarrhea,                 change in bowel habits, loss of appetite GU: dysuria, change in color of urine, no urgency or frequency.   flank pain. MS:   joint pain, + stiffness, decreased range of motion, back pain. Neuro-     nothing unusual Psych:  change in mood or affect.  depression or anxiety.   memory loss.  OBJ- Physical Exam General- Alert, Oriented, Affect-appropriate, Distress- none acute, + looks well Skin- rash-none, lesions- none, excoriation- none Lymphadenopathy- none Head- atraumatic            Eyes-  Gross vision intact, PERRLA, conjunctivae and secretions clear            Ears- Hearing, canals-normal, TMs normal            Nose- Clear, no-Septal dev, mucus, polyps, erosion, perforation             Throat- Mallampati III , mucosa clear , drainage- none, tonsils- atrophic, + hoarse Neck- flexible , trachea midline, no stridor , thyroid nl, carotid no bruit Chest - symmetrical excursion , unlabored           Heart/CV- RRR , no murmur , no gallop  , no rub, nl s1 s2                           - JVD- none , edema- none, stasis changes- none, varices- none           Lung- clear to P&A, wheeze- none, cough- none , dullness-none, rub- none           Chest wall-  Abd-  Br/ Gen/ Rectal- Not done, not indicated Extrem- cyanosis- none, clubbing, none, atrophy- none, strength- nl Neuro- grossly intact to observation

## 2015-09-03 NOTE — Assessment & Plan Note (Signed)
Diagnosis not supported by in vitro testing including very low total IgE. Her primary concern now is persistent tinnitus

## 2015-09-03 NOTE — Assessment & Plan Note (Signed)
I suggested she see ENT for further evaluation of this. She is likely beginning to lose some hearing.

## 2015-09-16 ENCOUNTER — Encounter: Payer: Self-pay | Admitting: Internal Medicine

## 2015-11-16 ENCOUNTER — Encounter: Payer: Managed Care, Other (non HMO) | Admitting: Women's Health

## 2015-12-02 ENCOUNTER — Encounter: Payer: Self-pay | Admitting: Women's Health

## 2015-12-02 ENCOUNTER — Ambulatory Visit (INDEPENDENT_AMBULATORY_CARE_PROVIDER_SITE_OTHER): Payer: Managed Care, Other (non HMO) | Admitting: Women's Health

## 2015-12-02 VITALS — BP 126/80 | Ht 68.0 in | Wt 211.0 lb

## 2015-12-02 DIAGNOSIS — Z01419 Encounter for gynecological examination (general) (routine) without abnormal findings: Secondary | ICD-10-CM

## 2015-12-02 NOTE — Progress Notes (Signed)
Olivia Mendez 02/14/1966 [id number]    History:    Presents for annual exam.  Postmenopausal, no HRT, no bleeding.  History of infertility, endometriosis.  2003 endometrioma.  Normal Pap and mammogram history.  Colonoscopy 2010 showed mild diverticulosis.  Hypothyroidism managed by endocrinologist.  History of GERD, managed by PCP.  Reports several recent tick bites, one on left thigh developed rash and was treated with antibiotics.  Recent skin biopsy by dermatologist on left shoulder blade, benign.  Past medical history, past surgical history, family history and social history were all reviewed and documented in the EPIC chart.  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 Mother diabetes.  ROS:  A ROS was performed and pertinent positives and negatives are included.  Exam:  Filed Vitals:   12/02/15 1408  BP: 126/80    General appearance:  Normal Thyroid:  Symmetrical, normal in size, without palpable masses or nodularity. Respiratory  Auscultation:  Clear without wheezing or rhonchi Cardiovascular  Auscultation:  Regular rate, without rubs, murmurs or gallops  Edema/varicosities:  Not grossly evident Abdominal  Soft,nontender, without masses, guarding or rebound.  Liver/spleen:  No organomegaly noted  Hernia:  None appreciated  Skin  Inspection:  Grossly normal.  Erythema and scar from skin biopsy left shoulder blade.  Small red raised skin lesions left thigh and right shin due to tick bites.   Breasts: Examined lying and sitting.     Right: Without masses, retractions, discharge or axillary adenopathy.     Left: Without masses, retractions, discharge or axillary adenopathy. Gentitourinary   Inguinal/mons:  Normal without inguinal adenopathy  External genitalia:  Normal  BUS/Urethra/Skene's glands:  Normal  Vagina:  Normal  Cervix:  Normal  Uterus:  Normal in size, shape and contour.  Midline and mobile  Adnexa/parametria:     Rt: Without masses or  tenderness.   Lt: Without masses or tenderness.  Anus and perineum: Normal  Digital rectal exam: Normal sphincter tone without palpated masses or tenderness  Assessment/Plan:  50 y.o. MWF G0 for annual exam with no complaints.  Postmenopausal, no HRT, no bleeding Obesity Tick bites left thigh, right shin Hypothyroidism, endocrinologist managing meds and labs GERD, primary care physician manages  Plan: SBE's, annual mammogram, screening guidelines reviewed.  Encouraged healthy calcium-rich diet, vitamin D 2000units, decrease carbs in diet, increase regular exercise, and sunburn protection measures.  Discussed tick bite prevention and proper tick removal if needed.  UA.  Negative Pap 2016, screening guidelines reviewed.         Harrington ChallengerYOUNG,NANCY J [facility] Care[facility]NP, 2:36 PM 12/02/2015

## 2015-12-02 NOTE — Patient Instructions (Signed)

## 2015-12-03 LAB — URINALYSIS W MICROSCOPIC + REFLEX CULTURE
BACTERIA UA: NONE SEEN [HPF]
Bilirubin Urine: NEGATIVE
Casts: NONE SEEN [LPF]
Crystals: NONE SEEN [HPF]
GLUCOSE, UA: NEGATIVE
HGB URINE DIPSTICK: NEGATIVE
LEUKOCYTES UA: NEGATIVE
Nitrite: NEGATIVE
Protein, ur: NEGATIVE
RBC / HPF: NONE SEEN RBC/HPF (ref <=2)
SQUAMOUS EPITHELIAL / LPF: NONE SEEN [HPF] (ref <=5)
Specific Gravity, Urine: 1.018 (ref 1.001–1.035)
WBC, UA: NONE SEEN WBC/HPF (ref <=5)
YEAST: NONE SEEN [HPF]
pH: 5.5 (ref 5.0–8.0)

## 2016-01-14 ENCOUNTER — Ambulatory Visit (INDEPENDENT_AMBULATORY_CARE_PROVIDER_SITE_OTHER): Payer: Managed Care, Other (non HMO) | Admitting: Physician Assistant

## 2016-01-14 VITALS — BP 112/86 | HR 86 | Temp 98.1°F | Resp 16 | Ht 68.5 in | Wt 212.0 lb

## 2016-01-14 DIAGNOSIS — N3001 Acute cystitis with hematuria: Secondary | ICD-10-CM | POA: Diagnosis not present

## 2016-01-14 DIAGNOSIS — R350 Frequency of micturition: Secondary | ICD-10-CM

## 2016-01-14 DIAGNOSIS — R300 Dysuria: Secondary | ICD-10-CM | POA: Diagnosis not present

## 2016-01-14 LAB — POCT URINALYSIS DIP (MANUAL ENTRY)
Bilirubin, UA: NEGATIVE
GLUCOSE UA: NEGATIVE
Ketones, POC UA: NEGATIVE
Nitrite, UA: NEGATIVE
PH UA: 6
Protein Ur, POC: NEGATIVE
Spec Grav, UA: <=1.005
UROBILINOGEN UA: 0.2

## 2016-01-14 LAB — POC MICROSCOPIC URINALYSIS (UMFC): Mucus: ABSENT

## 2016-01-14 MED ORDER — NITROFURANTOIN MONOHYD MACRO 100 MG PO CAPS
100.0000 mg | ORAL_CAPSULE | Freq: Two times a day (BID) | ORAL | 0 refills | Status: DC
Start: 2016-01-14 — End: 2016-01-22

## 2016-01-14 NOTE — Progress Notes (Signed)
Uu 

## 2016-01-14 NOTE — Progress Notes (Signed)
Subjective:    Patient ID: Olivia Mendez, female    DOB: 18-Aug-1965, 50 y.o.   MRN: [id number]  HPI  Patient presents with increased urinary frequency and urgency, dysuria and pressure since last night. Also c/o some suprapubic abdominal pain that is mild and body aches. Denies fever, chills, back pain, flank pain, vaginal discharge, or hematuria. No recent change in sexual partner. Drinking lots of water. Takes a B vitamin so urine is always bright yellow in color.  She has not  Had a UTI in many years.   Review of Systems  Gastrointestinal: Negative for constipation, diarrhea, nausea and vomiting.  Genitourinary: Positive for dysuria and frequency. Negative for menstrual problem and vaginal discharge.  All other systems reviewed and are negative.    Current Outpatient Prescriptions:  .  b complex vitamins tablet, Take 1 tablet by mouth daily., Disp: , Rfl:  .  Bioflavonoid Products (C-COMPLEX PO), Take 1,000 mg by mouth., Disp: , Rfl:  .  Cholecalciferol (VITAMIN D-3 PO), Take 2,000 Units by mouth., Disp: , Rfl:  .  co-enzyme Q-10 30 MG capsule, Take 200 mg by mouth 3 (three) times daily., Disp: , Rfl:  .  levothyroxine (SYNTHROID, LEVOTHROID) 150 MCG tablet, Take 150 mcg by mouth daily., Disp: , Rfl:  .  liothyronine (CYTOMEL) 5 MCG tablet, Take 5 mcg by mouth daily., Disp: , Rfl:  .  Omega-3 Fatty Acids (FISH OIL) 1360 MG CAPS, Take 1,600 mg by mouth., Disp: , Rfl:    Allergies  Allergen Reactions  . Vicodin [Hydrocodone-Acetaminophen]       Objective:   Physical Exam  Constitutional: She is oriented to person, place, and time. She appears well-developed and well-nourished. She is cooperative. No distress.  Blood pressure 112/86, pulse 86, temperature 98.1 F (36.7 C), resp. rate 16, height 5' 8.5" (1.74 m), weight 212 lb (96.2 kg), last menstrual period 10/03/2008, SpO2 99 %.  HENT:  Head: Normocephalic and atraumatic.  Eyes: Conjunctivae and lids are normal. No scleral  icterus.  Neck: Trachea normal and normal range of motion. Neck supple.  Cardiovascular: Normal rate, regular rhythm and normal heart sounds.   Pulses:      Radial pulses are 2+ on the right side, and 2+ on the left side.  Pulmonary/Chest: Effort normal and breath sounds normal.  Abdominal: Soft. Bowel sounds are normal. There is tenderness (mild) in the suprapubic area. There is no CVA tenderness.  Musculoskeletal: Normal range of motion.  Lymphadenopathy:    She has no cervical adenopathy.  Neurological: She is alert and oriented to person, place, and time.  Skin: Skin is warm, dry and intact. She is not diaphoretic. No pallor.  Psychiatric: She has a normal mood and affect. Her speech is normal and behavior is normal.    Results for orders placed or performed in visit on 01/14/16  POCT Microscopic Urinalysis ([facility])  Result Value Ref Range   WBC,UR,HPF,POC None None WBC/hpf   RBC,UR,HPF,POC None None RBC/hpf   Bacteria None None, Too numerous to count   Mucus Absent Absent   Epithelial Cells, UR Per Microscopy Few (A) None, Too numerous to count cells/hpf  POCT urinalysis dipstick  Result Value Ref Range   Color, UA yellow yellow   Clarity, UA clear clear   Glucose, UA negative negative   Bilirubin, UA negative negative   Ketones, POC UA negative negative   Spec Grav, UA <=1.005    Blood, UA moderate (A) negative   pH, UA  6.0    Protein Ur, POC negative negative   Urobilinogen, UA 0.2    Nitrite, UA Negative Negative   Leukocytes, UA Trace (A) Negative      Assessment & Plan:  1. Urinary frequency 2. Dysuria - POCT Microscopic Urinalysis ([facility]) - POCT urinalysis dipstick - Urine culture. - urine is really dilute and her symptoms just started we will start abx while waiting on culture - we had a conversation that if her urine cx was neg and she symptoms continued, although it is still early on. Will refer back to culture if patient is not improving.   3. Acute  cystitis with hematuria - nitrofurantoin, macrocrystal-monohydrate, (MACROBID) 100 MG capsule; Take 1 capsule (100 mg total) by mouth 2 (two) times daily.  Dispense: 10 capsule; Refill: 0 - Discussed with patient that antibiotic is very well tolerated and that she will take BID x5 days. Can try azo OTC or cranberry juice for symptomatic relief. Continue to make sure she is getting adequate hydration.  - Follow up if no improvement in symptoms with antibiotic treatment   Benny LennertSarah Armya Westerhoff PA-C  [facility] 01/14/2016 3:39 PM

## 2016-01-14 NOTE — Patient Instructions (Addendum)
Drink fluids (water and cranberry juice are the best)    IF you received an x-ray today, you will receive an invoice from [facility]. Please contact [facility] at [phone] with questions or concerns regarding your invoice.   IF you received labwork today, you will receive an invoice from [facility]. Please contact [facility] at [phone] with questions or concerns regarding your invoice.   Our billing staff will not be able to assist you with questions regarding bills from these companies.  You will be contacted with the lab results as soon as they are available. The fastest way to get your results is to activate your My Chart account. Instructions are located on the last page of this paperwork. If you have not heard from us regarding the results in 2 weeks, please contact this office.

## 2016-01-16 LAB — URINE CULTURE

## 2016-01-22 ENCOUNTER — Ambulatory Visit (INDEPENDENT_AMBULATORY_CARE_PROVIDER_SITE_OTHER): Payer: Managed Care, Other (non HMO) | Admitting: Family Medicine

## 2016-01-22 VITALS — BP 122/80 | HR 65 | Temp 98.3°F | Resp 16 | Ht 65.0 in | Wt 211.0 lb

## 2016-01-22 DIAGNOSIS — N3080 Other cystitis without hematuria: Secondary | ICD-10-CM | POA: Diagnosis not present

## 2016-01-22 DIAGNOSIS — N3090 Cystitis, unspecified without hematuria: Secondary | ICD-10-CM

## 2016-01-22 DIAGNOSIS — R350 Frequency of micturition: Secondary | ICD-10-CM | POA: Diagnosis not present

## 2016-01-22 LAB — POCT URINALYSIS DIP (MANUAL ENTRY)
BILIRUBIN UA: NEGATIVE
BILIRUBIN UA: NEGATIVE
Blood, UA: NEGATIVE
Glucose, UA: NEGATIVE
LEUKOCYTES UA: NEGATIVE
Nitrite, UA: NEGATIVE
Protein Ur, POC: NEGATIVE
Spec Grav, UA: 1.01
Urobilinogen, UA: 0.2
pH, UA: 7

## 2016-01-22 LAB — POC MICROSCOPIC URINALYSIS (UMFC): Mucus: ABSENT

## 2016-01-22 MED ORDER — NITROFURANTOIN MONOHYD MACRO 100 MG PO CAPS: 100.0000 mg | ORAL_CAPSULE | Freq: Two times a day (BID) | ORAL | 0 refills | Status: DC

## 2016-01-22 NOTE — Progress Notes (Signed)
Subjective:  By signing my name below, I, Raven Small, attest that this documentation has been prepared under the direction and in the presence of Meredith StaggersJeffrey Keishon Chavarin, MD.  Electronically Signed: Andrew Auaven Small, ED Scribe. 01/22/2016. 8:53 AM.   Patient ID: Olivia Mendez, female    DOB: 11/28/1965, 50 y.o.   MRN: [id number]  HPI Chief Complaint  Patient presents with  . Urinary Tract Infection    has finished macrobid from last visit started back yesterday    HPI Comments: Olivia Mendez is a 50 y.o. female who presents to the [facility] complaining of . She was seen 8 days ago by Maralyn SagoSarah with urinary symptoms. Diagnosed with early UTI, treated with Macrobid 100 mg bid for 5 days. Urine culture ecoli sensitive to Macrobid.   Pt took 4 days of macrobid and stopped medication after symptoms started to improve. Symptoms returned yesterday with lower abdominal pressure, but no pain. She took last dose of macrobid yesterday. She denies fever, nausea and emesis.   Patient Active Problem List   Diagnosis Date Noted  . 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/05/2015  . Acute upper respiratory infection 05/05/2015  . Ear pain 12/23/2014  . Lower leg edema 11/13/2014  . Fatigue 11/13/2014  . Tick bite 11/13/2014  . HELICOBACTER PYLORI INFECTION 02/25/2009  . EROSIVE ESOPHAGITIS 02/25/2009  . ESOPHAGEAL STRICTURE 02/25/2009  . HIATAL HERNIA 02/25/2009  . DIVERTICULOSIS, COLON 02/25/2009  . GERD 12/01/2008  . Abdominal pain, left lower quadrant 12/01/2008  . ANXIETY 11/26/2008  . CHEST PAIN 11/26/2008  . HYPOTHYROIDISM 09/14/2008  . HYPERLIPIDEMIA 09/14/2008  . HEMATOCHEZIA 09/14/2008  . BACK PAIN 09/14/2008   Past Medical History:  Diagnosis Date  . Allergic rhinitis 12/26/2014  . Endometriosis 1990's  . Hypothyroid   . Migraines    Past Surgical History:  Procedure Laterality Date  . APPENDECTOMY    . CHOLECYSTECTOMY    . OTHER SURGICAL HISTORY     surgery for Endometriosis    Allergies  Allergen Reactions  . Vicodin [Hydrocodone-Acetaminophen]    Prior to Admission medications   Medication Sig Start Date End Date Taking? Authorizing Provider  b complex vitamins tablet Take 1 tablet by mouth daily.   Yes Historical Provider, MD  Bioflavonoid Products (C-COMPLEX PO) Take 1,000 mg by mouth.   Yes Historical Provider, MD  Cholecalciferol (VITAMIN D-3 PO) Take 2,000 Units by mouth.   Yes Historical Provider, MD  co-enzyme Q-10 30 MG capsule Take 200 mg by mouth 3 (three) times daily.   Yes Historical Provider, MD  levothyroxine (SYNTHROID, LEVOTHROID) 150 MCG tablet Take 150 mcg by mouth daily.   Yes Historical Provider, MD  liothyronine (CYTOMEL) 5 MCG tablet Take 5 mcg by mouth daily.   Yes Historical Provider, MD  Omega-3 Fatty Acids (FISH OIL) 1360 MG CAPS Take 1,600 mg by mouth.   Yes Historical Provider, MD  nitrofurantoin, macrocrystal-monohydrate, (MACROBID) 100 MG capsule Take 1 capsule (100 mg total) by mouth 2 (two) times daily. Patient not taking: Reported on 01/22/2016 01/14/16 01/24/16  Morrell RiddleSarah L Weber, PA-C   Social History   Social History  . Marital status: Married    Spouse name: N/A  . Number of children: 0  . Years of education: 6616   Occupational History  . Data analyst    Social History Main Topics  . Smoking status: Never Smoker  . Smokeless tobacco: Never Used  . Alcohol use 0.0 oz/week     Comment: socially  rare  . Drug use: No  . Sexual activity: Yes    Birth control/ protection: Other-see comments     Comment: husband vasectomy   Other Topics Concern  . Not on file   Social History Narrative   Fun: Garden, read   Denies religious beliefs effecting health care.    Review of Systems  Constitutional: Negative for chills and fever.  Gastrointestinal: Negative for abdominal pain, diarrhea, nausea and vomiting.  Genitourinary: Positive for dysuria and frequency.     Objective:   Physical Exam  Constitutional: She is  oriented to person, place, and time. She appears well-developed and well-nourished. No distress.  HENT:  Head: Normocephalic and atraumatic.  Eyes: Conjunctivae and EOM are normal.  Neck: Neck supple.  Cardiovascular: Normal rate.   Pulmonary/Chest: Effort normal.  Abdominal: Soft. There is no tenderness. There is no CVA tenderness.  Musculoskeletal: Normal range of motion.  Neurological: She is alert and oriented to person, place, and time.  Skin: Skin is warm and dry.  Psychiatric: She has a normal mood and affect. Her behavior is normal.  Nursing note and vitals reviewed.   Vitals:   01/22/16 0847  BP: 122/80  Pulse: 65  Resp: 16  Temp: 98.3 F (36.8 C)  TempSrc: Oral  Weight: 211 lb (95.7 kg)  Height: 5\' 5"  (1.651 m)  '  Results for orders placed or performed in visit on 01/22/16  POCT urinalysis dipstick  Result Value Ref Range   Color, UA yellow yellow   Clarity, UA clear clear   Glucose, UA negative negative   Bilirubin, UA negative negative   Ketones, POC UA negative negative   Spec Grav, UA 1.010    Blood, UA negative negative   pH, UA 7.0    Protein Ur, POC negative negative   Urobilinogen, UA 0.2    Nitrite, UA Negative Negative   Leukocytes, UA Negative Negative  POCT Microscopic Urinalysis ([facility])  Result Value Ref Range   WBC,UR,HPF,POC None None WBC/hpf   RBC,UR,HPF,POC None None RBC/hpf   Bacteria None None, Too numerous to count   Mucus Absent Absent   Epithelial Cells, UR Per Microscopy None None, Too numerous to count cells/hpf    Assessment & Plan:    Olivia Mendez is a 50 y.o. female Urinary frequency - Plan: POCT urinalysis dipstick, POCT Microscopic Urinalysis ([facility]), Urine culture  Recurrent cystitis - Plan: Urine culture  Recurrent versus persistent cystitis with incomplete treatment on prior antibiotic course. Unfortunately urinalysis in office today and possible urine culture may be skewed by her restart of antibiotics yesterday.  We'll continue Macrobid for 1 week course, check urine culture to look into resistant organism, RTC precautions.  Meds ordered this encounter  Medications  . nitrofurantoin, macrocrystal-monohydrate, (MACROBID) 100 MG capsule    Sig: Take 1 capsule (100 mg total) by mouth 2 (two) times daily.    Dispense:  12 capsule    Refill:  0   Patient Instructions       IF you received an x-ray today, you will receive an invoice from [facility]. Please contact [facility] at [phone] with questions or concerns regarding your invoice.   IF you received labwork today, you will receive an invoice from [facility]. Please contact [facility] at [phone] with questions or concerns regarding your invoice.   Our billing staff will not be able to assist you with questions regarding bills from these companies.  You will be contacted with the lab  results as soon as they are available. The fastest way to get your results is to activate your My Chart account. Instructions are located on the last page of this paperwork. If you have not heard from [place] regarding the results in 2 weeks, please contact this office.    I will check a urine culture, but that may be negative as you are already taking antibiotics. Restart Macrobid twice per day, drink plenty of fluids.   Return to the clinic or go to the nearest emergency room if any of your symptoms worsen or new symptoms occur.  Urinary Tract Infection Urinary tract infections (UTIs) can develop anywhere along your urinary tract. Your urinary tract is your body's drainage system for removing wastes and extra water. Your urinary tract includes two kidneys, two ureters, a bladder, and a urethra. Your kidneys are a pair of bean-shaped organs. Each kidney is about the size of your fist. They are located below your ribs, one on each side of your spine. CAUSES Infections are caused by microbes, which are microscopic  organisms, including fungi, viruses, and bacteria. These organisms are so small that they can only be seen through a microscope. Bacteria are the microbes that most commonly cause UTIs. SYMPTOMS  Symptoms of UTIs may vary by age and gender of the patient and by the location of the infection. Symptoms in young women typically include a frequent and intense urge to urinate and a painful, burning feeling in the bladder or urethra during urination. Older women and men are more likely to be tired, shaky, and weak and have muscle aches and abdominal pain. A fever may mean the infection is in your kidneys. Other symptoms of a kidney infection include pain in your back or sides below the ribs, nausea, and vomiting. DIAGNOSIS To diagnose a UTI, your caregiver will ask you about your symptoms. Your caregiver will also ask you to provide a urine sample. The urine sample will be tested for bacteria and white blood cells. White blood cells are made by your body to help fight infection. TREATMENT  Typically, UTIs can be treated with medication. Because most UTIs are caused by a bacterial infection, they usually can be treated with the use of antibiotics. The choice of antibiotic and length of treatment depend on your symptoms and the type of bacteria causing your infection. HOME CARE INSTRUCTIONS  If you were prescribed antibiotics, take them exactly as your caregiver instructs you. Finish the medication even if you feel better after you have only taken some of the medication.  Drink enough water and fluids to keep your urine clear or pale yellow.  Avoid caffeine, tea, and carbonated beverages. They tend to irritate your bladder.  Empty your bladder often. Avoid holding urine for long periods of time.  Empty your bladder before and after sexual intercourse.  After a bowel movement, women should cleanse from front to back. Use each tissue only once. SEEK MEDICAL CARE IF:   You have back pain.  You develop a  fever.  Your symptoms do not begin to resolve within 3 days. SEEK IMMEDIATE MEDICAL CARE IF:   You have severe back pain or lower abdominal pain.  You develop chills.  You have nausea or vomiting.  You have continued burning or discomfort with urination. MAKE SURE YOU:   Understand these instructions.  Will watch your condition.  Will get help right away if you are not doing well or get worse.   This information is not intended to  replace advice given to you by your health care provider. Make sure you discuss any questions you have with your health care provider.   Document Released: 03/01/2005 Document Revised: 02/10/2015 Document Reviewed: 06/30/2011 Elsevier Interactive Patient Education [employer]    I personally performed the services described in this documentation, which was scribed in my presence. The recorded information has been reviewed and considered, and addended by me as needed.   Signed,   Meredith Staggers, MD [facility].  01/22/16 9:33 AM

## 2016-01-22 NOTE — Patient Instructions (Addendum)
IF you received an x-ray today, you will receive an invoice from [facility]. Please contact [facility] at [phone] with questions or concerns regarding your invoice.   IF you received labwork today, you will receive an invoice from [facility]. Please contact [facility] at [phone] with questions or concerns regarding your invoice.   Our billing staff will not be able to assist you with questions regarding bills from these companies.  You will be contacted with the lab results as soon as they are available. The fastest way to get your results is to activate your My Chart account. Instructions are located on the last page of this paperwork. If you have not heard from us regarding the results in 2 weeks, please contact this office.    I will check a urine culture, but that may be negative as you are already taking antibiotics. Restart Macrobid twice per day, drink plenty of fluids.   Return to the clinic or go to the nearest emergency room if any of your symptoms worsen or new symptoms occur.  Urinary Tract Infection Urinary tract infections (UTIs) can develop anywhere along your urinary tract. Your urinary tract is your body's drainage system for removing wastes and extra water. Your urinary tract includes two kidneys, two ureters, a bladder, and a urethra. Your kidneys are a pair of bean-shaped organs. Each kidney is about the size of your fist. They are located below your ribs, one on each side of your spine. CAUSES Infections are caused by microbes, which are microscopic organisms, including fungi, viruses, and bacteria. These organisms are so small that they can only be seen through a microscope. Bacteria are the microbes that most commonly cause UTIs. SYMPTOMS  Symptoms of UTIs may vary by age and gender of the patient and by the location of the infection. Symptoms in young women typically include a frequent and intense urge to urinate  and a painful, burning feeling in the bladder or urethra during urination. Older women and men are more likely to be tired, shaky, and weak and have muscle aches and abdominal pain. A fever may mean the infection is in your kidneys. Other symptoms of a kidney infection include pain in your back or sides below the ribs, nausea, and vomiting. DIAGNOSIS To diagnose a UTI, your caregiver will ask you about your symptoms. Your caregiver will also ask you to provide a urine sample. The urine sample will be tested for bacteria and white blood cells. White blood cells are made by your body to help fight infection. TREATMENT  Typically, UTIs can be treated with medication. Because most UTIs are caused by a bacterial infection, they usually can be treated with the use of antibiotics. The choice of antibiotic and length of treatment depend on your symptoms and the type of bacteria causing your infection. HOME CARE INSTRUCTIONS  If you were prescribed antibiotics, take them exactly as your caregiver instructs you. Finish the medication even if you feel better after you have only taken some of the medication.  Drink enough water and fluids to keep your urine clear or pale yellow.  Avoid caffeine, tea, and carbonated beverages. They tend to irritate your bladder.  Empty your bladder often. Avoid holding urine for long periods of time.  Empty your bladder before and after sexual intercourse.  After a bowel movement, women should cleanse from front to back. Use each tissue only once. SEEK MEDICAL CARE IF:   You have back pain.  You  develop a fever.  Your symptoms do not begin to resolve within 3 days. SEEK IMMEDIATE MEDICAL CARE IF:   You have severe back pain or lower abdominal pain.  You develop chills.  You have nausea or vomiting.  You have continued burning or discomfort with urination. MAKE SURE YOU:   Understand these instructions.  Will watch your condition.  Will get help right away  if you are not doing well or get worse.   This information is not intended to replace advice given to you by your health care provider. Make sure you discuss any questions you have with your health care provider.   Document Released: 03/01/2005 Document Revised: 02/10/2015 Document Reviewed: 06/30/2011 Elsevier Interactive Patient Education [employer]

## 2016-01-23 LAB — URINE CULTURE

## 2016-06-05 DIAGNOSIS — I719 Aortic aneurysm of unspecified site, without rupture: Secondary | ICD-10-CM

## 2016-06-05 HISTORY — DX: Aortic aneurysm of unspecified site, without rupture: I71.9

## 2016-12-05 ENCOUNTER — Ambulatory Visit (INDEPENDENT_AMBULATORY_CARE_PROVIDER_SITE_OTHER): Payer: Managed Care, Other (non HMO) | Admitting: Women's Health

## 2016-12-05 ENCOUNTER — Encounter: Payer: Self-pay | Admitting: Women's Health

## 2016-12-05 VITALS — BP 122/78 | Ht 68.0 in | Wt 217.0 lb

## 2016-12-05 DIAGNOSIS — Z01419 Encounter for gynecological examination (general) (routine) without abnormal findings: Secondary | ICD-10-CM | POA: Diagnosis not present

## 2016-12-05 DIAGNOSIS — Z23 Encounter for immunization: Secondary | ICD-10-CM

## 2016-12-05 NOTE — Patient Instructions (Signed)
Health Maintenance for Postmenopausal Women Menopause is a normal process in which your reproductive ability comes to an end. This process happens gradually over a span of months to years, usually between the ages of 22 and 9. Menopause is complete when you have missed 12 consecutive menstrual periods. It is important to talk with your health care provider about some of the most common conditions that affect postmenopausal women, such as heart disease, cancer, and bone loss (osteoporosis). Adopting a healthy lifestyle and getting preventive care can help to promote your health and wellness. Those actions can also lower your chances of developing some of these common conditions. What should I know about menopause? During menopause, you may experience a number of symptoms, such as:  Moderate-to-severe hot flashes.  Night sweats.  Decrease in sex drive.  Mood swings.  Headaches.  Tiredness.  Irritability.  Memory problems.  Insomnia.  Choosing to treat or not to treat menopausal changes is an individual decision that you make with your health care provider. What should I know about hormone replacement therapy and supplements? Hormone therapy products are effective for treating symptoms that are associated with menopause, such as hot flashes and night sweats. Hormone replacement carries certain risks, especially as you become older. If you are thinking about using estrogen or estrogen with progestin treatments, discuss the benefits and risks with your health care provider. What should I know about heart disease and stroke? Heart disease, heart attack, and stroke become more likely as you age. This may be due, in part, to the hormonal changes that your body experiences during menopause. These can affect how your body processes dietary fats, triglycerides, and cholesterol. Heart attack and stroke are both medical emergencies. There are many things that you can do to help prevent heart disease  and stroke:  Have your blood pressure checked at least every 1-2 years. High blood pressure causes heart disease and increases the risk of stroke.  If you are 53-22 years old, ask your health care provider if you should take aspirin to prevent a heart attack or a stroke.  Do not use any tobacco products, including cigarettes, chewing tobacco, or electronic cigarettes. If you need help quitting, ask your health care provider.  It is important to eat a healthy diet and maintain a healthy weight. ? Be sure to include plenty of vegetables, fruits, low-fat dairy products, and lean protein. ? Avoid eating foods that are high in solid fats, added sugars, or salt (sodium).  Get regular exercise. This is one of the most important things that you can do for your health. ? Try to exercise for at least 150 minutes each week. The type of exercise that you do should increase your heart rate and make you sweat. This is known as moderate-intensity exercise. ? Try to do strengthening exercises at least twice each week. Do these in addition to the moderate-intensity exercise.  Know your numbers.Ask your health care provider to check your cholesterol and your blood glucose. Continue to have your blood tested as directed by your health care provider.  What should I know about cancer screening? There are several types of cancer. Take the following steps to reduce your risk and to catch any cancer development as early as possible. Breast Cancer  Practice breast self-awareness. ? This means understanding how your breasts normally appear and feel. ? It also means doing regular breast self-exams. Let your health care provider know about any changes, no matter how small.  If you are 40  or older, have a clinician do a breast exam (clinical breast exam or CBE) every year. Depending on your age, family history, and medical history, it may be recommended that you also have a yearly breast X-ray (mammogram).  If you  have a family history of breast cancer, talk with your health care provider about genetic screening.  If you are at high risk for breast cancer, talk with your health care provider about having an MRI and a mammogram every year.  Breast cancer (BRCA) gene test is recommended for women who have family members with BRCA-related cancers. Results of the assessment will determine the need for genetic counseling and BRCA1 and for BRCA2 testing. BRCA-related cancers include these types: ? Breast. This occurs in males or females. ? Ovarian. ? Tubal. This may also be called fallopian tube cancer. ? Cancer of the abdominal or pelvic lining (peritoneal cancer). ? Prostate. ? Pancreatic.  Cervical, Uterine, and Ovarian Cancer Your health care provider may recommend that you be screened regularly for cancer of the pelvic organs. These include your ovaries, uterus, and vagina. This screening involves a pelvic exam, which includes checking for microscopic changes to the surface of your cervix (Pap test).  For women ages 21-65, health care providers may recommend a pelvic exam and a Pap test every three years. For women ages 79-65, they may recommend the Pap test and pelvic exam, combined with testing for human papilloma virus (HPV), every five years. Some types of HPV increase your risk of cervical cancer. Testing for HPV may also be done on women of any age who have unclear Pap test results.  Other health care providers may not recommend any screening for nonpregnant women who are considered low risk for pelvic cancer and have no symptoms. Ask your health care provider if a screening pelvic exam is right for you.  If you have had past treatment for cervical cancer or a condition that could lead to cancer, you need Pap tests and screening for cancer for at least 20 years after your treatment. If Pap tests have been discontinued for you, your risk factors (such as having a new sexual partner) need to be  reassessed to determine if you should start having screenings again. Some women have medical problems that increase the chance of getting cervical cancer. In these cases, your health care provider may recommend that you have screening and Pap tests more often.  If you have a family history of uterine cancer or ovarian cancer, talk with your health care provider about genetic screening.  If you have vaginal bleeding after reaching menopause, tell your health care provider.  There are currently no reliable tests available to screen for ovarian cancer.  Lung Cancer Lung cancer screening is recommended for adults 69-62 years old who are at high risk for lung cancer because of a history of smoking. A yearly low-dose CT scan of the lungs is recommended if you:  Currently smoke.  Have a history of at least 30 pack-years of smoking and you currently smoke or have quit within the past 15 years. A pack-year is smoking an average of one pack of cigarettes per day for one year.  Yearly screening should:  Continue until it has been 15 years since you quit.  Stop if you develop a health problem that would prevent you from having lung cancer treatment.  Colorectal Cancer  This type of cancer can be detected and can often be prevented.  Routine colorectal cancer screening usually begins at  age 42 and continues through age 45.  If you have risk factors for colon cancer, your health care provider may recommend that you be screened at an earlier age.  If you have a family history of colorectal cancer, talk with your health care provider about genetic screening.  Your health care provider may also recommend using home test kits to check for hidden blood in your stool.  A small camera at the end of a tube can be used to examine your colon directly (sigmoidoscopy or colonoscopy). This is done to check for the earliest forms of colorectal cancer.  Direct examination of the colon should be repeated every  5-10 years until age 71. However, if early forms of precancerous polyps or small growths are found or if you have a family history or genetic risk for colorectal cancer, you may need to be screened more often.  Skin Cancer  Check your skin from head to toe regularly.  Monitor any moles. Be sure to tell your health care provider: ? About any new moles or changes in moles, especially if there is a change in a mole's shape or color. ? If you have a mole that is larger than the size of a pencil eraser.  If any of your family members has a history of skin cancer, especially at a young age, talk with your health care provider about genetic screening.  Always use sunscreen. Apply sunscreen liberally and repeatedly throughout the day.  Whenever you are outside, protect yourself by wearing long sleeves, pants, a wide-brimmed hat, and sunglasses.  What should I know about osteoporosis? Osteoporosis is a condition in which bone destruction happens more quickly than new bone creation. After menopause, you may be at an increased risk for osteoporosis. To help prevent osteoporosis or the bone fractures that can happen because of osteoporosis, the following is recommended:  If you are 46-71 years old, get at least 1,000 mg of calcium and at least 600 mg of vitamin D per day.  If you are older than age 55 but younger than age 65, get at least 1,200 mg of calcium and at least 600 mg of vitamin D per day.  If you are older than age 54, get at least 1,200 mg of calcium and at least 800 mg of vitamin D per day.  Smoking and excessive alcohol intake increase the risk of osteoporosis. Eat foods that are rich in calcium and vitamin D, and do weight-bearing exercises several times each week as directed by your health care provider. What should I know about how menopause affects my mental health? Depression may occur at any age, but it is more common as you become older. Common symptoms of depression  include:  Low or sad mood.  Changes in sleep patterns.  Changes in appetite or eating patterns.  Feeling an overall lack of motivation or enjoyment of activities that you previously enjoyed.  Frequent crying spells.  Talk with your health care provider if you think that you are experiencing depression. What should I know about immunizations? It is important that you get and maintain your immunizations. These include:  Tetanus, diphtheria, and pertussis (Tdap) booster vaccine.  Influenza every year before the flu season begins.  Pneumonia vaccine.  Shingles vaccine.  Your health care provider may also recommend other immunizations. This information is not intended to replace advice given to you by your health care provider. Make sure you discuss any questions you have with your health care provider. Document Released: 07/14/2005  Document Revised: 12/10/2015 Document Reviewed: 02/23/2015 Elsevier Interactive Patient Education  2018 Elsevier Inc.  

## 2016-12-05 NOTE — Progress Notes (Signed)
Olivia Mendez 10/15/1965 [id number]    History:    Presents for annual exam.  Postmenopausal on no HRT with no bleeding. History of infertility. Normal Pap and mammogram history. 2010 negative colonoscopy. Endocrinologist manages hypothyroidism. Primary care manages GERD. Has annual skin checks.  Past medical history, past surgical history, family history and social history were all reviewed and documented in the EPIC chart. Works for [employer]. 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 Mother diabetes.  ROS:  A ROS was performed and pertinent positives and negatives are included.  Exam:  Vitals:   12/05/16 1604  BP: 122/78  Weight: 217 lb (98.4 kg)  Height: 5\' 8"  (1.727 m)   Body mass index is 32.99 kg/m.   General appearance:  Normal Thyroid:  Symmetrical, normal in size, without palpable masses or nodularity. Respiratory  Auscultation:  Clear without wheezing or rhonchi Cardiovascular  Auscultation:  Regular rate, without rubs, murmurs or gallops  Edema/varicosities:  Not grossly evident Abdominal  Soft,nontender, without masses, guarding or rebound.  Liver/spleen:  No organomegaly noted  Hernia:  None appreciated  Skin  Inspection:  Grossly normal   Breasts: Examined lying and sitting.     Right: Without masses, retractions, discharge or axillary adenopathy.     Left: Without masses, retractions, discharge or axillary adenopathy. Gentitourinary   Inguinal/mons:  Normal without inguinal adenopathy  External genitalia:  Normal  BUS/Urethra/Skene's glands:  Normal  Vagina:  Normal  Cervix:  Normal  Uterus: normal in size, shape and contour.  Midline and mobile  Adnexa/parametria:     Rt: Without masses or tenderness.   Lt: Without masses or tenderness.  Anus and perineum: Normal  Digital rectal exam: Normal sphincter tone without palpated masses or tenderness  Assessment/Plan:  51 y.o. MWF G0 for annual exam with no complaints.  Postmenopausal/no HRT/no bleeding 2003  endometriomas Hypothyroid-endocrinologist GERD primary care manages labs Obesity  Plan: SBE's, overdue for annual screening mammogram instructed to schedule. Calcium rich diet, vitamin D 2000 daily encouraged. Reviewed importance of increasing exercise and decreasing calories for weight loss. Pap with HR HPV typing, new screening guidelines reviewed.  Harrington Challengerancy J Young [facility] CenterWHNP, 4:44 PM 12/05/2016

## 2016-12-05 NOTE — Addendum Note (Signed)
Addended by: Kem ParkinsonBARNES, Peytan Andringa on: 12/05/2016 04:58 PM   Modules accepted: Orders

## 2016-12-06 LAB — URINALYSIS W MICROSCOPIC + REFLEX CULTURE
Bacteria, UA: NONE SEEN [HPF]
Bilirubin Urine: NEGATIVE
Casts: NONE SEEN [LPF]
Crystals: NONE SEEN [HPF]
GLUCOSE, UA: NEGATIVE
Hgb urine dipstick: NEGATIVE
Ketones, ur: NEGATIVE
LEUKOCYTES UA: NEGATIVE
Nitrite: NEGATIVE
PROTEIN: NEGATIVE
RBC / HPF: NONE SEEN RBC/HPF (ref <=2)
Specific Gravity, Urine: 1.015 (ref 1.001–1.035)
Squamous Epithelial / LPF: NONE SEEN [HPF] (ref <=5)
YEAST: NONE SEEN [HPF]
pH: 5.5 (ref 5.0–8.0)

## 2016-12-07 LAB — URINE CULTURE

## 2016-12-08 LAB — PAP, TP IMAGING W/ HPV RNA, RFLX HPV TYPE 16,18/45: HPV MRNA, HIGH RISK: NOT DETECTED

## 2016-12-25 ENCOUNTER — Ambulatory Visit (INDEPENDENT_AMBULATORY_CARE_PROVIDER_SITE_OTHER): Payer: Managed Care, Other (non HMO) | Admitting: Internal Medicine

## 2016-12-25 ENCOUNTER — Ambulatory Visit (INDEPENDENT_AMBULATORY_CARE_PROVIDER_SITE_OTHER)
Admission: RE | Admit: 2016-12-25 | Discharge: 2016-12-25 | Disposition: A | Discharge status: Home / Self Care | Payer: Managed Care, Other (non HMO) | Source: Ambulatory Visit | Attending: Internal Medicine | Admitting: Internal Medicine

## 2016-12-25 ENCOUNTER — Encounter: Payer: Self-pay | Admitting: Internal Medicine

## 2016-12-25 VITALS — BP 130/76 | HR 61 | Temp 97.9°F | Resp 14 | Ht 68.0 in | Wt 217.0 lb

## 2016-12-25 DIAGNOSIS — R059 Cough, unspecified: Secondary | ICD-10-CM

## 2016-12-25 DIAGNOSIS — R05 Cough: Secondary | ICD-10-CM

## 2016-12-25 DIAGNOSIS — B9789 Other viral agents as the cause of diseases classified elsewhere: Secondary | ICD-10-CM | POA: Diagnosis not present

## 2016-12-25 DIAGNOSIS — J069 Acute upper respiratory infection, unspecified: Secondary | ICD-10-CM | POA: Diagnosis not present

## 2016-12-25 MED ORDER — PROMETHAZINE-DM 6.25-15 MG/5ML PO SYRP
5.0000 mL | ORAL_SOLUTION | Freq: Four times a day (QID) | ORAL | 0 refills | Status: DC | PRN
Start: 2016-12-25 — End: 2017-02-07

## 2016-12-25 NOTE — Patient Instructions (Signed)
Cough, Adult Coughing is a reflex that clears your throat and your airways. Coughing helps to heal and protect your lungs. It is normal to cough occasionally, but a cough that happens with other symptoms or lasts a long time may be a sign of a condition that needs treatment. A cough may last only 2-3 weeks (acute), or it may last longer than 8 weeks (chronic). What are the causes? Coughing is commonly caused by:  Breathing in substances that irritate your lungs.  A viral or bacterial respiratory infection.  Allergies.  Asthma.  Postnasal drip.  Smoking.  Acid backing up from the stomach into the esophagus (gastroesophageal reflux).  Certain medicines.  Chronic lung problems, including COPD (or rarely, lung cancer).  Other medical conditions such as heart failure.  Follow these instructions at home: Pay attention to any changes in your symptoms. Take these actions to help with your discomfort:  Take medicines only as told by your health care provider. ? If you were prescribed an antibiotic medicine, take it as told by your health care provider. Do not stop taking the antibiotic even if you start to feel better. ? Talk with your health care provider before you take a cough suppressant medicine.  Drink enough fluid to keep your urine clear or pale yellow.  If the air is dry, use a cold steam vaporizer or humidifier in your bedroom or your home to help loosen secretions.  Avoid anything that causes you to cough at work or at home.  If your cough is worse at night, try sleeping in a semi-upright position.  Avoid cigarette smoke. If you smoke, quit smoking. If you need help quitting, ask your health care provider.  Avoid caffeine.  Avoid alcohol.  Rest as needed.  Contact a health care provider if:  You have new symptoms.  You cough up pus.  Your cough does not get better after 2-3 weeks, or your cough gets worse.  You cannot control your cough with suppressant  medicines and you are losing sleep.  You develop pain that is getting worse or pain that is not controlled with pain medicines.  You have a fever.  You have unexplained weight loss.  You have night sweats. Get help right away if:  You cough up blood.  You have difficulty breathing.  Your heartbeat is very fast. This information is not intended to replace advice given to you by your health care provider. Make sure you discuss any questions you have with your health care provider. Document Released: 11/18/2010 Document Revised: 10/28/2015 Document Reviewed: 07/29/2014 Elsevier Interactive Patient Education  2017 Elsevier Inc.  

## 2016-12-25 NOTE — Progress Notes (Signed)
Subjective:  Patient ID: Olivia Mendez, female    DOB: Mar 19, 1966  Age: 51 y.o. MRN: [id number]  CC: Acute Visit (cough/congestion since around friday of last week ); URI; and Cough   HPI [name] presents for a 2 day history of nonproductive cough with low-grade fever, chills, sore throat, and headache. She denies shortness of breath, chest pain, or hemoptysis.  Outpatient Medications Prior to Visit  Medication Sig Dispense Refill  . b complex vitamins tablet Take 1 tablet by mouth daily.    [name] Bioflavonoid Products (C-COMPLEX PO) Take 1,000 mg by mouth.    . Cholecalciferol (VITAMIN D-3 PO) Take 2,000 Units by mouth.    . co-enzyme Q-10 30 MG capsule Take 200 mg by mouth 3 (three) times daily.    [name] levothyroxine (SYNTHROID, LEVOTHROID) 150 MCG tablet Take 150 mcg by mouth daily.    [name] liothyronine (CYTOMEL) 5 MCG tablet Take 5 mcg by mouth daily.    . Omega-3 Fatty Acids (FISH OIL) 1360 MG CAPS Take 1,600 mg by mouth.     No facility-administered medications prior to visit.     ROS Review of Systems  Constitutional: Positive for chills and fever. Negative for activity change, appetite change, diaphoresis, fatigue and unexpected weight change.  HENT: Positive for sore throat. Negative for facial swelling, sinus pressure and trouble swallowing.   Eyes: Negative.  Negative for visual disturbance.  Respiratory: Positive for cough. Negative for chest tightness, shortness of breath, wheezing and stridor.   Cardiovascular: Negative for chest pain, palpitations and leg swelling.  Gastrointestinal: Negative for abdominal pain, constipation, diarrhea, nausea and vomiting.  Endocrine: Negative.   Genitourinary: Negative.  Negative for difficulty urinating.  Musculoskeletal: Negative for back pain and myalgias.  Skin: Negative.  Negative for color change and rash.  Allergic/Immunologic: Negative.   Neurological: Positive for headaches. Negative for dizziness and weakness.    Hematological: Negative for adenopathy. Does not bruise/bleed easily.  Psychiatric/Behavioral: Negative.     Objective:  BP 130/76 (BP Location: Left Arm, Patient Position: Sitting, Cuff Size: Normal)   Pulse 61   Temp 97.9 F (36.6 C) (Oral)   Resp 14   Ht 5\' 8"  (1.727 m)   Wt 217 lb (98.4 kg)   LMP 10/03/2008   SpO2 99%   BMI 32.99 kg/m   BP Readings from Last 3 Encounters:  12/25/16 130/76  12/05/16 122/78  01/22/16 122/80    Wt Readings from Last 3 Encounters:  12/25/16 217 lb (98.4 kg)  12/05/16 217 lb (98.4 kg)  01/22/16 211 lb (95.7 kg)    Physical Exam  Constitutional: She is oriented to person, place, and time.  Non-toxic appearance. She does not have a sickly appearance. She does not appear ill. No distress.  HENT:  Right Ear: Hearing, tympanic membrane, external ear and ear canal normal.  Left Ear: Hearing, tympanic membrane, external ear and ear canal normal.  Mouth/Throat: Mucous membranes are normal. Mucous membranes are not pale and not cyanotic. No oral lesions. No trismus in the jaw. No uvula swelling. Posterior oropharyngeal erythema present. No oropharyngeal exudate, posterior oropharyngeal edema or tonsillar abscesses.  Eyes: Conjunctivae are normal. Right eye exhibits no discharge. Left eye exhibits no discharge. No scleral icterus.  Neck: Normal range of motion. Neck supple. No JVD present. No thyromegaly present.  Cardiovascular: Normal rate, regular rhythm and intact distal pulses.  Exam reveals no gallop.   No murmur heard. Pulmonary/Chest: Effort normal and breath sounds normal. No respiratory  distress. She has no wheezes. She has no rales. She exhibits no tenderness.  Abdominal: Soft. Bowel sounds are normal. She exhibits no distension and no mass. There is no tenderness. There is no rebound and no guarding.  Musculoskeletal: Normal range of motion. She exhibits no edema, tenderness or deformity.  Lymphadenopathy:    She has no cervical  adenopathy.  Neurological: She is alert and oriented to person, place, and time.  Skin: Skin is warm and dry. No rash noted. She is not diaphoretic. No erythema. No pallor.  Vitals reviewed.   Lab Results  Component Value Date   WBC 6.9 11/13/2014   HGB 14.1 11/13/2014   HCT 41.4 11/13/2014   PLT 314.0 11/13/2014   GLUCOSE 88 11/13/2014   CHOL 180 07/24/2012   TRIG 68 07/24/2012   HDL 62 07/24/2012   LDLCALC 104 (H) 07/24/2012   ALT 46 (H) 11/13/2014   AST 29 11/13/2014   NA 138 11/13/2014   K 4.6 11/13/2014   CL 104 11/13/2014   CREATININE 0.73 11/13/2014   BUN 12 11/13/2014   CO2 28 11/13/2014   TSH 2.12 11/13/2014   INR 0.9 ratio 09/14/2008   HGBA1C 5.5 11/13/2014    [place] Venous Img Lower Unilateral Left  Result Date: 02/10/2015 CLINICAL DATA:  Pain and edema left calf EXAM: Left LOWER EXTREMITY VENOUS DOPPLER ULTRASOUND TECHNIQUE: Gray-scale sonography with graded compression, as well as color Doppler and duplex ultrasound were performed to evaluate the lower extremity deep venous systems from the level of the common femoral vein and including the common femoral, femoral, profunda femoral, popliteal and calf veins including the posterior tibial, peroneal and gastrocnemius veins when visible. The superficial great saphenous vein was also interrogated. Spectral Doppler was utilized to evaluate flow at rest and with distal augmentation maneuvers in the common femoral, femoral and popliteal veins. COMPARISON:  None FINDINGS: Contralateral Common Femoral Vein: Respiratory phasicity is normal and symmetric with the symptomatic side. No evidence of thrombus. Normal compressibility. Common Femoral Vein: No evidence of thrombus. Normal compressibility, respiratory phasicity and response to augmentation. Saphenofemoral Junction: No evidence of thrombus. Normal compressibility and flow on color Doppler imaging. Profunda Femoral Vein: No evidence of thrombus. Normal compressibility and flow on  color Doppler imaging. Femoral Vein: No evidence of thrombus. Normal compressibility, respiratory phasicity and response to augmentation. Popliteal Vein: No evidence of thrombus. Normal compressibility, respiratory phasicity and response to augmentation. Calf Veins: No evidence of thrombus. Normal compressibility and flow on color Doppler imaging. Superficial Great Saphenous Vein: No evidence of thrombus. Normal compressibility and flow on color Doppler imaging. Venous Reflux:  None. Other Findings:  None. IMPRESSION: No evidence of deep venous thrombosis. Electronically Signed   By: Marlan Palauharles  Clark M.D.   On: 02/10/2015 11:21   Dg Foot Complete Right  Result Date: 02/11/2015 CLINICAL DATA:  Lateral right foot pain, no acute injury EXAM: RIGHT FOOT COMPLETE - 3+ VIEW COMPARISON:  None. FINDINGS: Tarsal-metatarsal alignment is normal. Joint spaces appear normal. No fracture is seen. There is a small plantar calcaneal degenerative spur present. IMPRESSION: No acute abnormality.  Small plantar calcaneal degenerative spur. Electronically Signed   By: Dwyane DeePaul  Barry M.D.   On: 02/11/2015 13:29    Assessment & Plan:   Dwana CurdVera was seen today for acute visit, uri and cough.  Diagnoses and all orders for this visit:  Cough- her chest x-ray is normal, will treat as viral URI. -     promethazine-dextromethorphan (PROMETHAZINE-DM) 6.25-15 MG/5ML syrup; Take 5  mLs by mouth 4 (four) times daily as needed for cough. -     DG Chest 2 View; Future  Viral URI with cough -     promethazine-dextromethorphan (PROMETHAZINE-DM) 6.25-15 MG/5ML syrup; Take 5 mLs by mouth 4 (four) times daily as needed for cough.   I am having Ms. Nippert start on promethazine-dextromethorphan. I am also having her maintain her levothyroxine, liothyronine, Fish Oil, Cholecalciferol (VITAMIN D-3 PO), co-enzyme Q-10, Bioflavonoid Products (C-COMPLEX PO), and b complex vitamins.  Meds ordered this encounter  Medications  .  promethazine-dextromethorphan (PROMETHAZINE-DM) 6.25-15 MG/5ML syrup    Sig: Take 5 mLs by mouth 4 (four) times daily as needed for cough.    Dispense:  118 mL    Refill:  0     Follow-up: Return if symptoms worsen or fail to improve.  Sanda Linger, MD

## 2016-12-26 ENCOUNTER — Encounter: Payer: Self-pay | Admitting: Internal Medicine

## 2017-02-07 ENCOUNTER — Other Ambulatory Visit: Payer: Self-pay | Admitting: Internal Medicine

## 2017-02-07 ENCOUNTER — Encounter: Payer: Self-pay | Admitting: Internal Medicine

## 2017-02-07 ENCOUNTER — Telehealth: Payer: Self-pay

## 2017-02-07 ENCOUNTER — Ambulatory Visit (INDEPENDENT_AMBULATORY_CARE_PROVIDER_SITE_OTHER): Payer: Managed Care, Other (non HMO) | Admitting: Internal Medicine

## 2017-02-07 ENCOUNTER — Ambulatory Visit (INDEPENDENT_AMBULATORY_CARE_PROVIDER_SITE_OTHER)
Admission: RE | Admit: 2017-02-07 | Discharge: 2017-02-07 | Disposition: A | Discharge status: Home / Self Care | Payer: Managed Care, Other (non HMO) | Source: Ambulatory Visit | Attending: Internal Medicine | Admitting: Internal Medicine

## 2017-02-07 VITALS — BP 118/80 | HR 66 | Temp 98.2°F | Ht 68.0 in | Wt 210.0 lb

## 2017-02-07 DIAGNOSIS — R079 Chest pain, unspecified: Secondary | ICD-10-CM | POA: Diagnosis not present

## 2017-02-07 DIAGNOSIS — K219 Gastro-esophageal reflux disease without esophagitis: Secondary | ICD-10-CM

## 2017-02-07 DIAGNOSIS — M79645 Pain in left finger(s): Secondary | ICD-10-CM

## 2017-02-07 MED ORDER — DICLOFENAC SODIUM 1 % TD GEL: 4.0000 g | Freq: Four times a day (QID) | TRANSDERMAL | 1 refills | Status: DC | PRN

## 2017-02-07 NOTE — Progress Notes (Signed)
Subjective:    Patient ID: Olivia Mendez, female    DOB: 06/27/1965, 51 y.o.   MRN: [id number]  HPI  Here to f/u after an unfortunate incident involving a table overturning quickly while being taken down, striking the distal middle finger left hand very hard, with marked pain that has actually seemed to have gotten worse with use of the hand since then, assoc with pain and swelling and "bumps" to the DIP, which now has marked reduced ROM as well.  No other trauma, hx of gout.and denies fever.   Also c/o vague mid sternal and upper sternal area dull sensation unusual for her x 2 wks, constant, mild, without radiation, n/v, diaphoresis, palp or dizziness.  Discomfort is non postional, nonpleuritic and non exertional.  Has not tried antacid,  Nothing else makes better or worse. Denies worsening reflux, abd pain, dysphagia, bowel change or blood. Past Medical History:  Diagnosis Date  . Allergic rhinitis 12/26/2014  . Endometriosis 1990's  . Hypothyroid   . Migraines    Past Surgical History:  Procedure Laterality Date  . APPENDECTOMY    . CHOLECYSTECTOMY    . OTHER SURGICAL HISTORY     surgery for Endometriosis    reports that she has never smoked. She has never used smokeless tobacco. She reports that she drinks alcohol. She reports that she does not use drugs. family history includes Cancer in her paternal grandmother; Diabetes in her mother; Healthy in her father; Heart disease in her maternal grandfather and paternal grandfather; Multiple sclerosis in her maternal grandmother. Allergies  Allergen Reactions  . Vicodin [Hydrocodone-Acetaminophen]    Current Outpatient Prescriptions on File Prior to Visit  Medication Sig Dispense Refill  . b complex vitamins tablet Take 1 tablet by mouth daily.    [name]. Bioflavonoid Products (C-COMPLEX PO) Take 1,000 mg by mouth.    . Cholecalciferol (VITAMIN D-3 PO) Take 2,000 Units by mouth.    . co-enzyme Q-10 30 MG capsule Take 200 mg by mouth 3 (three)  times daily.    [name]. levothyroxine (SYNTHROID, LEVOTHROID) 150 MCG tablet Take 150 mcg by mouth daily.    [name]. liothyronine (CYTOMEL) 5 MCG tablet Take 5 mcg by mouth daily.    . Omega-3 Fatty Acids (FISH OIL) 1360 MG CAPS Take 1,600 mg by mouth.     No current facility-administered medications on file prior to visit.    Review of Systems  Constitutional: Negative for other unusual diaphoresis or sweats HENT: Negative for ear discharge or swelling Eyes: Negative for other worsening visual disturbances Respiratory: Negative for stridor or other swelling  Gastrointestinal: Negative for worsening distension or other blood Genitourinary: Negative for retention or other urinary change Musculoskeletal: Negative for other MSK pain or swelling Skin: Negative for color change or other new lesions Neurological: Negative for worsening tremors and other numbness  Psychiatric/Behavioral: Negative for worsening agitation or other fatigue All other system neg per pt    Objective:   Physical Exam BP 118/80   Pulse 66   Temp 98.2 F (36.8 C) (Oral)   Ht 5\' 8"  (1.727 m)   Wt 210 lb (95.3 kg)   LMP 10/03/2008   SpO2 98%   BMI 31.93 kg/m  VS noted,  Constitutional: Pt appears in NAD HENT: Head: NCAT.  Right Ear: External ear normal.  Left Ear: External ear normal.  Eyes: . Pupils are equal, round, and reactive to light. Conjunctivae and EOM are normal Nose: without d/c or deformity Neck: Neck supple.  Gross normal ROM Cardiovascular: Normal rate and regular rhythm.   Pulmonary/Chest: Effort normal and breath sounds without rales or wheezing.  Abd:  Soft, NT, ND, + BS, no organomegaly Neurological: Pt is alert. At baseline orientation, motor grossly intact Left hand middle finger DIP with 1-2+ tender swelling with reduced ROM and some swelling to tip of finger as well, o/w neurovasc intacty Skin: Skin is warm. No rashes, other new lesions, no LE edema Psychiatric: Pt behavior is normal without  agitation  No other exam findings  ECG today I have personally interpreted NSR - no ischemic acute changes    Assessment & Plan:

## 2017-02-07 NOTE — Patient Instructions (Addendum)
Your EKG was Ok today  Please take all new medication as prescribed - the topical pain gel as needed  Please continue all other medications as before, and refills have been done if requested.  Please have the pharmacy call with any other refills you may need.  You will be contacted regarding the referral for: Hand Surgury  Please keep your appointments with your specialists as you may have planned  Please go to the XRAY Department in the Basement (go straight as you get off the elevator) for the x-ray testing  You will be contacted by phone if any changes need to be made immediately.  Otherwise, you will receive a letter about your results with an explanation, but please check with MyChart first.  Please remember to sign up for MyChart if you have not done so, as this will be important to you in the future with finding out test results, communicating by private email, and scheduling acute appointments online when needed.

## 2017-02-07 NOTE — Telephone Encounter (Signed)
Called pt, LVM.   

## 2017-02-07 NOTE — Assessment & Plan Note (Signed)
Exam c/w post traumatic effusion vs fracture; for film today, consider hand surgury referral

## 2017-02-07 NOTE — Telephone Encounter (Signed)
-----   Message from Corwin LevinsJames W John, MD sent at 02/07/2017  5:57 PM EDT ----- Left message on MyChart, pt to cont same tx except  The test results show that your current treatment is OK, except the chest xray is somewhat suggestive of a possible area in the mid chest of lymph nodes?  We should do  A CT scan of the chest to see if this is true.  I will order, and you should hear from the office about this as well.  We also need a kidney function test (BMP) [facility] before the CT scan, to be able to have the contrast involved with the CT scan.  Taler Kushner to please inform pt, I will do order for CT scan, but pt needs BMP done prior at the lab as well

## 2017-02-07 NOTE — Assessment & Plan Note (Signed)
Atypical, etiology unclear, ecg reviewed, doubt cardiac and declines stress test as well, will have cxr today

## 2017-02-07 NOTE — Assessment & Plan Note (Signed)
With few intermittent low grade symptoms at worst, pt prefers TUMS prn,  to f/u any worsening symptoms or concerns

## 2017-02-07 NOTE — Telephone Encounter (Signed)
Key: [id number] Approved through 02/07/18

## 2017-02-08 ENCOUNTER — Other Ambulatory Visit (INDEPENDENT_AMBULATORY_CARE_PROVIDER_SITE_OTHER): Payer: Managed Care, Other (non HMO)

## 2017-02-08 DIAGNOSIS — M79645 Pain in left finger(s): Secondary | ICD-10-CM

## 2017-02-08 DIAGNOSIS — K219 Gastro-esophageal reflux disease without esophagitis: Secondary | ICD-10-CM

## 2017-02-08 DIAGNOSIS — R079 Chest pain, unspecified: Secondary | ICD-10-CM

## 2017-02-08 LAB — BASIC METABOLIC PANEL
BUN: 9 mg/dL (ref 6–23)
CALCIUM: 10.1 mg/dL (ref 8.4–10.5)
CO2: 28 mEq/L (ref 19–32)
Chloride: 105 mEq/L (ref 96–112)
Creatinine, Ser: 0.63 mg/dL (ref 0.40–1.20)
GFR: 105.24 mL/min (ref >60.00)
GLUCOSE: 89 mg/dL (ref 70–99)
Potassium: 4.3 mEq/L (ref 3.5–5.1)
SODIUM: 139 meq/L (ref 135–145)

## 2017-02-08 NOTE — Telephone Encounter (Signed)
Pt was given middle finger xray results but needs the chest xray results. Please call back

## 2017-02-08 NOTE — Telephone Encounter (Signed)
Pt has been informed and expressed understanding. She will come by today for lab work. Pt would like to have CT scan done tomorrow if possible since she will leaving to go out of town Saturday.

## 2017-02-08 NOTE — Telephone Encounter (Signed)
Insurance wants to schedule with pt - spoke to pt to advise. Gave her Insurance scheduling number [phone]. She will call them

## 2017-02-08 NOTE — Telephone Encounter (Signed)
Pt returned your call through the night call center. Could you call her regarding these results when you have a chance? Thanks!

## 2017-02-09 ENCOUNTER — Telehealth: Payer: Self-pay

## 2017-02-09 ENCOUNTER — Ambulatory Visit (INDEPENDENT_AMBULATORY_CARE_PROVIDER_SITE_OTHER)
Admission: RE | Admit: 2017-02-09 | Discharge: 2017-02-09 | Disposition: A | Discharge status: Home / Self Care | Payer: Managed Care, Other (non HMO) | Source: Ambulatory Visit | Attending: Internal Medicine | Admitting: Internal Medicine

## 2017-02-09 DIAGNOSIS — R079 Chest pain, unspecified: Secondary | ICD-10-CM | POA: Diagnosis not present

## 2017-02-09 DIAGNOSIS — I77810 Thoracic aortic ectasia: Secondary | ICD-10-CM

## 2017-02-09 MED ORDER — IOPAMIDOL (ISOVUE-300) INJECTION 61%: 80.0000 mL | Freq: Once | INTRAVENOUS | Status: DC | PRN

## 2017-02-09 NOTE — Telephone Encounter (Signed)
-----   Message from Corwin LevinsJames W John, MD sent at 02/09/2017  4:40 PM EDT ----- Left message on MyChart, pt to cont same tx except  The test results show that your current treatment is OK, as there is no evidence for "spots" or anything that might suggest cancer.  There was incidentally found a mild Ascending Aortic Aneurysm, which often does not cause problems, but might need to be followed, such as a repeat CT scan every 1 - 2 years.  Please let me know if you would want to be referred to Cardiology who often follow this.Olivia Mendez.    Olivia Mendez to please inform pt, and let me know if patient would want the Cardiology referral

## 2017-02-09 NOTE — Telephone Encounter (Signed)
Pt has been informed and expressed understanding. She would like the referral to go to Dr. Freddie BreechJay Gongi  (cardiologist that her mother goes to).

## 2017-02-10 NOTE — Telephone Encounter (Signed)
Ok, [facility]

## 2017-02-12 NOTE — Addendum Note (Signed)
Addended by: Corwin LevinsJOHN, JAMES W on: 02/12/2017 02:48 PM   Modules accepted: Orders

## 2017-02-12 NOTE — Telephone Encounter (Signed)
Done, thanks

## 2017-02-12 NOTE — Telephone Encounter (Signed)
Can you please put a cardiology referral in for pt.  Thank you

## 2017-02-13 NOTE — Telephone Encounter (Signed)
Referral faxed to [facility] [phone]

## 2017-02-21 ENCOUNTER — Ambulatory Visit (INDEPENDENT_AMBULATORY_CARE_PROVIDER_SITE_OTHER): Payer: Managed Care, Other (non HMO) | Admitting: Orthopaedic Surgery

## 2017-02-21 ENCOUNTER — Encounter: Payer: Self-pay | Admitting: Internal Medicine

## 2017-02-21 DIAGNOSIS — M79645 Pain in left finger(s): Secondary | ICD-10-CM | POA: Insufficient documentation

## 2017-02-21 NOTE — Telephone Encounter (Signed)
Olivia Mendez   Can you please forward the information requested by the pt to Dr Jacinto Halim?

## 2017-02-21 NOTE — Progress Notes (Signed)
The patient is someone I'm seeing for the first time. She is referred from her primary care physician Dr. Jonny Ruiz to evaluate a left finger injury. She's never had bring up also for but about a month ago she hit her middle finger by mistake against a table visit a few times since then and noticed knots around the end of her finger she points to the DIP joint area of the left middle finger. She is right-hand-dominant. She says she woke one morning could not even flex her finger but now it's gotten significantly better. She said that Dr. Jonny Ruiz sent in some Voltaren gel but she did not pick it up she is doing better overall.  On exam she has normal function of her left hand. There is prominent osteophytes of the DIP joint on the left middle finger but her flexion-extension is full. There is no evidence of triggering and no evidence of mallet deformity. There is no instability of the joints on exam. She can make a full fist as well. She is neurovascular intact. There is no evidence of a mucous cyst.  I did independently review x-rays of her left finger and he can see prominent slight osteophytes but otherwise no acute findings. The radiologist points to potentially a small avulsion area but clinically this is not where she is having any issues.  I gave her reassurance that her finger should do well. I'll questions and concerns were answered and addressed. She'll follow-up as needed.

## 2017-03-18 DIAGNOSIS — I7121 Aneurysm of the ascending aorta, without rupture: Secondary | ICD-10-CM | POA: Diagnosis present

## 2017-03-18 DIAGNOSIS — I712 Thoracic aortic aneurysm, without rupture: Secondary | ICD-10-CM | POA: Diagnosis present

## 2017-03-18 NOTE — H&P (Signed)
OFFICE VISIT NOTES COPIED TO EPIC FOR DOCUMENTATION  . History of Present Illness Olivia Page MD; 03/09/2017 5:38 AM) Patient words: F/u CP, Murmur, Echo results; Last office visit 02/22/17; Pt c/o cp feeling like pressure, when she eats she has a feeling of food getting stuck.  The patient is a 51 year old female who presents for a Follow-up for Thoracic aortic aneurysm. Patient is referred to [place] after she recently underwent CT scan of the chest and was noted to have ascending thoracic aortic aneurysm measured at 4.4 cm on 02/09/2017. She had chest pain after she had eaten and chest x-ray performed and revealed mediastinal enlargement leading to chest CT scan.  She is now complaining of chest pain, mostly noted after eating but occasionally during rest. No exertional component. She is accompanied by her husband. No associated shortness of breath or pain radiation. Denies PND, orthopnea, palpitations, edema, syncopal episodes, or symptoms of claudication or TIA. Denies any smoking history.   Problem List/Past Medical Olivia Page, MD; 03/08/2017 11:06 AM) Hypothyroidism (acquired) (E03.9)  Laboratory examination (Z01.89)  02/08/2017: Potassium 4.3, creatinine 0.63, EGFR to 5, BMP normal. 01/08/2017: CMP normal. Ferritin elevated at 164, our studies otherwise normal. Cholesterol 187, triglycerides 86, HDL 63, LDL 107. TSH 1.13. T4 11.4. T3 29. Vitamin D low at 23. CBC normal. Ascending aortic aneurysm (I71.2)  CT scan chest 02/09/2017: Ascending thoracic aorta prominence of maximum measurer transverse diameter 4.44.4 cm. No thoracic aortic dissection. Recommend annual imaging. Mild atelectasis on the left lung. Hepatic steatosis. Aortic systolic murmur on examination (I35.8)  Echocardiogram 02/28/2017: Left ventricle cavity is normal in size. Normal global wall motion. Normal diastolic filling pattern. Calculated EF 57%. Poorly visualized. Mildly restricted aortic valve leaflets. Trace  aortic valve stenosis. No aortic valve regurgitation noted. Aortic valve peak pressure gradient of 20 and mean gradient of 10 mmHg, calculated aortic valve area 1.42 cm. Trace mitral regurgitation. Mild tricuspid regurgitation. No evidence of pulmonary hypertension. The aortic root is mildly dilated at 4.2 cm. Consider TEE if clinically indicated to better define aortic valve anatomy.  Allergies Frances Furbish Johnson; 03/08/2017 10:22 AM) Vicodin *ANALGESICS - OPIOID*  Nausea.  Family History Frances Furbish Johnson; 03/08/2017 10:22 AM) Mother  In stable health. PAD, chol, dm, no heart attacks or strokes, no known cardiovascular conditions Father  In good health. no heart attacks or strokes, no known cardiovascular conditions Brother 1  good health, no heart attacks or strokes, no known cardiovascular conditions  Social History Frances Furbish Johnson; 03/08/2017 10:22 AM) Current tobacco use  Never smoker. Alcohol Use  rarely Marital status  Married. Living Situation  Lives with spouse. Number of Children  0.  Past Surgical History Cheri Kearns; 03/08/2017 10:22 AM) Appendectomy [1993]: Gallbladder Surgery [1998]: Indometriosis  mid 1990's  Medication History Frances Furbish Johnson; 03/08/2017 10:28 AM) Liothyronine Sodium (5MCG Tablet, 1 Oral daily) Active. Synthroid (137MCG Tablet, 1 Oral daily) Active. Vitamin D3 (2000UNIT Tablet, 1 Oral daily) Active. C Complex (1068m Oral daily) Active. Omega-3 & Omega-6 Fish Oil (2 Oral daily) Active. B Complex 100 (1 Oral daily) Active. CoQ10 (200MG Capsule, 1 Oral daily) Active. Medications Reconciled (verbally with pt;)  Diagnostic Studies History (April Harrington; 03/08/2017 8:47 AM) Echocardiogram [02/28/2017]: Left ventricle cavity is normal in size. Normal global wall motion. Normal diastolic filling pattern. Calculated EF 57%. Poorly visualized. Mildly restricted aortic valve leaflets. Trace aortic valve stenosis. No aortic valve  regurgitation noted. Aortic valve peak pressure gradient of 20 and mean gradient of 10 mmHg, calculated  aortic valve area 1.42 cm. Trace mitral regurgitation. Mild tricuspid regurgitation. No evidence of pulmonary hypertension. The aortic root is mildly dilated at 4.2 cm. Consider TEE if clinically indicated to better define aortic valve anatomy    Review of Systems Olivia Page MD; 03/08/2017 11:17 AM) General Not Present- Appetite Loss and Weight Gain. Respiratory Not Present- Chronic Cough and Wakes up from Sleep Wheezing or Short of Breath. Cardiovascular Present- Chest Pain. Not Present- Difficulty Breathing Lying Down, Difficulty Breathing On Exertion and Edema. Gastrointestinal Present- Heartburn. Not Present- Black, Tarry Stool and Difficulty Swallowing. Musculoskeletal Not Present- Decreased Range of Motion and Muscle Atrophy. Neurological Not Present- Attention Deficit. Psychiatric Not Present- Personality Changes and Suicidal Ideation. Endocrine Not Present- Cold Intolerance and Heat Intolerance. Hematology Not Present- Abnormal Bleeding. All other systems negative  Vitals Frances Furbish Johnson; 03/08/2017 10:34 AM) 03/08/2017 10:24 AM Weight: 211.31 lb Height: 68in Body Surface Area: 2.09 m Body Mass Index: 32.13 kg/m  Pulse: 63 (Regular)  P.OX: 97% (Room air) BP: 123/77 (Sitting, Left Arm, Standard)       Physical Exam Olivia Page MD; 03/08/2017 11:17 AM) General Mental Status-Alert. General Appearance-Cooperative and Appears stated age. Build & Nutrition-Well built and Mildly obese.  Head and Neck Thyroid Gland Characteristics - normal size and consistency and no palpable nodules.  Chest and Lung Exam Chest and lung exam reveals -quiet, even and easy respiratory effort with no use of accessory muscles, non-tender and on auscultation, normal breath sounds, no adventitious sounds.  Cardiovascular Cardiovascular examination reveals  -carotid auscultation reveals no bruits, femoral artery auscultation bilaterally reveals normal pulses, no bruits, no thrills and normal pedal pulses bilaterally. Auscultation Heart Sounds - S1 WNL, S2 WNL and Ejection systolic click present. Murmurs & Other Heart Sounds: Murmur - Location - Aortic Area. Grade - II/VI. Character - Crescendo and Systolic Ejection.  Abdomen Inspection Contour - Obese. Palpation/Percussion Normal exam - Non Tender and No hepatosplenomegaly.  Peripheral Vascular Abdomen-Prominent abdominal aortic pulsation, No epigastric bruit.  Neurologic Neurologic evaluation reveals -alert and oriented x 3 with no impairment of recent or remote memory. Motor-Grossly intact without any focal deficits.  Musculoskeletal Global Assessment Left Lower Extremity - no deformities, masses or tenderness, no known fractures. Right Lower Extremity - no deformities, masses or tenderness, no known fractures.    Assessment & Plan Olivia Page MD; 03/09/2017 5:39 AM) Ascending aortic aneurysm (I71.2) Story: CT scan chest 02/09/2017: Ascending thoracic aorta prominence of maximum measurer transverse diameter 4.44.4 cm. No thoracic aortic dissection. Recommend annual imaging. Mild atelectasis on the left lung. Hepatic steatosis. Impression: EKG 02/22/2017: NSR @ 63/min, rightward axis. Borderline low voltage. No ischemia. Current Plans Started Losartan Potassium 100MG, 1 (one) Tablet daily, #90, 90 days starting 03/08/2017, Ref. x3. Future Plans 09/04/2017: Echocardiography, transthoracic, real-time with image documentation (2D), includes M-mode recording, when performed, complete, with spectral Doppler echocardiography, and with color flow Doppler echocardiography (53664) - one time Atypical chest pain (R07.89) Story: EKG 03/08/2017: Normal sinus rhythm at rate of 59 bpm, normal axis. No evidence of ischemia, normal EKG. Aortic systolic murmur on examination (I35.8) Story:  Echocardiogram 02/28/2017: Left ventricle cavity is normal in size. Normal global wall motion. Normal diastolic filling pattern. Calculated EF 57%. Poorly visualized. Mildly restricted aortic valve leaflets. Trace aortic valve stenosis. No aortic valve regurgitation noted. Aortic valve peak pressure gradient of 20 and mean gradient of 10 mmHg, calculated aortic valve area 1.42 cm. Trace mitral regurgitation. Mild tricuspid regurgitation. No evidence of pulmonary hypertension.  The aortic root is mildly dilated at 4.2 cm. Consider TEE if clinically indicated to better define aortic valve anatomy. GERD without esophagitis (K21.9) Laboratory examination (P59.45) Story: 02/08/2017: Potassium 4.3, creatinine 0.63, EGFR to 5, BMP normal.  01/08/2017: CMP normal. Ferritin elevated at 164, our studies otherwise normal. Cholesterol 187, triglycerides 86, HDL 63, LDL 107. TSH 1.13. T4 11.4. T3 29. Vitamin D low at 23. CBC normal.  Note:. Recommendation:  She is here for follow-up of echocardiogram and follow-up of a single aortic aneurysm, last seen about 2-3 weeks ago. I reviewed the results of the echocardiogram the patient, reviewed the images and the aortic valve is not well visualized. Option should be either to continue observation and start the patient on losartan due to presence of ascending aortic aneurysm and if aneurysm gets to be 5 cm, the could certainly do TEE versus proceeding with TEE to confirm if she does indeed have bicuspid aortic. Patient wishes to proceed with TEE.  Chest pain symptoms seem very atypical and mostly related to eating, I suspect she has significant GERD. I advised her to use over-the-counter PPI. I will also set her up for a routine treadmill exercise stress test.  I will see her back in 6 months, we'll repeat transthoracic echocardiogram prior to her office visit. Plan to perform CTA of thorax and abdomen in 1 year for f/u to exclude abdominal aortic aneurysm as  well. There appears to be a good correlation between CT scan of the chest which revealed 4.6 cm ascending aortic aneurysm versus transthoracic echo showing measurement of 4.2-4.6 cm. All questions answered. Husband present at the bedside. We will repeat BMP today 3 weeks following initiation of losartan at 100 mg daily, she will slowly titrated up.  CC: Cathlean Cower, MD    Signed by Olivia Page, MD (03/09/2017 5:40 AM)

## 2017-03-19 ENCOUNTER — Other Ambulatory Visit: Payer: Self-pay | Admitting: Women's Health

## 2017-03-19 DIAGNOSIS — Z1231 Encounter for screening mammogram for malignant neoplasm of breast: Secondary | ICD-10-CM

## 2017-03-22 ENCOUNTER — Ambulatory Visit (HOSPITAL_COMMUNITY)
Admission: RE | Admit: 2017-03-22 | Discharge: 2017-03-22 | Disposition: A | Discharge status: Home / Self Care | Payer: Managed Care, Other (non HMO) | Source: Ambulatory Visit | Attending: Cardiology | Admitting: Cardiology

## 2017-03-22 ENCOUNTER — Encounter (HOSPITAL_COMMUNITY): Payer: Self-pay | Admitting: *Deleted

## 2017-03-22 ENCOUNTER — Encounter (HOSPITAL_COMMUNITY)
Admission: RE | Disposition: A | Discharge status: Home / Self Care | Payer: Self-pay | Source: Ambulatory Visit | Attending: Cardiology

## 2017-03-22 DIAGNOSIS — Z885 Allergy status to narcotic agent status: Secondary | ICD-10-CM | POA: Diagnosis not present

## 2017-03-22 DIAGNOSIS — I071 Rheumatic tricuspid insufficiency: Secondary | ICD-10-CM | POA: Diagnosis not present

## 2017-03-22 DIAGNOSIS — E039 Hypothyroidism, unspecified: Secondary | ICD-10-CM | POA: Diagnosis not present

## 2017-03-22 DIAGNOSIS — I714 Abdominal aortic aneurysm, without rupture: Secondary | ICD-10-CM | POA: Diagnosis not present

## 2017-03-22 DIAGNOSIS — Q231 Congenital insufficiency of aortic valve: Secondary | ICD-10-CM | POA: Diagnosis not present

## 2017-03-22 DIAGNOSIS — K219 Gastro-esophageal reflux disease without esophagitis: Secondary | ICD-10-CM | POA: Diagnosis not present

## 2017-03-22 DIAGNOSIS — I7121 Aneurysm of the ascending aorta, without rupture: Secondary | ICD-10-CM | POA: Diagnosis present

## 2017-03-22 DIAGNOSIS — Z79899 Other long term (current) drug therapy: Secondary | ICD-10-CM | POA: Insufficient documentation

## 2017-03-22 DIAGNOSIS — R011 Cardiac murmur, unspecified: Secondary | ICD-10-CM | POA: Diagnosis not present

## 2017-03-22 DIAGNOSIS — K760 Fatty (change of) liver, not elsewhere classified: Secondary | ICD-10-CM | POA: Diagnosis not present

## 2017-03-22 DIAGNOSIS — J9811 Atelectasis: Secondary | ICD-10-CM | POA: Diagnosis not present

## 2017-03-22 DIAGNOSIS — I712 Thoracic aortic aneurysm, without rupture: Secondary | ICD-10-CM | POA: Diagnosis present

## 2017-03-22 HISTORY — PX: TEE WITHOUT CARDIOVERSION: SHX5443

## 2017-03-22 SURGERY — ECHOCARDIOGRAM, TRANSESOPHAGEAL
Anesthesia: Moderate Sedation

## 2017-03-22 MED ORDER — BUTAMBEN-TETRACAINE-BENZOCAINE 2-2-14 % EX AERO
INHALATION_SPRAY | CUTANEOUS | Status: DC | PRN
  Administered 2017-03-22: 2 via TOPICAL

## 2017-03-22 MED ORDER — DIPHENHYDRAMINE HCL 50 MG/ML IJ SOLN
INTRAMUSCULAR | Status: AC
  Filled 2017-03-22: qty 1

## 2017-03-22 MED ORDER — MIDAZOLAM HCL 10 MG/2ML IJ SOLN
INTRAMUSCULAR | Status: DC | PRN
  Administered 2017-03-22: 1 mg via INTRAVENOUS
  Administered 2017-03-22: 2 mg via INTRAVENOUS

## 2017-03-22 MED ORDER — SODIUM CHLORIDE 0.9 % IV SOLN: INTRAVENOUS | Status: DC

## 2017-03-22 MED ORDER — FENTANYL CITRATE (PF) 100 MCG/2ML IJ SOLN
INTRAMUSCULAR | Status: DC | PRN
  Administered 2017-03-22: 50 ug via INTRAVENOUS

## 2017-03-22 MED ORDER — FENTANYL CITRATE (PF) 100 MCG/2ML IJ SOLN
INTRAMUSCULAR | Status: AC
  Filled 2017-03-22: qty 2

## 2017-03-22 MED ORDER — MIDAZOLAM HCL 5 MG/ML IJ SOLN
INTRAMUSCULAR | Status: AC
Start: 2017-03-22 — End: 2017-03-22
  Filled 2017-03-22: qty 2

## 2017-03-22 NOTE — Progress Notes (Signed)
  Echocardiogram Echocardiogram Transesophageal has been performed.  Delcie RochENNINGTON, Skylie Hiott 03/22/2017, 9:07 AM

## 2017-03-22 NOTE — Discharge Instructions (Signed)

## 2017-03-22 NOTE — Interval H&P Note (Signed)
History and Physical Interval Note:  03/22/2017 8:21 AM  Olivia Mendez  has presented today for surgery, with the diagnosis of bicuspid aortic valve and murmur  The various methods of treatment have been discussed with the patient and family. After consideration of risks, benefits and other options for treatment, the patient has consented to  Procedure(s): TRANSESOPHAGEAL ECHOCARDIOGRAM (TEE) (N/A) as a surgical intervention .  The patient's history has been reviewed, patient examined, no change in status, stable for surgery.  I have reviewed the patient's chart and labs.  Questions were answered to the patient's satisfaction.     Yates DecampJay Dannielle Baskins

## 2017-03-22 NOTE — CV Procedure (Signed)
Indication aortic aneurysm, bicuspid aortic valve TEE: Under moderate sedation, TEE was performed without complications: LV: Normal. Normal EF. RV: Normal LA: Normal. Left atrial appendage: Normal without thrombus. Normal function. Inter atrial septum is intact without defect. Double contrast study negative for atrial level shunting. No late appearance of bubbles either. RA: Normal  MV: Normal Trace MR. TV: Normal Trace TR AV: Bicuspid aortic valve. No AI or AS. PV: Normal. Trace PI.  Thoracic and ascending aorta: Mildly dilated at 40 mm. Normal descending aorta without significant plaque or atheromatous changes.  Conscious sedation protocol was followed, I personally administered conscious sedation and monitored the patient. Patient received 3 milligrams of Versed and 50 mcg fentanyl . Patient tolerated the procedure well and there was no complication from conscious sedation. Time administered was 13 minutes.

## 2017-03-23 ENCOUNTER — Encounter (HOSPITAL_COMMUNITY): Payer: Self-pay | Admitting: Cardiology

## 2017-03-27 ENCOUNTER — Encounter: Payer: Self-pay | Admitting: Internal Medicine

## 2017-03-27 DIAGNOSIS — K219 Gastro-esophageal reflux disease without esophagitis: Secondary | ICD-10-CM

## 2017-03-30 ENCOUNTER — Encounter: Payer: Self-pay | Admitting: Gastroenterology

## 2017-03-30 NOTE — Telephone Encounter (Signed)
If you could refer me to Dr. Willis ModenaWilliam Outlaw, I'd appreciate it  PCC's to see above please

## 2017-04-03 ENCOUNTER — Ambulatory Visit
Admission: RE | Admit: 2017-04-03 | Discharge: 2017-04-03 | Disposition: A | Discharge status: Home / Self Care | Payer: Managed Care, Other (non HMO) | Source: Ambulatory Visit | Attending: Women's Health | Admitting: Women's Health

## 2017-04-03 ENCOUNTER — Encounter: Payer: Self-pay | Admitting: Women's Health

## 2017-04-03 ENCOUNTER — Other Ambulatory Visit: Payer: Self-pay | Admitting: Gastroenterology

## 2017-04-03 DIAGNOSIS — Z1231 Encounter for screening mammogram for malignant neoplasm of breast: Secondary | ICD-10-CM

## 2017-04-03 DIAGNOSIS — R1310 Dysphagia, unspecified: Secondary | ICD-10-CM

## 2017-04-10 ENCOUNTER — Ambulatory Visit
Admission: RE | Admit: 2017-04-10 | Discharge: 2017-04-10 | Disposition: A | Discharge status: Home / Self Care | Payer: Managed Care, Other (non HMO) | Source: Ambulatory Visit | Attending: Gastroenterology | Admitting: Gastroenterology

## 2017-04-10 DIAGNOSIS — R1310 Dysphagia, unspecified: Secondary | ICD-10-CM

## 2017-05-22 ENCOUNTER — Ambulatory Visit: Payer: Managed Care, Other (non HMO) | Admitting: Gastroenterology

## 2017-08-14 ENCOUNTER — Encounter: Payer: Self-pay | Admitting: Internal Medicine

## 2017-09-04 ENCOUNTER — Encounter: Payer: Self-pay | Admitting: Internal Medicine

## 2017-11-02 ENCOUNTER — Encounter: Payer: Self-pay | Admitting: Internal Medicine

## 2017-11-02 DIAGNOSIS — E039 Hypothyroidism, unspecified: Secondary | ICD-10-CM

## 2017-11-02 MED ORDER — LEVOTHYROXINE SODIUM 125 MCG PO TABS: 125.0000 ug | ORAL_TABLET | Freq: Every day | ORAL | 1 refills | Status: DC

## 2017-11-02 NOTE — Addendum Note (Signed)
Addended by: Corwin Levins on: 11/02/2017 12:46 PM   Modules accepted: Orders

## 2017-11-18 ENCOUNTER — Encounter: Payer: Self-pay | Admitting: Internal Medicine

## 2017-11-19 ENCOUNTER — Encounter: Payer: Self-pay | Admitting: Internal Medicine

## 2017-11-19 ENCOUNTER — Ambulatory Visit (INDEPENDENT_AMBULATORY_CARE_PROVIDER_SITE_OTHER): Payer: Managed Care, Other (non HMO) | Admitting: Internal Medicine

## 2017-11-19 VITALS — BP 122/78 | HR 82 | Temp 98.1°F | Ht 68.0 in | Wt 207.0 lb

## 2017-11-19 DIAGNOSIS — R05 Cough: Secondary | ICD-10-CM

## 2017-11-19 DIAGNOSIS — R062 Wheezing: Secondary | ICD-10-CM | POA: Diagnosis not present

## 2017-11-19 DIAGNOSIS — F411 Generalized anxiety disorder: Secondary | ICD-10-CM

## 2017-11-19 DIAGNOSIS — I712 Thoracic aortic aneurysm, without rupture: Secondary | ICD-10-CM

## 2017-11-19 DIAGNOSIS — R059 Cough, unspecified: Secondary | ICD-10-CM

## 2017-11-19 DIAGNOSIS — I7121 Aneurysm of the ascending aorta, without rupture: Secondary | ICD-10-CM

## 2017-11-19 DIAGNOSIS — Z0000 Encounter for general adult medical examination without abnormal findings: Secondary | ICD-10-CM | POA: Diagnosis not present

## 2017-11-19 MED ORDER — LEVOFLOXACIN 500 MG PO TABS: 500.0000 mg | ORAL_TABLET | Freq: Every day | ORAL | 0 refills | Status: AC

## 2017-11-19 MED ORDER — GUAIFENESIN-CODEINE 100-10 MG/5ML PO SYRP: 5.0000 mL | ORAL_SOLUTION | Freq: Three times a day (TID) | ORAL | 0 refills | Status: DC | PRN

## 2017-11-19 MED ORDER — PREDNISONE 10 MG PO TABS: ORAL_TABLET | ORAL | 0 refills | Status: DC

## 2017-11-19 NOTE — Assessment & Plan Note (Signed)
Will plan on ordering CTA or MRA after lab eval with next visit

## 2017-11-19 NOTE — Assessment & Plan Note (Signed)
Mild to mod, c/w bronchitis vs pna, declines cxr, for antibx course,  Cough med prn, to f/u any worsening symptoms or concerns 

## 2017-11-19 NOTE — Patient Instructions (Signed)
Please take all new medication as prescribed - the antibiotic, cough medicine if needed, and prednisone  Please continue all other medications as before, and refills have been done if requested.  Please have the pharmacy call with any other refills you may need.  Please keep your appointments with your specialists as you may have planned  Please return in 3 months, or sooner if needed, with Lab testing done 3-5 days before

## 2017-11-19 NOTE — Assessment & Plan Note (Signed)
Mild, for predpac asd,  to f/u any worsening symptoms or concerns 

## 2017-11-19 NOTE — Progress Notes (Signed)
Subjective:    Patient ID: Olivia Mendez, female    DOB: May 04, 1966, 52 y.o.   MRN: [id number]  HPI Here with acute onset mild to mod 2-3 days ST, HA, general weakness and malaise, with prod cough greenish sputum, but Pt denies chest pain, increased sob or doe, wheezing, orthopnea, PND, increased LE swelling, palpitations, dizziness or syncope, except for some mild wheeziness and sob since last PM.  Denies worsening depressive symptoms, suicidal ideation, or panic; Has hx of thoracic aorta enlargement, will need f/u CTA or MRA in sept 2019.    Denies hyper or hypo thyroid symptoms such as voice, skin or hair change. Past Medical History:  Diagnosis Date  . Allergic rhinitis 12/26/2014  . Endometriosis 1990's  . Hypothyroid   . Migraines    Past Surgical History:  Procedure Laterality Date  . APPENDECTOMY    . CHOLECYSTECTOMY    . OTHER SURGICAL HISTORY     surgery for Endometriosis  . TEE WITHOUT CARDIOVERSION N/A 03/22/2017   Procedure: TRANSESOPHAGEAL ECHOCARDIOGRAM (TEE);  Surgeon: Yates Decamp, MD;  Location: [facility] ENDOSCOPY;  Service: Cardiovascular;  Laterality: N/A;    reports that she has never smoked. She has never used smokeless tobacco. She reports that she drinks alcohol. She reports that she does not use drugs. family history includes Cancer in her paternal grandmother; Diabetes in her mother; Healthy in her father; Heart disease in her maternal grandfather and paternal grandfather; Multiple sclerosis in her maternal grandmother. Allergies  Allergen Reactions  . Vicodin [Hydrocodone-Acetaminophen]    Current Outpatient Medications on File Prior to Visit  Medication Sig Dispense Refill  . b complex vitamins tablet Take 1 tablet by mouth daily.    [name] Bioflavonoid Products (C-COMPLEX PO) Take 1,000 mg by mouth.    . Cholecalciferol (VITAMIN D-3 PO) Take 2,000 Units by mouth.    . co-enzyme Q-10 30 MG capsule Take 200 mg by mouth 3 (three) times daily.    . diclofenac sodium  (VOLTAREN) 1 % GEL Apply 4 g topically 4 (four) times daily as needed. 400 g 1  . levothyroxine (SYNTHROID, LEVOTHROID) 125 MCG tablet Take 1 tablet (125 mcg total) by mouth daily. 90 tablet 1  . liothyronine (CYTOMEL) 5 MCG tablet Take 5 mcg by mouth daily.    [name] losartan (COZAAR) 50 MG tablet Take 50 mg by mouth daily.    . Omega-3 Fatty Acids (FISH OIL) 1360 MG CAPS Take 1,600 mg by mouth.     No current facility-administered medications on file prior to visit.    Review of Systems  Constitutional: Negative for other unusual diaphoresis or sweats HENT: Negative for ear discharge or swelling Eyes: Negative for other worsening visual disturbances Respiratory: Negative for stridor or other swelling  Gastrointestinal: Negative for worsening distension or other blood Genitourinary: Negative for retention or other urinary change Musculoskeletal: Negative for other MSK pain or swelling Skin: Negative for color change or other new lesions Neurological: Negative for worsening tremors and other numbness  Psychiatric/Behavioral: Negative for worsening agitation or other fatigue All other system neg per pt    Objective:   Physical Exam BP 122/78   Pulse 82   Temp 98.1 F (36.7 C) (Oral)   Ht 5\' 8"  (1.727 m)   Wt 207 lb (93.9 kg)   LMP 10/03/2008   SpO2 97%   BMI 31.47 kg/m  VS noted, mild ill Constitutional: Pt appears in NAD HENT: Head: NCAT.  Right Ear: External ear normal.  Left  Ear: External ear normal.  Eyes: . Pupils are equal, round, and reactive to light. Conjunctivae and EOM are normal Nose: without d/c or deformity Bilat tm's with mild erythema.  Max sinus areas non tender.  Pharynx with mild erythema, no exudate Neck: Neck supple. Gross normal ROM Cardiovascular: Normal rate and regular rhythm.   Pulmonary/Chest: Effort normal and breath sounds decreased without rales but with few soft insp wheezing.  Abd:  Soft, NT, ND, + BS, no organomegaly Neurological: Pt is alert.  At baseline orientation, motor grossly intact Skin: Skin is warm. No rashes, other new lesions, no LE edema Psychiatric: Pt behavior is normal without agitation , mild nervous No other exam findings Lab Results  Component Value Date   WBC 6.9 11/13/2014   HGB 14.1 11/13/2014   HCT 41.4 11/13/2014   PLT 314.0 11/13/2014   GLUCOSE 89 02/08/2017   CHOL 180 07/24/2012   TRIG 68 07/24/2012   HDL 62 07/24/2012   LDLCALC 104 (H) 07/24/2012   ALT 46 (H) 11/13/2014   AST 29 11/13/2014   NA 139 02/08/2017   K 4.3 02/08/2017   CL 105 02/08/2017   CREATININE 0.63 02/08/2017   BUN 9 02/08/2017   CO2 28 02/08/2017   TSH 2.12 11/13/2014   INR 0.9 ratio 09/14/2008   HGBA1C 5.5 11/13/2014      Assessment & Plan:

## 2017-11-19 NOTE — Assessment & Plan Note (Signed)
Mild situational, overall stable by history and exam, recent data reviewed with pt, and pt to continue medical treatment as before,  to f/u any worsening symptoms or concerns

## 2017-12-10 ENCOUNTER — Encounter: Payer: Self-pay | Admitting: Women's Health

## 2017-12-10 ENCOUNTER — Ambulatory Visit (INDEPENDENT_AMBULATORY_CARE_PROVIDER_SITE_OTHER): Payer: Managed Care, Other (non HMO) | Admitting: Women's Health

## 2017-12-10 VITALS — BP 126/80 | Ht 68.0 in | Wt 209.0 lb

## 2017-12-10 DIAGNOSIS — Z01419 Encounter for gynecological examination (general) (routine) without abnormal findings: Secondary | ICD-10-CM | POA: Diagnosis not present

## 2017-12-10 NOTE — Progress Notes (Signed)
Olivia Mendez 02/15/1966 [id number]    History:    Presents for annual exam.  Menopausal on no HRT with no bleeding.  Normal Pap and mammogram history.  Had an endoscopy this past year is on Protonix and doing better.  Also diagnosed this past year with a ascending aortic aneurysm Dr. Jacinto HalimGanji managing.  Primary care manages hypothyroidism, hypertension.  2010 no polyps on colonoscopy, diverticulosis.  Past medical history, past surgical history, family history and social history were all reviewed and documented in the EPIC chart.  Works as a [employer].  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  Mother diabetes and arthritis.  ROS:  A ROS was performed and pertinent positives and negatives are included.  Exam:  Vitals:   12/10/17 1621  BP: 126/80  Weight: 209 lb (94.8 kg)  Height: 5\' 8"  (1.727 m)   Body mass index is 31.78 kg/m.   General appearance:  Normal Thyroid:  Symmetrical, normal in size, without palpable masses or nodularity. Respiratory  Auscultation:  Clear without wheezing or rhonchi Cardiovascular  Auscultation:  Regular rate, without rubs, murmurs or gallops  Edema/varicosities: Numerous superficial varicosities lowerr legs Abdominal  Soft,nontender, without masses, guarding or rebound.  Liver/spleen:  No organomegaly noted  Hernia:  None appreciated  Skin  Inspection:  Grossly normal   Breasts: Examined lying and sitting.     Right: Without masses, retractions, discharge or axillary adenopathy.     Left: Without masses, retractions, discharge or axillary adenopathy. Gentitourinary   Inguinal/mons:  Normal without inguinal adenopathy  External genitalia:  Normal  BUS/Urethra/Skene's glands:  Normal  Vagina:  Normal  Cervix:  Normal  Uterus:   normal in size, shape and contour.  Midline and mobile  Adnexa/parametria:     Rt: Without masses or tenderness.   Lt: Without masses or tenderness.  Anus and perineum: Normal  Digital rectal exam: Normal sphincter tone  without palpated masses or tenderness  Assessment/Plan:  52 y.o. MWF G0 for annual exam with no GYN complaints.  Postmenopausal/no HRT/no bleeding Hypothyroidism, hypertension, primary care managing labs and meds GERD on Protonix doing better per GI 2019 ascending aortic aneurysm Dr. Jacinto HalimGanji managing Obesity  Plan: SBEs, continue annual screening mammogram, calcium rich foods, vitamin D 1000 daily encouraged.  Reviewed importance of decreasing carbohydrates, increasing cardio type exercise for weight loss encouraged.  Normal 2018, new screening guidelines reviewed.   Harrington Challengerancy J Mikell Kazlauskas [facility] Psych[facility] HospitalWHNP, 5:05 PM 12/10/2017

## 2017-12-10 NOTE — Patient Instructions (Signed)
Health Maintenance for Postmenopausal Women Menopause is a normal process in which your reproductive ability comes to an end. This process happens gradually over a span of months to years, usually between the ages of 22 and 9. Menopause is complete when you have missed 12 consecutive menstrual periods. It is important to talk with your health care provider about some of the most common conditions that affect postmenopausal women, such as heart disease, cancer, and bone loss (osteoporosis). Adopting a healthy lifestyle and getting preventive care can help to promote your health and wellness. Those actions can also lower your chances of developing some of these common conditions. What should I know about menopause? During menopause, you may experience a number of symptoms, such as:  Moderate-to-severe hot flashes.  Night sweats.  Decrease in sex drive.  Mood swings.  Headaches.  Tiredness.  Irritability.  Memory problems.  Insomnia.  Choosing to treat or not to treat menopausal changes is an individual decision that you make with your health care provider. What should I know about hormone replacement therapy and supplements? Hormone therapy products are effective for treating symptoms that are associated with menopause, such as hot flashes and night sweats. Hormone replacement carries certain risks, especially as you become older. If you are thinking about using estrogen or estrogen with progestin treatments, discuss the benefits and risks with your health care provider. What should I know about heart disease and stroke? Heart disease, heart attack, and stroke become more likely as you age. This may be due, in part, to the hormonal changes that your body experiences during menopause. These can affect how your body processes dietary fats, triglycerides, and cholesterol. Heart attack and stroke are both medical emergencies. There are many things that you can do to help prevent heart disease  and stroke:  Have your blood pressure checked at least every 1-2 years. High blood pressure causes heart disease and increases the risk of stroke.  If you are 53-22 years old, ask your health care provider if you should take aspirin to prevent a heart attack or a stroke.  Do not use any tobacco products, including cigarettes, chewing tobacco, or electronic cigarettes. If you need help quitting, ask your health care provider.  It is important to eat a healthy diet and maintain a healthy weight. ? Be sure to include plenty of vegetables, fruits, low-fat dairy products, and lean protein. ? Avoid eating foods that are high in solid fats, added sugars, or salt (sodium).  Get regular exercise. This is one of the most important things that you can do for your health. ? Try to exercise for at least 150 minutes each week. The type of exercise that you do should increase your heart rate and make you sweat. This is known as moderate-intensity exercise. ? Try to do strengthening exercises at least twice each week. Do these in addition to the moderate-intensity exercise.  Know your numbers.Ask your health care provider to check your cholesterol and your blood glucose. Continue to have your blood tested as directed by your health care provider.  What should I know about cancer screening? There are several types of cancer. Take the following steps to reduce your risk and to catch any cancer development as early as possible. Breast Cancer  Practice breast self-awareness. ? This means understanding how your breasts normally appear and feel. ? It also means doing regular breast self-exams. Let your health care provider know about any changes, no matter how small.  If you are 40  or older, have a clinician do a breast exam (clinical breast exam or CBE) every year. Depending on your age, family history, and medical history, it may be recommended that you also have a yearly breast X-ray (mammogram).  If you  have a family history of breast cancer, talk with your health care provider about genetic screening.  If you are at high risk for breast cancer, talk with your health care provider about having an MRI and a mammogram every year.  Breast cancer (BRCA) gene test is recommended for women who have family members with BRCA-related cancers. Results of the assessment will determine the need for genetic counseling and BRCA1 and for BRCA2 testing. BRCA-related cancers include these types: ? Breast. This occurs in males or females. ? Ovarian. ? Tubal. This may also be called fallopian tube cancer. ? Cancer of the abdominal or pelvic lining (peritoneal cancer). ? Prostate. ? Pancreatic.  Cervical, Uterine, and Ovarian Cancer Your health care provider may recommend that you be screened regularly for cancer of the pelvic organs. These include your ovaries, uterus, and vagina. This screening involves a pelvic exam, which includes checking for microscopic changes to the surface of your cervix (Pap test).  For women ages 21-65, health care providers may recommend a pelvic exam and a Pap test every three years. For women ages 79-65, they may recommend the Pap test and pelvic exam, combined with testing for human papilloma virus (HPV), every five years. Some types of HPV increase your risk of cervical cancer. Testing for HPV may also be done on women of any age who have unclear Pap test results.  Other health care providers may not recommend any screening for nonpregnant women who are considered low risk for pelvic cancer and have no symptoms. Ask your health care provider if a screening pelvic exam is right for you.  If you have had past treatment for cervical cancer or a condition that could lead to cancer, you need Pap tests and screening for cancer for at least 20 years after your treatment. If Pap tests have been discontinued for you, your risk factors (such as having a new sexual partner) need to be  reassessed to determine if you should start having screenings again. Some women have medical problems that increase the chance of getting cervical cancer. In these cases, your health care provider may recommend that you have screening and Pap tests more often.  If you have a family history of uterine cancer or ovarian cancer, talk with your health care provider about genetic screening.  If you have vaginal bleeding after reaching menopause, tell your health care provider.  There are currently no reliable tests available to screen for ovarian cancer.  Lung Cancer Lung cancer screening is recommended for adults 69-62 years old who are at high risk for lung cancer because of a history of smoking. A yearly low-dose CT scan of the lungs is recommended if you:  Currently smoke.  Have a history of at least 30 pack-years of smoking and you currently smoke or have quit within the past 15 years. A pack-year is smoking an average of one pack of cigarettes per day for one year.  Yearly screening should:  Continue until it has been 15 years since you quit.  Stop if you develop a health problem that would prevent you from having lung cancer treatment.  Colorectal Cancer  This type of cancer can be detected and can often be prevented.  Routine colorectal cancer screening usually begins at  age 42 and continues through age 45.  If you have risk factors for colon cancer, your health care provider may recommend that you be screened at an earlier age.  If you have a family history of colorectal cancer, talk with your health care provider about genetic screening.  Your health care provider may also recommend using home test kits to check for hidden blood in your stool.  A small camera at the end of a tube can be used to examine your colon directly (sigmoidoscopy or colonoscopy). This is done to check for the earliest forms of colorectal cancer.  Direct examination of the colon should be repeated every  5-10 years until age 71. However, if early forms of precancerous polyps or small growths are found or if you have a family history or genetic risk for colorectal cancer, you may need to be screened more often.  Skin Cancer  Check your skin from head to toe regularly.  Monitor any moles. Be sure to tell your health care provider: ? About any new moles or changes in moles, especially if there is a change in a mole's shape or color. ? If you have a mole that is larger than the size of a pencil eraser.  If any of your family members has a history of skin cancer, especially at a Holdyn Poyser age, talk with your health care provider about genetic screening.  Always use sunscreen. Apply sunscreen liberally and repeatedly throughout the day.  Whenever you are outside, protect yourself by wearing long sleeves, pants, a wide-brimmed hat, and sunglasses.  What should I know about osteoporosis? Osteoporosis is a condition in which bone destruction happens more quickly than new bone creation. After menopause, you may be at an increased risk for osteoporosis. To help prevent osteoporosis or the bone fractures that can happen because of osteoporosis, the following is recommended:  If you are 46-71 years old, get at least 1,000 mg of calcium and at least 600 mg of vitamin D per day.  If you are older than age 55 but younger than age 65, get at least 1,200 mg of calcium and at least 600 mg of vitamin D per day.  If you are older than age 54, get at least 1,200 mg of calcium and at least 800 mg of vitamin D per day.  Smoking and excessive alcohol intake increase the risk of osteoporosis. Eat foods that are rich in calcium and vitamin D, and do weight-bearing exercises several times each week as directed by your health care provider. What should I know about how menopause affects my mental health? Depression may occur at any age, but it is more common as you become older. Common symptoms of depression  include:  Low or sad mood.  Changes in sleep patterns.  Changes in appetite or eating patterns.  Feeling an overall lack of motivation or enjoyment of activities that you previously enjoyed.  Frequent crying spells.  Talk with your health care provider if you think that you are experiencing depression. What should I know about immunizations? It is important that you get and maintain your immunizations. These include:  Tetanus, diphtheria, and pertussis (Tdap) booster vaccine.  Influenza every year before the flu season begins.  Pneumonia vaccine.  Shingles vaccine.  Your health care provider may also recommend other immunizations. This information is not intended to replace advice given to you by your health care provider. Make sure you discuss any questions you have with your health care provider. Document Released: 07/14/2005  Document Revised: 12/10/2015 Document Reviewed: 02/23/2015 [employer] Interactive Patient Education  2018 [employer]  

## 2017-12-11 LAB — URINALYSIS, COMPLETE W/RFL CULTURE
BILIRUBIN URINE: NEGATIVE
Bacteria, UA: NONE SEEN /HPF
GLUCOSE, UA: NEGATIVE
Hgb urine dipstick: NEGATIVE
Hyaline Cast: NONE SEEN /LPF
KETONES UR: NEGATIVE
LEUKOCYTE ESTERASE: NEGATIVE
NITRITES URINE, INITIAL: NEGATIVE
PH: 6 (ref 5.0–8.0)
Protein, ur: NEGATIVE
RBC / HPF: NONE SEEN /HPF (ref 0–2)
SPECIFIC GRAVITY, URINE: 1.013 (ref 1.001–1.03)
Squamous Epithelial / LPF: NONE SEEN /HPF (ref <=5)
WBC, UA: NONE SEEN /HPF (ref 0–5)

## 2017-12-11 LAB — NO CULTURE INDICATED

## 2018-02-01 ENCOUNTER — Ambulatory Visit (INDEPENDENT_AMBULATORY_CARE_PROVIDER_SITE_OTHER): Payer: Managed Care, Other (non HMO) | Admitting: Endocrinology

## 2018-02-01 ENCOUNTER — Encounter: Payer: Self-pay | Admitting: Endocrinology

## 2018-02-01 VITALS — BP 122/80 | HR 68 | Ht 68.0 in | Wt 207.0 lb

## 2018-02-01 DIAGNOSIS — E063 Autoimmune thyroiditis: Secondary | ICD-10-CM

## 2018-02-01 DIAGNOSIS — R5383 Other fatigue: Secondary | ICD-10-CM | POA: Diagnosis not present

## 2018-02-01 LAB — TSH: TSH: 3.72 u[IU]/mL (ref 0.35–4.50)

## 2018-02-01 LAB — T4, FREE: Free T4: 0.98 ng/dL (ref 0.60–1.60)

## 2018-02-01 LAB — T3, FREE: T3 FREE: 3.4 pg/mL (ref 2.3–4.2)

## 2018-02-01 NOTE — Progress Notes (Signed)
Patient ID: Olivia Mendez, female   DOB: 01/30/1966, 52 y.o.   MRN: [id number]           Referring Physician: Oliver BarreJames John  Reason for Appointment:  Hypothyroidism, new visit    History of Present Illness:   Hypothyroidism was first diagnosed in 1994  At the time of diagnosis patient was having symptoms of fatigue and hair loss.  Also had noticed a swelling in her neck and was found to have a goiter She does not think she had symptoms of, cold sensitivity, difficulty concentrating, dry skin, weight gain  She has been treated by various physicians over the last several years About 5 years or so ago she thinks she was continuing to have fatigue  and when seen by a new endocrinologist she was suggested trying levothyroxine and liothyronine separately However she is not clear whether she started feeling better with this, she may have felt a little less tired        The patient has been treated with either levothyroxine or a combination of levothyroxine and Cytomel Her dose was reduced to 125 mcg levothyroxine instead of 150 last year but she has not had a follow-up since then    With starting thyroid supplementation the patient's symptoms apparently did not improve  Currently also she continues to feels fatigue but also thinks that this may be related to her busy lifestyle and multiple responsibilities She has had chronic cold intolerance and this is not any worse No recent weight gain or hair loss         Patient's weight history is as follows:  Wt Readings from Last 3 Encounters:  02/01/18 207 lb (93.9 kg)  12/10/17 209 lb (94.8 kg)  11/19/17 207 lb (93.9 kg)    Thyroid function results have been as follows:  No recent labs available from previous endocrinologist  Lab Results  Component Value Date   TSH 2.12 11/13/2014   TSH 1.14 09/14/2008     Past Medical History:  Diagnosis Date  . Allergic rhinitis 12/26/2014  . Aortic aneurysm (HCC) 2018  . Endometriosis 1990's  .  Hypothyroid   . Migraines     Past Surgical History:  Procedure Laterality Date  . APPENDECTOMY    . CHOLECYSTECTOMY    . OTHER SURGICAL HISTORY     surgery for Endometriosis  . TEE WITHOUT CARDIOVERSION N/A 03/22/2017   Procedure: TRANSESOPHAGEAL ECHOCARDIOGRAM (TEE);  Surgeon: Yates DecampGanji, Jay, MD;  [facility] END[facility]COPY;  Service: Cardiovascular;  Laterality: N/A;    Family History  Problem Relation Age of Onset  . Diabetes Mother   . Multiple sclerosis Maternal Grandmother   . Heart disease Maternal Grandfather   . Cancer Paternal Grandmother        melanoma  . Heart disease Paternal Grandfather   . Healthy Father     Social History:  reports that she has never smoked. She has never used smokeless tobacco. She reports that she drinks alcohol. She reports that she does not use drugs.  Allergies:  Allergies  Allergen Reactions  . Vicodin [Hydrocodone-Acetaminophen]     Allergies as of 02/01/2018      Reactions   Vicodin [hydrocodone-acetaminophen]       Medication List        Accurate as of 02/01/18  8:53 AM. Always use your most recent med list.          b complex vitamins tablet Take 1 tablet by mouth daily.   C-COMPLEX PO Take  1,000 mg by mouth.   co-enzyme Q-10 30 MG capsule Take 200 mg by mouth daily.   Fish Oil 1360 MG Caps Take 1,600 mg by mouth.   levothyroxine 125 MCG tablet Commonly known as:  SYNTHROID, LEVOTHROID Take 1 tablet (125 mcg total) by mouth daily.   liothyronine 5 MCG tablet Commonly known as:  CYTOMEL Take 5 mcg by mouth daily.   losartan 50 MG tablet Commonly known as:  COZAAR Take 100 mg by mouth daily.   pantoprazole 40 MG tablet Commonly known as:  PROTONIX Take 1 tablet by mouth daily.   VITAMIN D-3 PO Take 2,000 Units by mouth.         Review of Systems  Constitutional: Positive for weight loss. Negative for reduced appetite.       She may have lost a little weight recently.  Previously had weight as low as  140 pounds about 20 years ago and has gradually gained weight  Respiratory: Negative for shortness of breath.   Cardiovascular: Negative for leg swelling.  Gastrointestinal: Negative for constipation.       She has gluten intolerance  Endocrine: Positive for fatigue.  Musculoskeletal: Negative for joint pain.  Skin: Negative for dry skin.  Neurological: Negative for weakness.  Psychiatric/Behavioral: Negative for insomnia.                Examination:    BP 122/80 (BP Location: Left Arm, Patient Position: Sitting, Cuff Size: Normal)   Pulse 68   Ht 5\' 8"  (1.727 m)   Wt 207 lb (93.9 kg)   LMP 10/03/2008   SpO2 98%   BMI 31.47 kg/m   GENERAL:  Average build.  Mild generalized obesity present  No pallor, clubbing, lymphadenopathy or edema.  Skin:  no rash or pigmentation.  EYES:  No prominence of the eyes or swelling of the eyelids  ENT: Oral mucosa and tongue normal.  THYROID:  Not palpable.  HEART:  Normal  S1 and S2; no murmur or click.  CHEST:    Lungs: Vescicular breath sounds heard equally.  No crepitations/ wheeze.  ABDOMEN:  No distention.  Liver and spleen not palpable.  No other mass or tenderness.  NEUROLOGICAL: Reflexes are difficult to elicit, appear normal at the left biceps Unable to elicit at ankles also   JOINTS:  Normal peripheral joints.   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  She has been on a combination of levothyroxine and 5 mcg of liothyronine from an endocrinologist for the last few years However she continues to have fatigue of unclear etiology, not clearly improved with using liothyronine in combination with levothyroxine No recent labs available No goiter on exam and she looks euthyroid  Other medical problems including hypertension, upper lipidemia and history of ascending aortic aneurysm  PLAN:   Check thyroid levels including T3 level today and decide on further dosage adjustment Unless her T3 level  is high she can continue a combination of levothyroxine and liothyronine If no changes made she can be followed annually  Follow-up to be decided   Reather Littler 02/01/2018, 8:53 AM   Consultation note copy sent to the PCP  Note: This office note was prepared with Dragon voice recognition system technology. Any transcriptional errors that result from this process are unintentional.

## 2018-02-21 ENCOUNTER — Other Ambulatory Visit (INDEPENDENT_AMBULATORY_CARE_PROVIDER_SITE_OTHER): Payer: Managed Care, Other (non HMO)

## 2018-02-21 ENCOUNTER — Ambulatory Visit: Payer: Managed Care, Other (non HMO) | Admitting: Internal Medicine

## 2018-02-21 ENCOUNTER — Encounter: Payer: Self-pay | Admitting: Internal Medicine

## 2018-02-21 VITALS — BP 116/78 | HR 68 | Temp 98.3°F | Ht 68.0 in | Wt 213.0 lb

## 2018-02-21 DIAGNOSIS — I712 Thoracic aortic aneurysm, without rupture, unspecified: Secondary | ICD-10-CM

## 2018-02-21 DIAGNOSIS — Z0001 Encounter for general adult medical examination with abnormal findings: Secondary | ICD-10-CM | POA: Insufficient documentation

## 2018-02-21 DIAGNOSIS — Z0000 Encounter for general adult medical examination without abnormal findings: Secondary | ICD-10-CM | POA: Diagnosis not present

## 2018-02-21 DIAGNOSIS — I7121 Aneurysm of the ascending aorta, without rupture: Secondary | ICD-10-CM

## 2018-02-21 LAB — BASIC METABOLIC PANEL
BUN: 12 mg/dL (ref 6–23)
CO2: 27 mEq/L (ref 19–32)
Calcium: 9.6 mg/dL (ref 8.4–10.5)
Chloride: 105 mEq/L (ref 96–112)
Creatinine, Ser: 0.71 mg/dL (ref 0.40–1.20)
GFR: 91.31 mL/min (ref >60.00)
Glucose, Bld: 88 mg/dL (ref 70–99)
Potassium: 4.3 mEq/L (ref 3.5–5.1)
Sodium: 140 mEq/L (ref 135–145)

## 2018-02-21 LAB — CBC WITH DIFFERENTIAL/PLATELET
BASOS ABS: 0 10*3/uL (ref 0.0–0.1)
Basophils Relative: 0.5 % (ref 0.0–3.0)
Eosinophils Absolute: 0.3 10*3/uL (ref 0.0–0.7)
Eosinophils Relative: 5.5 % — ABNORMAL HIGH (ref 0.0–5.0)
HCT: 39.1 % (ref 36.0–46.0)
HEMOGLOBIN: 13.2 g/dL (ref 12.0–15.0)
LYMPHS ABS: 2.3 10*3/uL (ref 0.7–4.0)
Lymphocytes Relative: 38.8 % (ref 12.0–46.0)
MCHC: 33.6 g/dL (ref 30.0–36.0)
MCV: 94.8 fl (ref 78.0–100.0)
MONO ABS: 0.4 10*3/uL (ref 0.1–1.0)
MONOS PCT: 6.7 % (ref 3.0–12.0)
NEUTROS PCT: 48.5 % (ref 43.0–77.0)
Neutro Abs: 2.9 10*3/uL (ref 1.4–7.7)
Platelets: 286 10*3/uL (ref 150.0–400.0)
RBC: 4.13 Mil/uL (ref 3.87–5.11)
RDW: 13.1 % (ref 11.5–15.5)
WBC: 5.9 10*3/uL (ref 4.0–10.5)

## 2018-02-21 LAB — LIPID PANEL
CHOLESTEROL: 182 mg/dL (ref 0–200)
HDL: 64.9 mg/dL (ref >39.00)
LDL Cholesterol: 98 mg/dL (ref 0–99)
NONHDL: 117.48
Total CHOL/HDL Ratio: 3
Triglycerides: 95 mg/dL (ref 0.0–149.0)
VLDL: 19 mg/dL (ref 0.0–40.0)

## 2018-02-21 LAB — HEPATIC FUNCTION PANEL
ALK PHOS: 56 U/L (ref 39–117)
ALT: 25 U/L (ref 0–35)
AST: 19 U/L (ref 0–37)
Albumin: 4 g/dL (ref 3.5–5.2)
BILIRUBIN TOTAL: 0.7 mg/dL (ref 0.2–1.2)
Bilirubin, Direct: 0.1 mg/dL (ref 0.0–0.3)
Total Protein: 6.7 g/dL (ref 6.0–8.3)

## 2018-02-21 LAB — TSH: TSH: 3.68 u[IU]/mL (ref 0.35–4.50)

## 2018-02-21 NOTE — Assessment & Plan Note (Signed)
For f/u cta

## 2018-02-21 NOTE — Assessment & Plan Note (Signed)

## 2018-02-21 NOTE — Patient Instructions (Signed)
Please continue all other medications as before, and refills have been done if requested.  Please have the pharmacy call with any other refills you may need.  Please continue your efforts at being more active, low cholesterol diet, and weight control.  You are otherwise up to date with prevention measures today.  Please keep your appointments with your specialists as you may have planned  You will be contacted regarding the referral for: CTA of the chest  Please go to the LAB in the Basement (turn left off the elevator) for the tests to be done today  You will be contacted by phone if any changes need to be made immediately.  Otherwise, you will receive a letter about your results with an explanation, but please check with MyChart first.  Please remember to sign up for MyChart if you have not done so, as this will be important to you in the future with finding out test results, communicating by private email, and scheduling acute appointments online when needed.  Please return in 1 year for your yearly visit, or sooner if needed, with Lab testing done 3-5 days before

## 2018-02-21 NOTE — Progress Notes (Signed)
Subjective:    Patient ID: Olivia Mendez, female    DOB: March 20, 1966, 52 y.o.   MRN: [id number]  HPI  Here for wellness and f/u;  Overall doing ok;  Pt denies Chest pain, worsening SOB, DOE, wheezing, orthopnea, PND, worsening LE edema, palpitations, dizziness or syncope.  Pt denies neurological change such as new headache, facial or extremity weakness.  Pt denies polydipsia, polyuria, or low sugar symptoms. Pt states overall good compliance with treatment and medications, good tolerability, and has been trying to follow appropriate diet.  Pt denies worsening depressive symptoms, suicidal ideation or panic. No fever, night sweats, wt loss, loss of appetite, or other constitutional symptoms.  Pt states good ability with ADL's, has low fall risk, home safety reviewed and adequate, no other significant changes in hearing or vision, and only occasionally active with exercise. Due for f/u CTA chest for asc thoracic aortic anuerysm and followed per cardiology with bicuspid aortic valve.  Recent TFT's ok,  No new compliants Past Medical History:  Diagnosis Date  . Allergic rhinitis 12/26/2014  . Aortic aneurysm (HCC) 2018  . Endometriosis 1990's  . Hypothyroid   . Migraines    Past Surgical History:  Procedure Laterality Date  . APPENDECTOMY    . CHOLECYSTECTOMY    . OTHER SURGICAL HISTORY     surgery for Endometriosis  . TEE WITHOUT CARDIOVERSION N/A 03/22/2017   Procedure: TRANSESOPHAGEAL ECHOCARDIOGRAM (TEE);  Surgeon: Yates Decamp, MD;  Location: [facility] ENDOSCOPY;  Service: Cardiovascular;  Laterality: N/A;    reports that she has never smoked. She has never used smokeless tobacco. She reports that she drinks alcohol. She reports that she does not use drugs. family history includes Cancer in her paternal grandmother; Diabetes in her mother; Healthy in her father; Heart disease in her maternal grandfather and paternal grandfather; Multiple sclerosis in her maternal grandmother; Thyroid disease in her  cousin. Allergies  Allergen Reactions  . Vicodin [Hydrocodone-Acetaminophen]    Current Outpatient Medications on File Prior to Visit  Medication Sig Dispense Refill  . b complex vitamins tablet Take 1 tablet by mouth daily.    [name] Bioflavonoid Products (C-COMPLEX PO) Take 1,000 mg by mouth.    . Cholecalciferol (VITAMIN D-3 PO) Take 2,000 Units by mouth.    . co-enzyme Q-10 30 MG capsule Take 200 mg by mouth daily.     [name] levothyroxine (SYNTHROID, LEVOTHROID) 125 MCG tablet Take 1 tablet (125 mcg total) by mouth daily. 90 tablet 1  . liothyronine (CYTOMEL) 5 MCG tablet Take 5 mcg by mouth daily.    [name] losartan (COZAAR) 50 MG tablet Take 100 mg by mouth daily.     . Omega-3 Fatty Acids (FISH OIL) 1360 MG CAPS Take 1,600 mg by mouth.    . pantoprazole (PROTONIX) 40 MG tablet Take 1 tablet by mouth daily.  6   No current facility-administered medications on file prior to visit.    Review of Systems Constitutional: Negative for other unusual diaphoresis, sweats, appetite or weight changes HENT: Negative for other worsening hearing loss, ear pain, facial swelling, mouth sores or neck stiffness.   Eyes: Negative for other worsening pain, redness or other visual disturbance.  Respiratory: Negative for other stridor or swelling Cardiovascular: Negative for other palpitations or other chest pain  Gastrointestinal: Negative for worsening diarrhea or loose stools, blood in stool, distention or other pain Genitourinary: Negative for hematuria, flank pain or other change in urine volume.  Musculoskeletal: Negative for myalgias or other joint swelling.  Skin: Negative for other color change, or other wound or worsening drainage.  Neurological: Negative for other syncope or numbness. Hematological: Negative for other adenopathy or swelling Psychiatric/Behavioral: Negative for hallucinations, other worsening agitation, SI, self-injury, or new decreased concentration All other system neg per pt      Objective:   Physical Exam BP 116/78   Pulse 68   Temp 98.3 F (36.8 C) (Oral)   Ht 5\' 8"  (1.727 m)   Wt 213 lb (96.6 kg)   LMP 10/03/2008   SpO2 98%   BMI 32.39 kg/m  VS noted,  Constitutional: Pt is oriented to person, place, and time. Appears well-developed and well-nourished, in no significant distress and comfortable Head: Normocephalic and atraumatic  Eyes: Conjunctivae and EOM are normal. Pupils are equal, round, and reactive to light Right Ear: External ear normal without discharge Left Ear: External ear normal without discharge Nose: Nose without discharge or deformity Mouth/Throat: Oropharynx is without other ulcerations and moist  Neck: Normal range of motion. Neck supple. No JVD present. No tracheal deviation present or significant neck LA or mass Cardiovascular: Normal rate, regular rhythm, normal heart sounds and intact distal pulses.   Pulmonary/Chest: WOB normal and breath sounds without rales or wheezing  Abdominal: Soft. Bowel sounds are normal. NT. No HSM  Musculoskeletal: Normal range of motion. Exhibits no edema Lymphadenopathy: Has no other cervical adenopathy.  Neurological: Pt is alert and oriented to person, place, and time. Pt has normal reflexes. No cranial nerve deficit. Motor grossly intact, Gait intact Skin: Skin is warm and dry. No rash noted or new ulcerations Psychiatric:  Has dysphoric mood and affect. Behavior is normal without agitation No other exam findings Lab Results  Component Value Date   WBC 6.9 11/13/2014   HGB 14.1 11/13/2014   HCT 41.4 11/13/2014   PLT 314.0 11/13/2014   GLUCOSE 89 02/08/2017   CHOL 180 07/24/2012   TRIG 68 07/24/2012   HDL 62 07/24/2012   LDLCALC 104 (H) 07/24/2012   ALT 46 (H) 11/13/2014   AST 29 11/13/2014   NA 139 02/08/2017   K 4.3 02/08/2017   CL 105 02/08/2017   CREATININE 0.63 02/08/2017   BUN 9 02/08/2017   CO2 28 02/08/2017   TSH 3.72 02/01/2018   INR 0.9 ratio 09/14/2008   HGBA1C 5.5  11/13/2014       Assessment & Plan:

## 2018-02-26 ENCOUNTER — Other Ambulatory Visit (INDEPENDENT_AMBULATORY_CARE_PROVIDER_SITE_OTHER): Payer: Managed Care, Other (non HMO)

## 2018-02-26 DIAGNOSIS — Z0000 Encounter for general adult medical examination without abnormal findings: Secondary | ICD-10-CM

## 2018-02-26 LAB — URINALYSIS, ROUTINE W REFLEX MICROSCOPIC
BILIRUBIN URINE: NEGATIVE
Hgb urine dipstick: NEGATIVE
KETONES UR: NEGATIVE
Nitrite: NEGATIVE
RBC / HPF: NONE SEEN (ref 0–?)
Specific Gravity, Urine: 1.015 (ref 1.000–1.030)
Total Protein, Urine: NEGATIVE
URINE GLUCOSE: NEGATIVE
Urobilinogen, UA: 0.2 (ref 0.0–1.0)
pH: 6.5 (ref 5.0–8.0)

## 2018-03-13 ENCOUNTER — Ambulatory Visit (INDEPENDENT_AMBULATORY_CARE_PROVIDER_SITE_OTHER)
Admission: RE | Admit: 2018-03-13 | Discharge: 2018-03-13 | Disposition: A | Discharge status: Home / Self Care | Payer: Managed Care, Other (non HMO) | Source: Ambulatory Visit | Attending: Internal Medicine | Admitting: Internal Medicine

## 2018-03-13 DIAGNOSIS — I712 Thoracic aortic aneurysm, without rupture, unspecified: Secondary | ICD-10-CM

## 2018-03-13 MED ORDER — IOPAMIDOL (ISOVUE-370) INJECTION 76%
100.0000 mL | Freq: Once | INTRAVENOUS | Status: AC | PRN
  Administered 2018-03-13: 100 mL via INTRAVENOUS

## 2018-04-19 ENCOUNTER — Other Ambulatory Visit: Payer: Self-pay

## 2018-04-19 MED ORDER — LIOTHYRONINE SODIUM 5 MCG PO TABS: ORAL_TABLET | ORAL | 2 refills | Status: DC

## 2018-04-19 MED ORDER — LEVOTHYROXINE SODIUM 125 MCG PO TABS: 125.0000 ug | ORAL_TABLET | Freq: Every day | ORAL | 2 refills | Status: DC

## 2018-07-29 ENCOUNTER — Other Ambulatory Visit: Payer: Self-pay | Admitting: Cardiology

## 2018-07-29 DIAGNOSIS — Q2381 Bicuspid aortic valve: Secondary | ICD-10-CM

## 2018-07-29 DIAGNOSIS — Q231 Congenital insufficiency of aortic valve: Secondary | ICD-10-CM

## 2018-07-29 DIAGNOSIS — I7121 Aneurysm of the ascending aorta, without rupture: Secondary | ICD-10-CM

## 2018-07-29 DIAGNOSIS — I712 Thoracic aortic aneurysm, without rupture: Secondary | ICD-10-CM

## 2018-09-10 ENCOUNTER — Other Ambulatory Visit: Payer: Managed Care, Other (non HMO)

## 2018-09-12 ENCOUNTER — Other Ambulatory Visit: Payer: Managed Care, Other (non HMO)

## 2018-09-18 ENCOUNTER — Ambulatory Visit: Payer: Managed Care, Other (non HMO) | Admitting: Cardiology

## 2018-11-04 ENCOUNTER — Ambulatory Visit (INDEPENDENT_AMBULATORY_CARE_PROVIDER_SITE_OTHER): Payer: Managed Care, Other (non HMO)

## 2018-11-04 ENCOUNTER — Other Ambulatory Visit: Payer: Self-pay

## 2018-11-04 DIAGNOSIS — I7121 Aneurysm of the ascending aorta, without rupture: Secondary | ICD-10-CM

## 2018-11-04 DIAGNOSIS — I712 Thoracic aortic aneurysm, without rupture: Secondary | ICD-10-CM

## 2018-11-04 DIAGNOSIS — Q231 Congenital insufficiency of aortic valve: Secondary | ICD-10-CM

## 2018-11-07 NOTE — Progress Notes (Signed)
Virtual Visit via Video Note: This visit type was conducted due to national recommendations for restrictions regarding the COVID-19 Pandemic (e.g. social distancing).  This format is felt to be most appropriate for this patient at this time.  All issues noted in this document were discussed and addressed.  No physical exam was performed (except for noted visual exam findings with Telehealth visits).  The patient has consented to conduct a Telehealth visit and understands insurance will be billed.   I connected with@, on 11/09/18 at  by a video enabled telemedicine application and verified that I am speaking with the correct person using two identifiers.   I discussed the limitations of evaluation and management by telemedicine and the availability of in person appointments. The patient expressed understanding and agreed to proceed.   I have discussed with patient regarding the safety during COVID Pandemic and steps and precautions to be taken including social distancing, frequent hand wash and use of detergent soap, gels with the patient. I asked the patient to avoid touching mouth, nose, eyes, ears with the hands. I encouraged regular walking around the neighborhood and exercise and regular diet, as long as social distancing can be maintained.  Primary Physician/Referring:  Corwin Levins, MD  Patient ID: Olivia Mendez, female    DOB: June 06, 1965, 53 y.o.   MRN: [id number]  Chief Complaint  Patient presents with   AAA    f/u    Aortic Stenosis   HPI: Olivia Mendez  is a 53 y.o. female  with bicuspid AV, ascending thoracic aortic aneurysm measured at 4.1 cm.   Echocardiogram in 2019 correlated with previous CT scan also in 2019. She was started on atenolol due to ascending aortic aneurysm.  She could not tolerate atenolol due to dizziness, presently on losartan 100 mg daily without any side effects.  She now presents for annual visit. She recently underwent repeat echocardiogram and is  here to discuss results.   No associated shortness of breath or pain radiation. Denies PND, orthopnea, palpitations, edema, syncopal episodes, or symptoms of claudication or TIA. Denies any smoking history. She is essentially asymptomatic.   Past Medical History:  Diagnosis Date   Allergic rhinitis 12/26/2014   Aortic aneurysm (HCC) 2018   Endometriosis 1990's   Hypothyroid    Migraines     Past Surgical History:  Procedure Laterality Date   APPENDECTOMY     CHOLECYSTECTOMY     OTHER SURGICAL HISTORY     surgery for Endometriosis   TEE WITHOUT CARDIOVERSION N/A 03/22/2017   Procedure: TRANSESOPHAGEAL ECHOCARDIOGRAM (TEE);  Surgeon: Yates Decamp, MD;  Location: [facility];  Service: Cardiovascular;  Laterality: N/A;    Social History   Socioeconomic History   Marital status: Married    Spouse name: Not on file   Number of children: 0   Years of education: 16   Highest education level: Not on file  Occupational History   Occupation: Warden/ranger  Social Needs   Financial resource strain: Not on file   Food insecurity:    Worry: Not on file    Inability: Not on file   Transportation needs:    Medical: Not on file    Non-medical: Not on file  Tobacco Use   Smoking status: Never Smoker   Smokeless tobacco: Never Used  Substance and Sexual Activity   Alcohol use: Yes    Alcohol/week: 0.0 standard drinks    Comment: socially rare   Drug use: No   Sexual  activity: Yes    Birth control/protection: Other-see comments    Comment: husband vasectomy  Lifestyle   Physical activity:    Days per week: Not on file    Minutes per session: Not on file   Stress: Not on file  Relationships   Social connections:    Talks on phone: Not on file    Gets together: Not on file    Attends religious service: Not on file    Active member of club or organization: Not on file    Attends meetings of clubs or organizations: Not on file    Relationship status:  Not on file   Intimate partner violence:    Fear of current or ex partner: Not on file    Emotionally abused: Not on file    Physically abused: Not on file    Forced sexual activity: Not on file  Other Topics Concern   Not on file  Social History Narrative   Fun: Garden, read   Denies religious beliefs effecting health care.     Current Outpatient Medications on File Prior to Visit  Medication Sig Dispense Refill   b complex vitamins tablet Take 1 tablet by mouth daily.     Bioflavonoid Products (C-COMPLEX PO) Take 1,000 mg by mouth.     Cholecalciferol (VITAMIN D-3 PO) Take 2,000 Units by mouth.     co-enzyme Q-10 30 MG capsule Take 200 mg by mouth daily.      levothyroxine (SYNTHROID, LEVOTHROID) 125 MCG tablet Take 1 tablet (125 mcg total) by mouth daily. 90 tablet 2   liothyronine (CYTOMEL) 5 MCG tablet Take one tablet daily 90 tablet 2   losartan (COZAAR) 100 MG tablet Take 100 mg by mouth daily.      Omega-3 Fatty Acids (FISH OIL) 1360 MG CAPS Take 1,600 mg by mouth.     pantoprazole (PROTONIX) 40 MG tablet Take 1 tablet by mouth daily.  6   No current facility-administered medications on file prior to visit.     Review of Systems  Constitution: Negative for decreased appetite, malaise/fatigue, weight gain and weight loss.  Eyes: Negative for visual disturbance.  Cardiovascular: Negative for chest pain, claudication, dyspnea on exertion, leg swelling, orthopnea, palpitations and syncope.  Respiratory: Negative for hemoptysis and wheezing.   Endocrine: Negative for cold intolerance and heat intolerance.  Hematologic/Lymphatic: Does not bruise/bleed easily.  Skin: Negative for nail changes.  Musculoskeletal: Negative for muscle weakness and myalgias.  Gastrointestinal: Negative for abdominal pain, change in bowel habit, nausea and vomiting.  Neurological: Negative for difficulty with concentration, dizziness, focal weakness and headaches.  Psychiatric/Behavioral:  Negative for altered mental status and suicidal ideas.  All other systems reviewed and are negative.     Objective  Height  (1.727 m), weight 218 lb (98.9 kg), last menstrual period 10/03/2008. Body mass index is 33.15 kg/m.    Physical exam not performed or limited due to virtual visit.  Patient appeared to be in no distress, Neck was supple, respiration was not labored.  Please see exam details from prior visit is as below.  Physical Exam  Constitutional: She is oriented to person, place, and time. Vital signs are normal. She appears well-developed and well-nourished.   Mildly obese  HENT:  Head: Normocephalic and atraumatic.  Neck: Normal range of motion.  Cardiovascular: Normal rate, regular rhythm and intact distal pulses.  Murmur heard.  Crescendo systolic murmur is present with a grade of 2/6 at the upper right sternal border.  Ejection systolic click present Pulmonary/Chest: Effort normal and breath sounds normal. No accessory muscle usage. No respiratory distress.  Abdominal: Soft. Bowel sounds are normal.  Prominent abdominal aortic pulsation  Musculoskeletal: Normal range of motion.  Neurological: She is alert and oriented to person, place, and time.  Skin: Skin is warm and dry.  Vitals reviewed.  Laboratory examination:   CMP Latest Ref Rng & Units 02/21/2018 02/08/2017 11/13/2014  Glucose 70 - 99 mg/dL 88 89 88  BUN 6 - 23 mg/dL 12 9 12   Creatinine 0.40 - 1.20 mg/dL 4.62 7.03 5.00  Sodium 135 - 145 mEq/L 140 139 138  Potassium 3.5 - 5.1 mEq/L 4.3 4.3 4.6  Chloride 96 - 112 mEq/L 105 105 104  CO2 19 - 32 mEq/L 27 28 28   Calcium 8.4 - 10.5 mg/dL 9.6 93.8 18.2  Total Protein 6.0 - 8.3 g/dL 6.7 - 7.1  Total Bilirubin 0.2 - 1.2 mg/dL 0.7 - 0.7  Alkaline Phos 39 - 117 U/L 56 - 63  AST 0 - 37 U/L 19 - 29  ALT 0 - 35 U/L 25 - 46(H)   CBC Latest Ref Rng & Units 02/21/2018 11/13/2014 07/24/2012  WBC 4.0 - 10.5 K/uL 5.9 6.9 6.9  Hemoglobin 12.0 - 15.0 g/dL 99.3 71.6  96.7  Hematocrit 36.0 - 46.0 % 39.1 41.4 37.9  Platelets 150.0 - 400.0 K/uL 286.0 314.0 267  : Lipid Panel     Component Value Date/Time   CHOL 182 02/21/2018 0917   TRIG 95.0 02/21/2018 0917   HDL 64.90 02/21/2018 0917   CHOLHDL 3 02/21/2018 0917   VLDL 19.0 02/21/2018 0917   LDLCALC 98 02/21/2018 0917   HEMOGLOBIN A1C Lab Results  Component Value Date   HGBA1C 5.5 11/13/2014   TSH Recent Labs    02/01/18 0909 02/21/18 0917  TSH 3.72 3.68    Cardiac Studies:   Treadmill stress test Apr 16, 2017: Indication: Chest pain The patient exercised on Bruce protocol. Exercise time was 06:59 min . Patient achieved maximum HR of 158 BPM. The target heart rate was 111%, 85% of 94% . Resting EKG demonstrated NSR. Stress EKG: ST Changes: Depression upsloping, noted at pea/k exercise but back to baseline immediately into recovery at < 1 minute. Arrhythmias: ventricular premature beats-isolated. Chest Pain: non-limiting. BP Response to Exercise: Normal resting BP- appropriate response. HR Response to Exercise: Appropriat.e. Exercise capacity was normal. Overall Impression: Normal stress test. Exercise capacity was normal. Suspect GERD to be etiology for cough and chest pain with exercise. Clinical correlation recommended. Low risk stress EKG.  Echocardiogram 11/04/2018:  Normal LV systolic function with EF 57%. Left ventricle cavity is normal in size. Normal global wall motion. Normal diastolic filling pattern. Calculated EF 57%. Right ventricle cavity is borderline dilated. Normal right ventricular function. Not well visualized. Possible Bicuspid aortic valve with no regurgitation noted. Mildly restricted aortic valve leaflets. Trace aortic valve stenosis. Aortic valve peak pressure gradient of  20 and mean gradient of 10 mmHg, calculated aortic valve area 1.83 cm. Structurally normal mitral valve. Mild (Grade I) mitral regurgitation. The aortic root is mildly dilated at 3.8 cm. IVC is  dilated with respiratory variation. This may suggest elevated right heart pressure No significant change from 09/04/2017.  CTA chest : 03/13/2018 Mild fusiform aneurysmal dilatation of the tubular portion of the ascending thoracic aorta with a maximal diameter of 4.2 cm. Prior measurements were likely exaggerated by underlying cardiac motion artifact. Aortic size continues to meet criteria for annual surveillance with  CT or MR arteriogram.  TEE 03/22/2017: Normal LV. Bicuspid aortic valve without any thickening or calcification. Aortic root dilated at 4 cm.  Assessment   Bicuspid aortic valve  Ascending aortic aneurysm [facility](HCC)  Thoracic aortic aneurysm without rupture (HCC) - Plan: CT ANGIO CHEST AORTA W/CM &/OR WO/CM  Recommendations:   She is presently doing well and essentially asymptomatic.  Echocardiogram reveals aortic root size and ascending aorta to be 3.8 cm, essentially close to 4 cm I think it was under reported.  Overall over the past 3 years aortic aneurysm size has remained stable.  I am trying to avoid radiation exposure on a annual basis.  I will order CT angiogram of the chest for 2021 sometime in July or August and see her back after this.  She is now on maximum dose of losartan which she is tolerating.  Labs have been stable.  We recommended weight loss as she has gained weight since her mother's death a month ago.  Otherwise I did not make any changes to her medications.  Yates DecampJay Leinaala Catanese, MD, Beck[place] SpringsFACC 11/09/2018, 8:51 AM [facility] [place] Pager: [phone] Office: [phone] If no answer Cell [phone]

## 2018-11-08 ENCOUNTER — Ambulatory Visit (INDEPENDENT_AMBULATORY_CARE_PROVIDER_SITE_OTHER): Payer: Managed Care, Other (non HMO) | Admitting: Cardiology

## 2018-11-08 ENCOUNTER — Encounter: Payer: Self-pay | Admitting: Cardiology

## 2018-11-08 ENCOUNTER — Other Ambulatory Visit: Payer: Self-pay

## 2018-11-08 VITALS — BP 126/77 | Ht 68.0 in | Wt 218.0 lb

## 2018-11-08 DIAGNOSIS — I712 Thoracic aortic aneurysm, without rupture, unspecified: Secondary | ICD-10-CM

## 2018-11-08 DIAGNOSIS — Q231 Congenital insufficiency of aortic valve: Secondary | ICD-10-CM

## 2018-11-08 DIAGNOSIS — I7121 Aneurysm of the ascending aorta, without rupture: Secondary | ICD-10-CM

## 2018-12-16 ENCOUNTER — Ambulatory Visit: Payer: Self-pay | Admitting: Internal Medicine

## 2018-12-16 ENCOUNTER — Other Ambulatory Visit: Payer: Self-pay

## 2018-12-16 ENCOUNTER — Telehealth: Payer: Self-pay | Admitting: *Deleted

## 2018-12-16 ENCOUNTER — Encounter: Payer: Self-pay | Admitting: Internal Medicine

## 2018-12-16 ENCOUNTER — Ambulatory Visit (INDEPENDENT_AMBULATORY_CARE_PROVIDER_SITE_OTHER): Payer: Managed Care, Other (non HMO) | Admitting: Internal Medicine

## 2018-12-16 ENCOUNTER — Encounter: Payer: Managed Care, Other (non HMO) | Admitting: Women's Health

## 2018-12-16 DIAGNOSIS — E039 Hypothyroidism, unspecified: Secondary | ICD-10-CM

## 2018-12-16 DIAGNOSIS — B349 Viral infection, unspecified: Secondary | ICD-10-CM | POA: Diagnosis not present

## 2018-12-16 DIAGNOSIS — Z20822 Contact with and (suspected) exposure to covid-19: Secondary | ICD-10-CM

## 2018-12-16 DIAGNOSIS — F411 Generalized anxiety disorder: Secondary | ICD-10-CM

## 2018-12-16 NOTE — Patient Instructions (Signed)
You will be contacted regarding the referral for: [facility] testing pool for covid testing  Please continue all other medications as before, and refills have been done if requested.  Please have the pharmacy call with any other refills you may need.  Please keep your appointments with your specialists as you may have planned  Please call with any worsening fever, cough and shortness of breath

## 2018-12-16 NOTE — Telephone Encounter (Signed)
Pt scheduled for covid testing today @ 11:30 @ [facility]. Instructions given and order placed  

## 2018-12-16 NOTE — Assessment & Plan Note (Signed)
Exam most c/w viral illness, cant r/o COVID with recent possible exposure, ok for refer for COVID testing

## 2018-12-16 NOTE — Progress Notes (Signed)
Patient ID: Olivia Mendez, female   DOB: 1966/02/21, 53 y.o.   MRN: [id number]  Virtual Visit via Video Note  I connected with Christoper Allegra on 12/16/18 at  9:20 AM EDT by a video enabled telemedicine application and verified that I am speaking with the correct person using two identifiers.  Location: Patient: at home Provider: at office   I discussed the limitations of evaluation and management by telemedicine and the availability of in person appointments. The patient expressed understanding and agreed to proceed.  History of Present Illness: Here with onset 2 days ST, low grade temp, achy, diarrhea, chills, and ear clogged  Pt denies chest pain, increased sob or doe, wheezing, orthopnea, PND, increased LE swelling, palpitations, dizziness or syncope.  Pt denies new neurological symptoms such as new headache, or facial or extremity weakness or numbness   Pt denies polydipsia, polyuria  Had been working from home for 3 months until last wk, when she want back, and sat across the cubicle desk with a symptomatic person who has testing pending.  She is now back to working at home today.  Was wearing mask at all times at workplace.  Denies hyper or hypo thyroid symptoms such as voice, skin or hair change.  Denies worsening depressive symptoms, suicidal ideation, or panic  Past Medical History:  Diagnosis Date  . Allergic rhinitis 12/26/2014  . Aortic aneurysm ([place]) 2018  . Endometriosis 1990's  . Hypothyroid   . Migraines    Past Surgical History:  Procedure Laterality Date  . APPENDECTOMY    . CHOLECYSTECTOMY    . OTHER SURGICAL HISTORY     surgery for Endometriosis  . TEE WITHOUT CARDIOVERSION N/A 03/22/2017   Procedure: TRANSESOPHAGEAL ECHOCARDIOGRAM (TEE);  Surgeon: Adrian Prows, MD;  Location: [facility] END[facility]COPY;  Service: Cardiovascular;  Laterality: N/A;    reports that she has never smoked. She has never used smokeless tobacco. She reports current alcohol use. She reports that she does not  use drugs. family history includes Cancer in her paternal grandmother; Diabetes in her mother; Healthy in her father; Heart disease in her maternal grandfather and paternal grandfather; Multiple sclerosis in her maternal grandmother; Thyroid disease in her cousin. Allergies  Allergen Reactions  . Vicodin [Hydrocodone-Acetaminophen] Nausea And Vomiting   Current Outpatient Medications on File Prior to Visit  Medication Sig Dispense Refill  . b complex vitamins tablet Take 1 tablet by mouth daily.    [name] Bioflavonoid Products (C-COMPLEX PO) Take 1,000 mg by mouth.    . Cholecalciferol (VITAMIN D-3 PO) Take 2,000 Units by mouth.    . co-enzyme Q-10 30 MG capsule Take 200 mg by mouth daily.     [name] levothyroxine (SYNTHROID, LEVOTHROID) 125 MCG tablet Take 1 tablet (125 mcg total) by mouth daily. 90 tablet 2  . liothyronine (CYTOMEL) 5 MCG tablet Take one tablet daily 90 tablet 2  . losartan (COZAAR) 100 MG tablet Take 100 mg by mouth daily.     . Omega-3 Fatty Acids (FISH OIL) 1360 MG CAPS Take 1,600 mg by mouth.    . pantoprazole (PROTONIX) 40 MG tablet Take 1 tablet by mouth daily.  6   No current facility-administered medications on file prior to visit.     Observations/Objective: Alert, NAD, mild ill and fatigued appearing, has appropriate mood and affect, resps normal, cn 2-12 intact, moves all 4s, no visible rash or swelling Lab Results  Component Value Date   WBC 5.9 02/21/2018   HGB 13.2 02/21/2018  HCT 39.1 02/21/2018   PLT 286.0 02/21/2018   GLUCOSE 88 02/21/2018   CHOL 182 02/21/2018   TRIG 95.0 02/21/2018   HDL 64.90 02/21/2018   LDLCALC 98 02/21/2018   ALT 25 02/21/2018   AST 19 02/21/2018   NA 140 02/21/2018   K 4.3 02/21/2018   CL 105 02/21/2018   CREATININE 0.71 02/21/2018   BUN 12 02/21/2018   CO2 27 02/21/2018   TSH 3.68 02/21/2018   INR 0.9 ratio 09/14/2008   HGBA1C 5.5 11/13/2014   Assessment and Plan: See notes  Follow Up Instructions: See notes   I  discussed the assessment and treatment plan with the patient. The patient was provided an opportunity to ask questions and all were answered. The patient agreed with the plan and demonstrated an understanding of the instructions.   The patient was advised to call back or seek an in-person evaluation if the symptoms worsen or if the condition fails to improve as anticipated.   Oliver BarreJames Donyale Falcon, MD

## 2018-12-16 NOTE — Telephone Encounter (Signed)
Noted  

## 2018-12-16 NOTE — Telephone Encounter (Signed)
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

## 2018-12-16 NOTE — Telephone Encounter (Signed)
Virtual visit has been made with Dr.John.  °

## 2018-12-16 NOTE — Telephone Encounter (Signed)
This weekend woke up with a sore throat since Saturday.   Headache and diarrhea, chills, aching and nausea.  No known exposures to COVID-19.   I was concerned about needing to be tested.   No fever.   Yesterday afternoon it was 99.4.   Been 99 since then, no higher.    I'm usually 97 is more normal.    I transferred the call to Dr. Gwynn Burly office to be scheduled.  I transferred the call to [facility] in the office.  I sent my triage notes to the office.   Reason for Disposition . Earache also present  Answer Assessment - Initial Assessment Questions 1. ONSET: "When did the throat start hurting?" (Hours or days ago)      Since Saturday.  I work in an [employer] pl[employer] but I wore a mask the whole time.   2. SEVERITY: "How bad is the sore throat?" (Scale 1-10; mild, moderate or severe)   - MILD (1-3):  doesn't interfere with eating or normal activities   - MODERATE (4-7): interferes with eating some solids and normal activities   - SEVERE (8-10):  excruciating pain, interferes with most normal activities   - SEVERE DYSPHAGIA: can't swallow liquids, drooling     Mild  3. STREP EXPOSURE: "Has there been any exposure to strep within the past week?" If so, ask: "What type of contact occurred?"      No 4.  VIRAL SYMPTOMS: "Are there any symptoms of a cold, such as a runny nose, cough, hoarse voice or red eyes?"      Headache.   Diarrhea in the morning on Saturday and a little yesterday.    Last week I had some diarrhea for a couple of days that was bad. 5. FEVER: "Do you have a fever?" If so, ask: "What is your temperature, how was it measured, and when did it start?"     Low grade in the 99. 6. PUS ON THE TONSILS: "Is there pus on the tonsils in the back of your throat?"     Not looked 7. OTHER SYMPTOMS: "Do you have any other symptoms?" (e.g., difficulty breathing, headache, rash)     Headache, diarrhea, earache in both ears, a little nausea.   I feel some pressure around my eyes with my eyes burning  a little.   Don't have nasal congestion. 8. PREGNANCY: "Is there any chance you are pregnant?" "When was your last menstrual period?"     Not asked due to age  Protocols used: SORE THROAT-A-AH

## 2018-12-16 NOTE — Assessment & Plan Note (Signed)
stable overall by history and exam, recent data reviewed with pt, and pt to continue medical treatment as before,  to f/u any worsening symptoms or concerns  

## 2018-12-20 ENCOUNTER — Encounter: Payer: Self-pay | Admitting: Internal Medicine

## 2018-12-20 LAB — NOVEL CORONAVIRUS, NAA: SARS-CoV-2, NAA: NOT DETECTED

## 2019-01-06 ENCOUNTER — Other Ambulatory Visit: Payer: Self-pay

## 2019-01-07 ENCOUNTER — Encounter: Payer: Self-pay | Admitting: Women's Health

## 2019-01-07 ENCOUNTER — Ambulatory Visit (INDEPENDENT_AMBULATORY_CARE_PROVIDER_SITE_OTHER): Payer: Managed Care, Other (non HMO) | Admitting: Women's Health

## 2019-01-07 VITALS — BP 128/80 | Ht 68.0 in | Wt 226.0 lb

## 2019-01-07 DIAGNOSIS — Z01419 Encounter for gynecological examination (general) (routine) without abnormal findings: Secondary | ICD-10-CM

## 2019-01-07 NOTE — Progress Notes (Signed)
Olivia Mendez [birth year] [id number]    History:    Presents for breast and pelvic exam.  Postmenopausal on no HRT with no bleeding.  Endocrinologist manages hypothyroidism, primary care manages hypertension.  IBS.  2010- colonoscopy.  2019 ascending aortic aneurysm has every 6 months follow-up.  Past medical history, past surgical history, family history and social history were all reviewed and documented in the EPIC chart.  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  Husband's business is Chief Operating Officer by [employer].  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  ROS:  A ROS was performed and pertinent positives and negatives are included.  Exam:  Vitals:   01/07/19 1510  BP: 128/80  Weight: 226 lb (102.5 kg)  Height: 5\' 8"  (1.727 m)   Body mass index is 34.36 kg/m.   General appearance:  Normal Thyroid:  Symmetrical, normal in size, without palpable masses or nodularity. Respiratory  Auscultation:  Clear without wheezing or rhonchi Cardiovascular  Auscultation:  Regular rate, without rubs, murmurs or gallops  Edema/varicosities:  Not grossly evident Abdominal  Soft,nontender, without masses, guarding or rebound.  Liver/spleen:  No organomegaly noted  Hernia:  None appreciated  Skin  Inspection:  Grossly normal   Breasts: Examined lying and sitting.     Right: Without masses, retractions, discharge or axillary adenopathy.     Left: Without masses, retractions, discharge or axillary adenopathy. Gentitourinary   Inguinal/mons:  Normal without inguinal adenopathy  External genitalia:  Normal  BUS/Urethra/Skene's glands:  Normal  Vagina:  Normal  Cervix:  Normal  Uterus:   normal in size, shape and contour.  Midline and mobile  Adnexa/parametria:     Rt: Without masses or tenderness.   Lt: Without masses or tenderness.  Anus and perineum: Normal  Digital rectal exam: Normal sphincter tone without palpated masses or tenderness  Assessment/Plan:  53 y.o. MWF G0 for annual exam with no  complaints.  Postmenopausal/no HRT/no bleeding Hypothyroidism-endocrinologist manages Hypertension-primary care manages meds and labs Obesity 2019 ascending aortic aneurysm-has follow-up scheduled  Plan: SBEs, reviewed importance of annual screening mammogram overdue, breast center information given instructed to schedule.  Encouraged increased regular cardio type exercise, decrease calorie/carbs.  Colonoscopy due has had at [facility] instructed to call and schedule follow-up colonoscopy.  Pap normal 2019, new screening guidelines reviewed.    [place], 3:52 PM 01/07/2019

## 2019-01-07 NOTE — Patient Instructions (Addendum)
Repeat colonoscopy  mammogram  [id number] [facility] Vit d3 2000   Health Maintenance for Postmenopausal Women Menopause is a normal process in which your ability to get pregnant comes to an end. This process happens slowly over many months or years, usually between the ages of 13 and 27. Menopause is complete when you have missed your menstrual periods for 12 months. It is important to talk with your health care provider about some of the most common conditions that affect women after menopause (postmenopausal women). These include heart disease, cancer, and bone loss (osteoporosis). Adopting a healthy lifestyle and getting preventive care can help to promote your health and wellness. The actions you take can also lower your chances of developing some of these common conditions. What should I know about menopause? During menopause, you may get a number of symptoms, such as:  Hot flashes. These can be moderate or severe.  Night sweats.  Decrease in sex drive.  Mood swings.  Headaches.  Tiredness.  Irritability.  Memory problems.  Insomnia. Choosing to treat or not to treat these symptoms is a decision that you make with your health care provider. Do I need hormone replacement therapy?  Hormone replacement therapy is effective in treating symptoms that are caused by menopause, such as hot flashes and night sweats.  Hormone replacement carries certain risks, especially as you become older. If you are thinking about using estrogen or estrogen with progestin, discuss the benefits and risks with your health care provider. What is my risk for heart disease and stroke? The risk of heart disease, heart attack, and stroke increases as you age. One of the causes may be a change in the body's hormones during menopause. This can affect how your body uses dietary fats, triglycerides, and cholesterol. Heart attack and stroke are medical emergencies. There are many things that you can do to help  prevent heart disease and stroke. Watch your blood pressure  High blood pressure causes heart disease and increases the risk of stroke. This is more likely to develop in people who have high blood pressure readings, are of African descent, or are overweight.  Have your blood pressure checked: ? Every 3-5 years if you are 62-50 years of age. ? Every year if you are 48 years old or older. Eat a healthy diet   Eat a diet that includes plenty of vegetables, fruits, low-fat dairy products, and lean protein.  Do not eat a lot of foods that are high in solid fats, added sugars, or sodium. Get regular exercise Get regular exercise. This is one of the most important things you can do for your health. Most adults should:  Try to exercise for at least 150 minutes each week. The exercise should increase your heart rate and make you sweat (moderate-intensity exercise).  Try to do strengthening exercises at least twice each week. Do these in addition to the moderate-intensity exercise.  Spend less time sitting. Even light physical activity can be beneficial. Other tips  Work with your health care provider to achieve or maintain a healthy weight.  Do not use any products that contain nicotine or tobacco, such as cigarettes, e-cigarettes, and chewing tobacco. If you need help quitting, ask your health care provider.  Know your numbers. Ask your health care provider to check your cholesterol and your blood sugar (glucose). Continue to have your blood tested as directed by your health care provider. Do I need screening for cancer? Depending on your health history and family history, you  may need to have cancer screening at different stages of your life. This may include screening for:  Breast cancer.  Cervical cancer.  Lung cancer.  Colorectal cancer. What is my risk for osteoporosis? After menopause, you may be at increased risk for osteoporosis. Osteoporosis is a condition in which bone  destruction happens more quickly than new bone creation. To help prevent osteoporosis or the bone fractures that can happen because of osteoporosis, you may take the following actions:  If you are 2-21 years old, get at least 1,000 mg of calcium and at least 600 mg of vitamin D per day.  If you are older than age 73 but younger than age 20, get at least 1,200 mg of calcium and at least 600 mg of vitamin D per day.  If you are older than age 69, get at least 1,200 mg of calcium and at least 800 mg of vitamin D per day. Smoking and drinking excessive alcohol increase the risk of osteoporosis. Eat foods that are rich in calcium and vitamin D, and do weight-bearing exercises several times each week as directed by your health care provider. How does menopause affect my mental health? Depression may occur at any age, but it is more common as you become older. Common symptoms of depression include:  Low or sad mood.  Changes in sleep patterns.  Changes in appetite or eating patterns.  Feeling an overall lack of motivation or enjoyment of activities that you previously enjoyed.  Frequent crying spells. Talk with your health care provider if you think that you are experiencing depression. General instructions See your health care provider for regular wellness exams and vaccines. This may include:  Scheduling regular health, dental, and eye exams.  Getting and maintaining your vaccines. These include: ? Influenza vaccine. Get this vaccine each year before the flu season begins. ? Pneumonia vaccine. ? Shingles vaccine. ? Tetanus, diphtheria, and pertussis (Tdap) booster vaccine. Your health care provider may also recommend other immunizations. Tell your health care provider if you have ever been abused or do not feel safe at home. Summary  Menopause is a normal process in which your ability to get pregnant comes to an end.  This condition causes hot flashes, night sweats, decreased  interest in sex, mood swings, headaches, or lack of sleep.  Treatment for this condition may include hormone replacement therapy.  Take actions to keep yourself healthy, including exercising regularly, eating a healthy diet, watching your weight, and checking your blood pressure and blood sugar levels.  Get screened for cancer and depression. Make sure that you are up to date with all your vaccines. This information is not intended to replace advice given to you by your health care provider. Make sure you discuss any questions you have with your health care provider. Document Released: 07/14/2005 Document Revised: 05/15/2018 Document Reviewed: 05/15/2018 Elsevier Patient Education  2020 [employer].

## 2019-02-05 IMAGING — DX DG CHEST 2V
2 series · 2 of 2 positions shown · non-contrast
Comparison: 12/25/2016

CLINICAL DATA: Chest pain.

EXAM:
CHEST  2 VIEW

[chest pa]
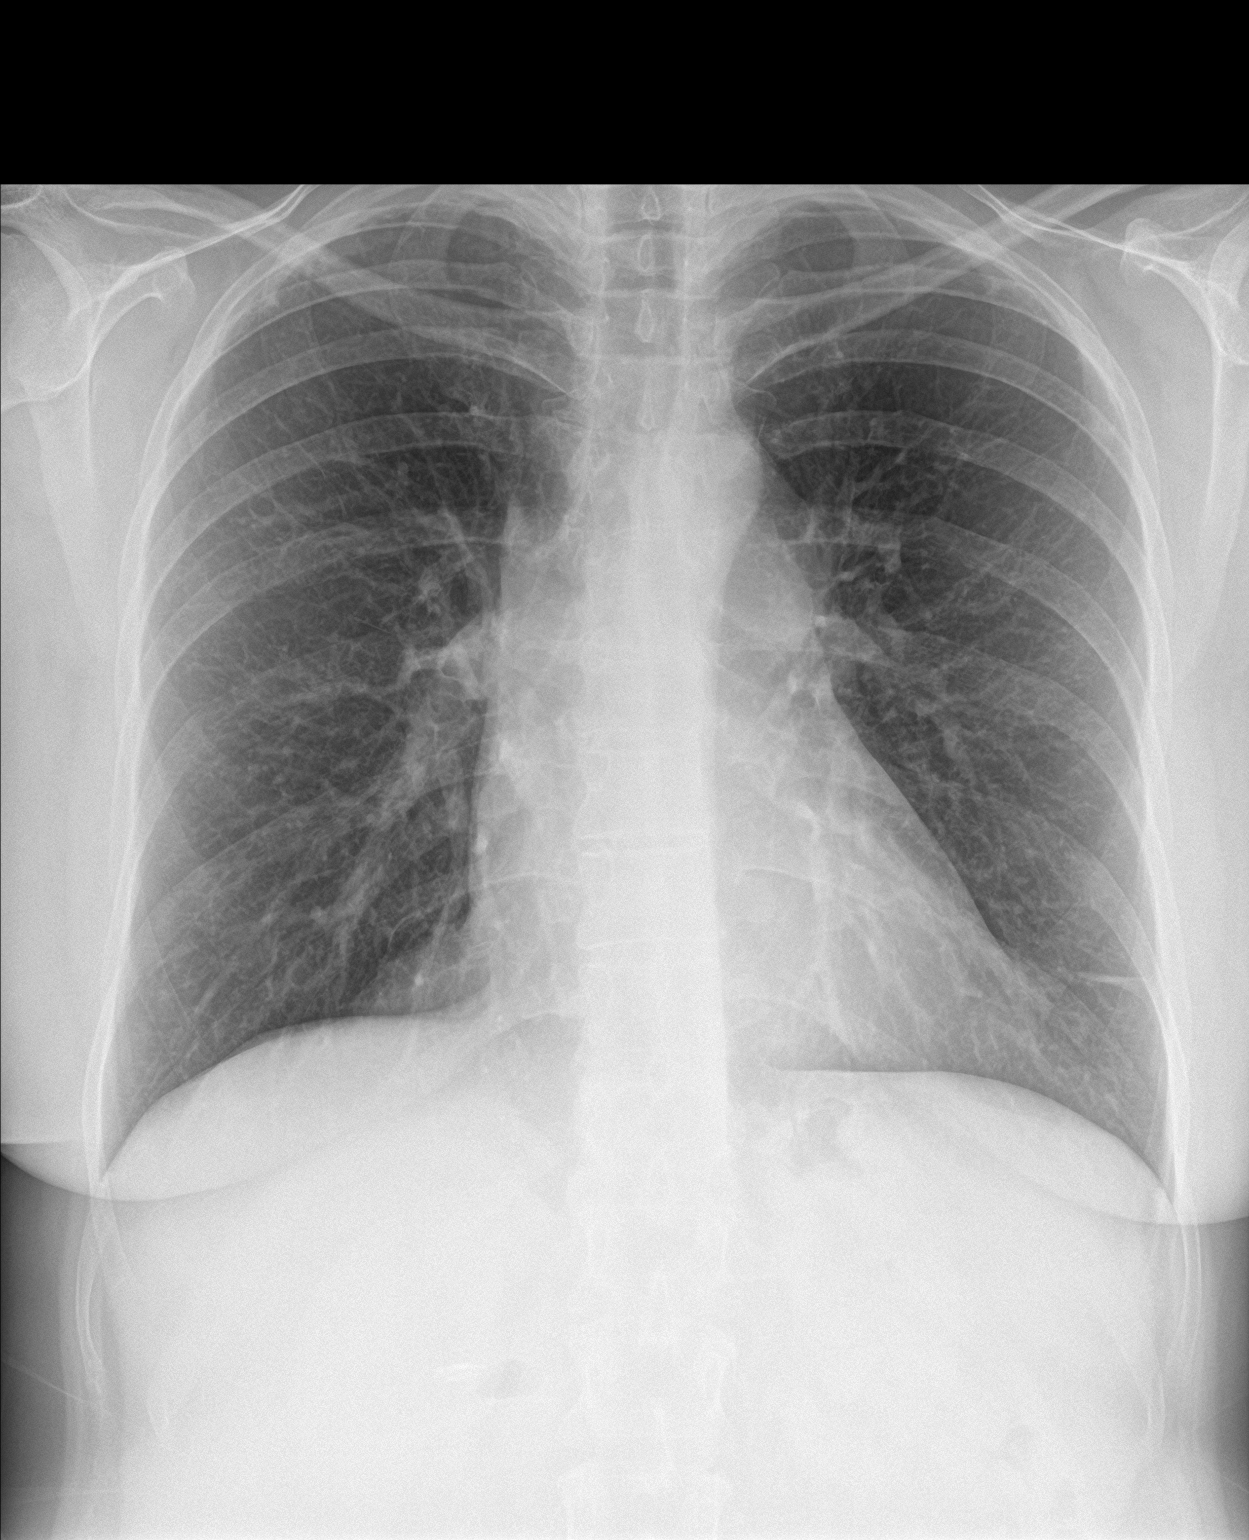

[chest lat]
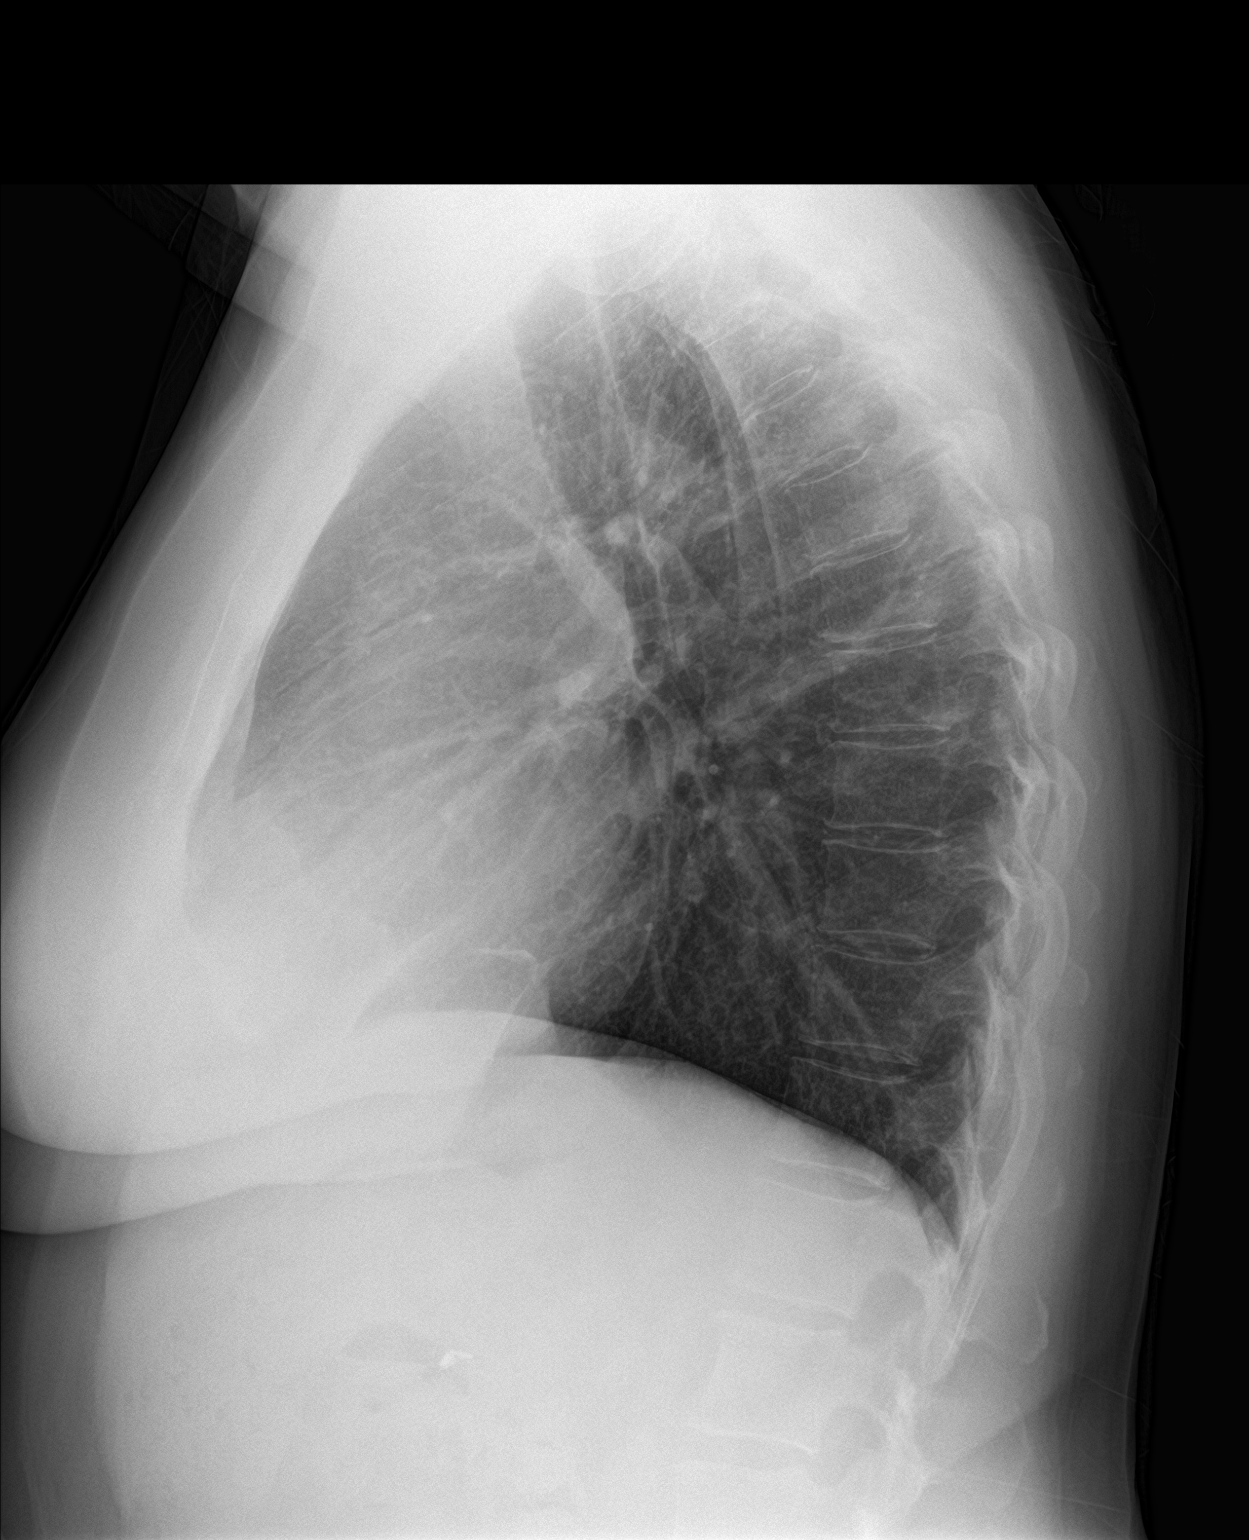

[2 of 2 positions shown; findings below may reference images not displayed]

FINDINGS: There is fullness of the mediastinum at the level of the superior
aspect of the left hilum and at the level of the aortic arch. The
possibility of mediastinal adenopathy should be considered. Overall
heart size is normal. The lungs are clear except for minimal apical
pleural thickening bilaterally as well as a small focal area of
linear scarring at the left lung base laterally. No effusions. No
acute bone abnormality.
IMPRESSION: Fullness of the region of the aortopulmonary window. CT scan of the
chest with intravenous contrast recommended to exclude adenopathy.

## 2019-02-15 ENCOUNTER — Encounter: Payer: Self-pay | Admitting: Family Medicine

## 2019-02-15 ENCOUNTER — Telehealth (INDEPENDENT_AMBULATORY_CARE_PROVIDER_SITE_OTHER): Payer: Managed Care, Other (non HMO) | Admitting: Family Medicine

## 2019-02-15 ENCOUNTER — Other Ambulatory Visit: Payer: Self-pay

## 2019-02-15 VITALS — HR 23 | Ht 68.0 in

## 2019-02-15 DIAGNOSIS — R51 Headache: Secondary | ICD-10-CM

## 2019-02-15 DIAGNOSIS — R519 Headache, unspecified: Secondary | ICD-10-CM

## 2019-02-15 DIAGNOSIS — J069 Acute upper respiratory infection, unspecified: Secondary | ICD-10-CM

## 2019-02-15 DIAGNOSIS — R062 Wheezing: Secondary | ICD-10-CM | POA: Diagnosis not present

## 2019-02-15 MED ORDER — PREDNISONE 20 MG PO TABS: 20.0000 mg | ORAL_TABLET | Freq: Every day | ORAL | 0 refills | Status: AC

## 2019-02-15 MED ORDER — ALBUTEROL SULFATE HFA 108 (90 BASE) MCG/ACT IN AERS: 2.0000 | INHALATION_SPRAY | Freq: Four times a day (QID) | RESPIRATORY_TRACT | 0 refills | Status: DC | PRN

## 2019-02-15 NOTE — Progress Notes (Signed)
Virtual Visit via Video Note   I connected with Ms Yazel on 02/15/19 by a video enabled telemedicine application and verified that I am speaking with the correct person using two identifiers.  Location patient: home Location provider:work office Persons participating in the virtual visit: patient, provider  I discussed the limitations of evaluation and management by telemedicine and the availability of in person appointments. The patient expressed understanding and agreed to proceed.   HPI: Ms Ruffing is a 53 yo female c/o fever and body aches. Yesterday she started with fever, body aches, decreased appetite,fatigue, and global headache. Headache today is 7/10, dull pain. She has history of migraine but she doe snot think this a migraine headache. Denies associated visual changes, photophobia, or focal deficit.  Temperature 100.5 F, today 99.0 F. She took Aspirin earlier today.  Nasal congestion and rhinorrhea, mild nausea and "little" RUQ abdominal pain. Denies sore throat, dysphagia,CP,dyspnea,palpitations,vomiting,changes in bowel habits,urinary symptoms,or rash.  Negative for changes in the smell or taste.  She is also reporting wheezing when lying down or sitting, it is not exacerbated by exertion. Occasionally nonproductive cough. She has history of allergy rhinitis and GERD, denies history of asthma or COPD. Negative for tobacco use.  Negative for sick contacts, including COVID-19 infected person.  ROS: See pertinent positives and negatives per HPI.  Past Medical History:  Diagnosis Date  . Allergic rhinitis 12/26/2014  . Aortic aneurysm (HCC) 2018  . Endometriosis 1990's  . Hypothyroid   . Migraines     Past Surgical History:  Procedure Laterality Date  . APPENDECTOMY    . CHOLECYSTECTOMY    . OTHER SURGICAL HISTORY     surgery for Endometriosis  . TEE WITHOUT CARDIOVERSION N/A 03/22/2017   Procedure: TRANSESOPHAGEAL ECHOCARDIOGRAM (TEE);  Surgeon: Yates Decamp, MD;  Location: [facility] ENDOSCOPY;  Service: Cardiovascular;  Laterality: N/A;    Family History  Problem Relation Age of Onset  . Diabetes Mother   . Multiple sclerosis Maternal Grandmother   . Heart disease Maternal Grandfather   . Cancer Paternal Grandmother        melanoma  . Heart disease Paternal Grandfather   . Healthy Father   . Thyroid disease Cousin     Social History   Socioeconomic History  . Marital status: Married    Spouse name: Not on file  . Number of children: 0  . Years of education: 52  . Highest education level: Not on file  Occupational History  . Occupation: Warden/ranger  Social Needs  . Financial resource strain: Not on file  . Food insecurity    Worry: Not on file    Inability: Not on file  . Transportation needs    Medical: Not on file    Non-medical: Not on file  Tobacco Use  . Smoking status: Never Smoker  . Smokeless tobacco: Never Used  Substance and Sexual Activity  . Alcohol use: Not Currently    Alcohol/week: 0.0 standard drinks  . Drug use: No  . Sexual activity: Yes    Birth control/protection: Other-see comments, None    Comment: husband vasectomy  Lifestyle  . Physical activity    Days per week: Not on file    Minutes per session: Not on file  . Stress: Not on file  Relationships  . Social Musician on phone: Not on file    Gets together: Not on file    Attends religious service: Not on file    Active member  of club or organization: Not on file    Attends meetings of clubs or organizations: Not on file    Relationship status: Not on file  . Intimate partner violence    Fear of current or ex partner: Not on file    Emotionally abused: Not on file    Physically abused: Not on file    Forced sexual activity: Not on file  Other Topics Concern  . Not on file  Social History Narrative   Fun: Garden, read   Denies religious beliefs effecting health care.     Current Outpatient Medications:  .  albuterol  (VENTOLIN HFA) 108 (90 Base) MCG/ACT inhaler, Inhale 2 puffs into the lungs every 6 (six) hours as needed for wheezing or shortness of breath., Disp: 8 g, Rfl: 0 .  b complex vitamins tablet, Take 1 tablet by mouth daily., Disp: , Rfl:  .  Bioflavonoid Products (C-COMPLEX PO), Take 1,000 mg by mouth., Disp: , Rfl:  .  Cholecalciferol (VITAMIN D-3 PO), Take 2,000 Units by mouth., Disp: , Rfl:  .  co-enzyme Q-10 30 MG capsule, Take 200 mg by mouth daily. , Disp: , Rfl:  .  levothyroxine (SYNTHROID, LEVOTHROID) 125 MCG tablet, Take 1 tablet (125 mcg total) by mouth daily., Disp: 90 tablet, Rfl: 2 .  liothyronine (CYTOMEL) 5 MCG tablet, Take one tablet daily, Disp: 90 tablet, Rfl: 2 .  losartan (COZAAR) 100 MG tablet, Take 100 mg by mouth daily. , Disp: , Rfl:  .  Omega-3 Fatty Acids (FISH OIL) 1360 MG CAPS, Take 1,600 mg by mouth., Disp: , Rfl:  .  pantoprazole (PROTONIX) 40 MG tablet, Take 1 tablet by mouth daily., Disp: , Rfl: 6 .  predniSONE (DELTASONE) 20 MG tablet, Take 1 tablet (20 mg total) by mouth daily with breakfast for 3 days., Disp: 3 tablet, Rfl: 0  EXAM:  VITALS per patient if applicable:Pulse (!) 23   Ht 5\' 8"  (1.727 m)   LMP 10/03/2008   BMI 34.36 kg/m   GENERAL: alert, oriented, appears well and in no acute distress  HEENT: atraumatic, conjunctiva clear, no obvious abnormalities on inspection. Nasal voice.  NECK: normal movements of the head and neck  LUNGS: on inspection no signs of respiratory distress, breathing rate appears normal, no obvious gross SOB, gasping or wheezing I do not appreciate wheezing with forced expiration.  CV: no obvious cyanosis  MS: moves all visible extremities without noticeable abnormality  PSYCH/NEURO: pleasant and cooperative, no obvious depression or anxiety, speech and thought processing grossly intact.  No neurologic deficit appreciated.  ASSESSMENT AND PLAN:  Discussed the following assessment and plan:  URI, acute Explained  that it is most likely viral, because he just started yesterday she needs to monitor for new symptoms. Symptomatic treatment recommended at this time, acetaminophen 500 mg 3-4 times per day, plenty of fluids, and rest. Given the current COVID19 pandemia,this needs to be considered. Continue monitor temp,social isolation/quarentine,and contact PCP's office this Monday to have COVID 19 screening arrange.  Wheezing - Plan: predniSONE (DELTASONE) 20 MG tablet, albuterol (VENTOLIN HFA) 108 (90 Base) MCG/ACT inhaler I do not appreciate any wheezing at this time. After discussing a few side effects of prednisone, recommend starting medication if wheezing becomes persistent, ideally in the morning with breakfast. Albuterol inh 2 puff every 6 hours for a week then as needed for wheezing or shortness of breath.   Headache, unspecified headache type Most likely related to viral illness. I do not think imaging  is needed today. Acetaminophen 500 mg 3-4 times per day recommended. She was clearly instructed about warning signs.   I discussed the assessment and treatment plan with the patient. She was provided an opportunity to ask questions and all were answered. She  agreed with the plan and demonstrated an understanding of the instructions.   The patient was advised to call back or seek an in-person evaluation if the symptoms worsen or if the condition fails to improve as anticipated.  Return if symptoms worsen or fail to improve.   Betty SwazilandJordan, MD

## 2019-02-17 ENCOUNTER — Telehealth: Payer: Self-pay | Admitting: Internal Medicine

## 2019-02-17 ENCOUNTER — Other Ambulatory Visit: Payer: Self-pay | Admitting: Endocrinology

## 2019-02-17 ENCOUNTER — Ambulatory Visit: Payer: Self-pay | Admitting: *Deleted

## 2019-02-17 NOTE — Telephone Encounter (Signed)
Ok to forward to provider with sat doxy visit as I was not involved

## 2019-02-17 NOTE — Telephone Encounter (Signed)
Patient spoke with [facility] on 9/12 at 8:06am.  States that a fever started the previous day.  States current temp was 99.2.  States that the day previous she heard crackling in her lungs and wheezing.  Reports body aches and headaches.  Denied SOB. Looks like pt had a Doxy Sat visit. Patient is currently scheduled for an upcoming visit on 9/22. Please advise.

## 2019-02-17 NOTE — Telephone Encounter (Signed)
Noted  

## 2019-02-17 NOTE — Telephone Encounter (Signed)
Pt called stating that she had a fever starting 02/14/2019 highest 100.4 and has been intermittent since then; it was 99.1 at 0730 on 02/16/2019; she complains of headache, body aches, fatigue and dry cough which started;.the pt says that she has an intermittent "catch" in her chest; body is the worst of her symptoms; the pt says that she took aspirin, the pt had a televisit  02/15/2019 and was prescribed prednisone and an inhaler, but she has not taken the medication yet; recommendations made per nurse triage protocol; she verbalized understanding and will take her medication and will call back if her symptoms do not improve; the pt sees Dr Cathlean Cower, [facility], will route to office for notification.  Reason for Disposition . Muscle aches and fever from a minor viral illness (e.g., common cold)  Answer Assessment - Initial Assessment Questions 1. ONSET: "When did the muscle aches or body pains start?"      02/14/2019 2. LOCATION: "What part of your body is hurting?" (e.g., entire body, arms, legs)      All over 3. SEVERITY: "How bad is the pain?" (Scale 1-10; or mild, moderate, severe)   - MILD (1-3): doesn't interfere with normal activities    - MODERATE (4-7): interferes with normal activities or awakens from sleep    - SEVERE (8-10):  excruciating pain, unable to do any normal activities       4. CAUSE: "What do you think is causing the pains?"     *No Answer* 5. FEVER: "Have you been having fever?"    Intermittent temp max 100.4  6. OTHER SYMPTOMS: "Do you have any other symptoms?" (e.g., chest pain, weakness, rash, cold or flu symptoms, weight loss)   Headache, chills 7. PREGNANCY: "Is there any chance you are pregnant?" "When was your last menstrual period?"     8. TRAVEL: "Have you traveled out of the country in the last month?" (e.g., travel history, exposures)     no  Protocols used: MUSCLE ACHES AND BODY PAIN-A-AH

## 2019-02-21 ENCOUNTER — Telehealth: Payer: Self-pay

## 2019-02-21 MED ORDER — PHENYLEPHRINE-GUAIFENESIN 5-100 MG/5ML PO LIQD: 5.0000 mL | Freq: Three times a day (TID) | ORAL | 0 refills | Status: DC | PRN

## 2019-02-21 MED ORDER — ZOSTER VAC RECOMB ADJUVANTED 50 MCG/0.5ML IM SUSR: 0.5000 mL | Freq: Once | INTRAMUSCULAR | 1 refills | Status: AC

## 2019-02-21 NOTE — Telephone Encounter (Signed)
Copied from [place] [id number]. Topic: General - Other >> Feb 21, 2019 10:57 AM Leward Quan A wrote: Reason for CRM: Pateint called to say that she is feeling a little better after taking medication prescribed by Dr [place] but her cough is still insistent. Also patient want to inquire of Dr Jenny Reichmann about the shingles vaccine discussed with her GYN Ph#  [phone] >> Feb 21, 2019 11:05 AM Para Skeans A wrote: Patient was seen on 9/12, Should we set up another virtual visit?

## 2019-02-21 NOTE — Addendum Note (Signed)
Addended by: Biagio Borg on: 02/21/2019 12:22 PM   Modules accepted: Orders

## 2019-02-21 NOTE — Telephone Encounter (Signed)
Ok I sent cough med, and shingles shot prescription to [facility] thanks

## 2019-02-24 ENCOUNTER — Telehealth: Payer: Self-pay | Admitting: Internal Medicine

## 2019-02-24 ENCOUNTER — Encounter: Payer: Self-pay | Admitting: Internal Medicine

## 2019-02-24 NOTE — Telephone Encounter (Signed)
Patient is calling to request a note to return back to work for the negative COVID test. Patient was advised to print the negative COVID test result. Patient states that her employer states that the need a note to return back to work. Please advise CB- [phone]

## 2019-02-24 NOTE — Telephone Encounter (Signed)
Note has been mailed.

## 2019-02-24 NOTE — Telephone Encounter (Signed)
Done erx 

## 2019-02-25 ENCOUNTER — Encounter: Payer: Managed Care, Other (non HMO) | Admitting: Internal Medicine

## 2019-02-25 ENCOUNTER — Encounter: Payer: Self-pay | Admitting: Internal Medicine

## 2019-02-27 NOTE — Telephone Encounter (Signed)
[place] for shirron to email (if possible) or fax the work note from Olivia Mendez 21, thanks

## 2019-03-01 ENCOUNTER — Ambulatory Visit: Payer: Managed Care, Other (non HMO)

## 2019-03-06 ENCOUNTER — Telehealth: Payer: Self-pay

## 2019-03-06 NOTE — Telephone Encounter (Signed)
Pt can be scheduled with PCP for a virtual.   Copied from [place] [id number]. Topic: General - Other >> Mar 06, 2019  9:09 AM Antonieta Iba C wrote: Reason for CRM: pt called in to schedule a ov with PCP. Pt says that she has a cough but would like to have a in office visit. Pt says that she has already had a VV with a different provider and feels that an in office would be better.  CB:[phone]

## 2019-03-07 ENCOUNTER — Other Ambulatory Visit: Payer: Self-pay | Admitting: Family Medicine

## 2019-03-07 DIAGNOSIS — R062 Wheezing: Secondary | ICD-10-CM

## 2019-03-07 NOTE — Telephone Encounter (Signed)
Left pt vm to call back to schedule.  Did offer vitural Sat clinic appt.

## 2019-03-10 NOTE — Telephone Encounter (Signed)
Noted  

## 2019-03-11 ENCOUNTER — Other Ambulatory Visit: Payer: Self-pay | Admitting: Endocrinology

## 2019-03-13 ENCOUNTER — Encounter: Payer: Managed Care, Other (non HMO) | Admitting: Internal Medicine

## 2019-03-14 ENCOUNTER — Encounter: Payer: Managed Care, Other (non HMO) | Admitting: Internal Medicine

## 2019-03-19 ENCOUNTER — Encounter: Payer: Managed Care, Other (non HMO) | Admitting: Internal Medicine

## 2019-03-19 ENCOUNTER — Other Ambulatory Visit: Payer: Self-pay

## 2019-03-19 DIAGNOSIS — Z20822 Contact with and (suspected) exposure to covid-19: Secondary | ICD-10-CM

## 2019-03-20 LAB — NOVEL CORONAVIRUS, NAA: SARS-CoV-2, NAA: NOT DETECTED

## 2019-04-08 ENCOUNTER — Other Ambulatory Visit: Payer: Self-pay | Admitting: Cardiology

## 2019-04-08 IMAGING — RF DG ESOPHAGUS
5 series · 14 of 21 positions shown · non-contrast
Comparison: CT chest of 02/09/2017

CLINICAL DATA: Dysphagia, fullness in the neck

EXAM:
ESOPHOGRAM / BARIUM SWALLOW / BARIUM TABLET STUDY
TECHNIQUE: Combined double contrast and single contrast examination performed
using effervescent crystals, thick barium liquid, and thin barium
liquid. The patient was observed with fluoroscopy swallowing a 13 mm
barium sulphate tablet.
FLUOROSCOPY TIME:  Fluoroscopy Time:  1 minutes 36 second
Radiation Exposure Index (if provided by the fluoroscopic device):
87 mGy
Number of Acquired Spot Images: 0

[Series 1: sequence · 3 of 9 frames shown (1 of 4)]
[frame 2/9]
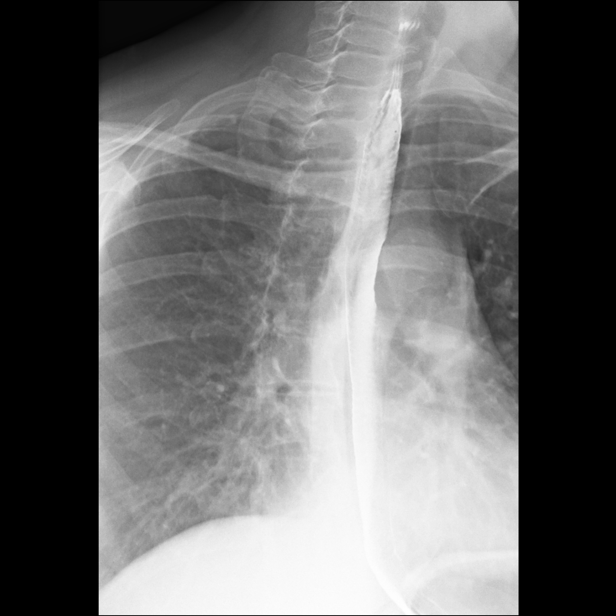
[frame 7/9]
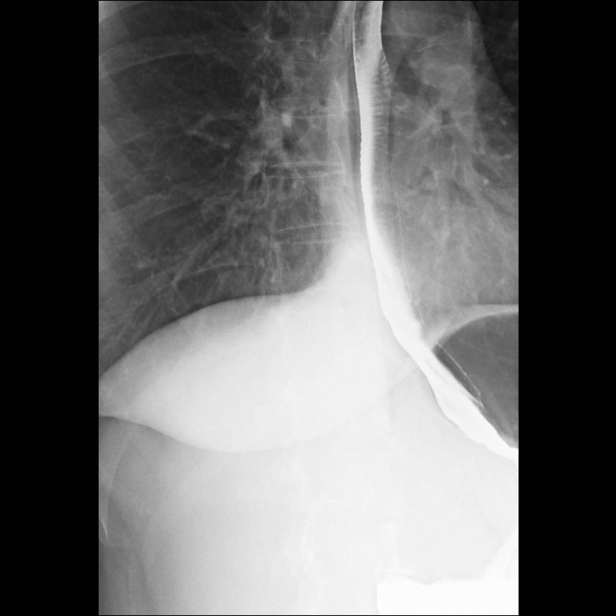
[frame 8/9]
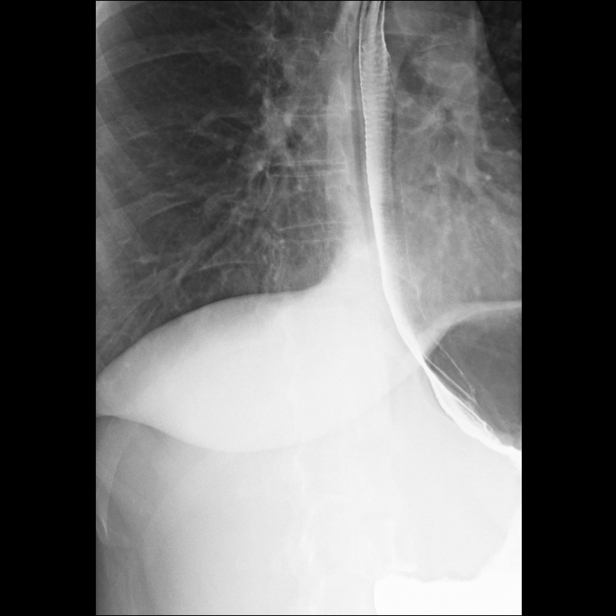

[Series 2: sequence · 2 of 25 frames shown (2 of 4)]
[frame 7/25]
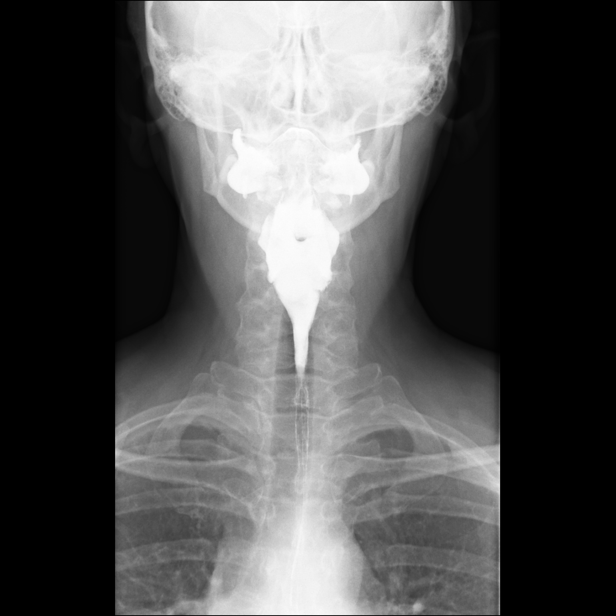
[frame 13/25]
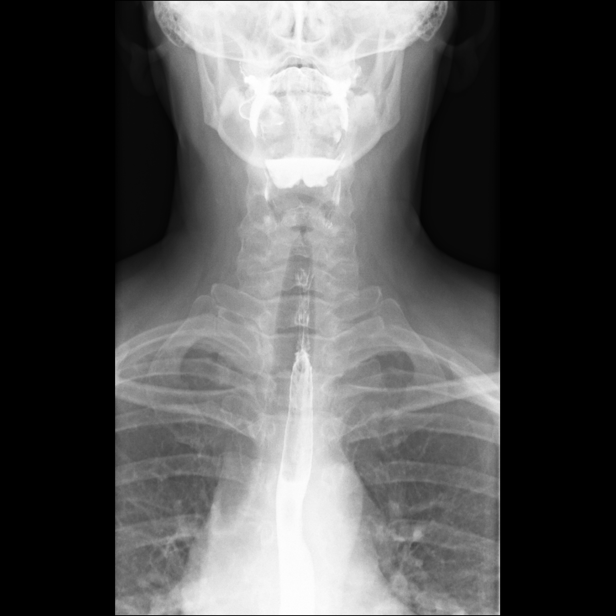

[Series 3: sequence · 2 of 14 frames shown (3 of 4)]
[frame 3/14]
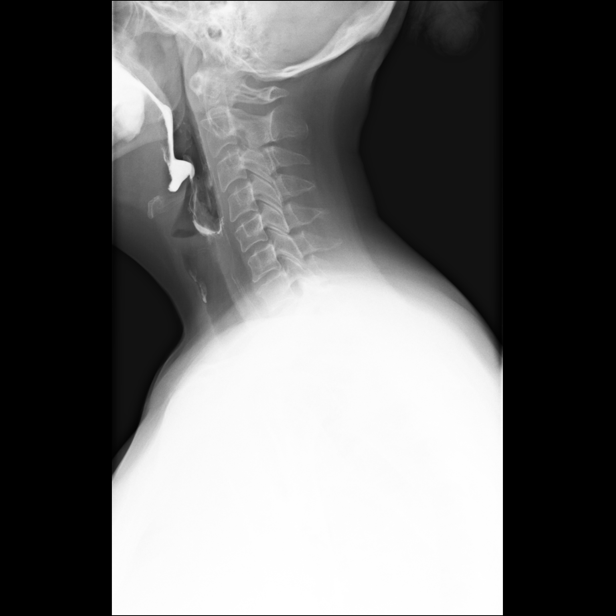
[frame 8/14]
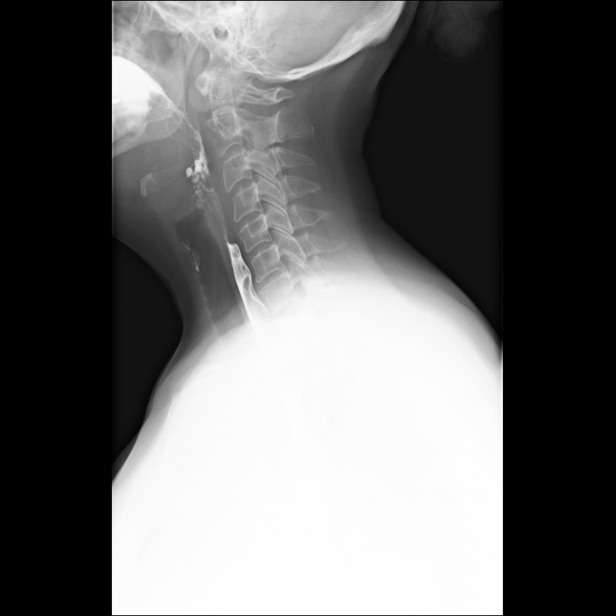

[Series 4: sequence · 3 of 52 frames shown (4 of 4)]
[frame 8/52]
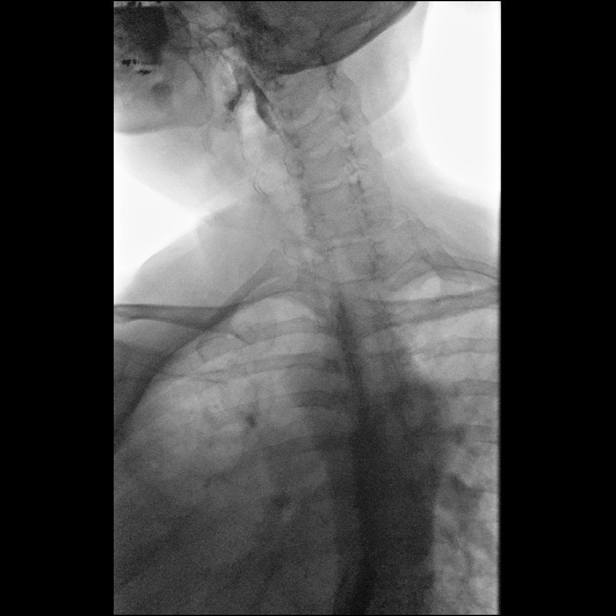
[frame 27/52]
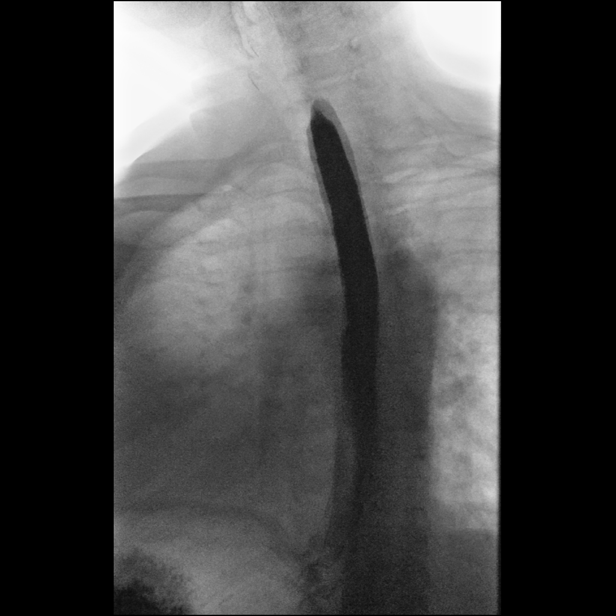
[frame 45/52]
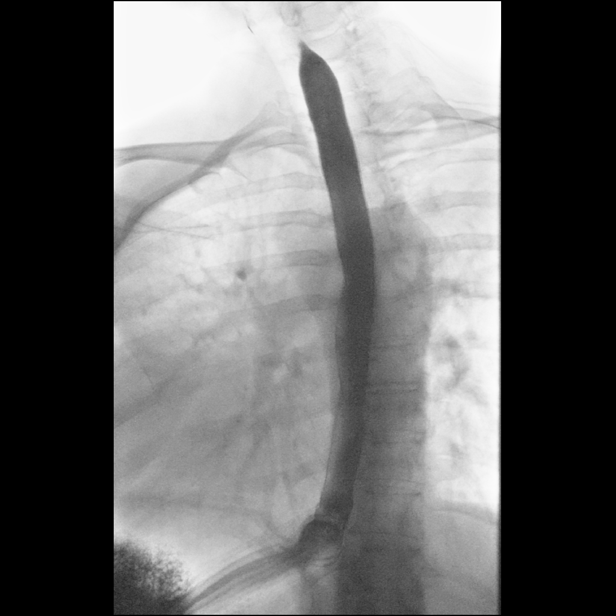

[Series 5: one shot · 4 of 6 slices shown]
[im 1/6]
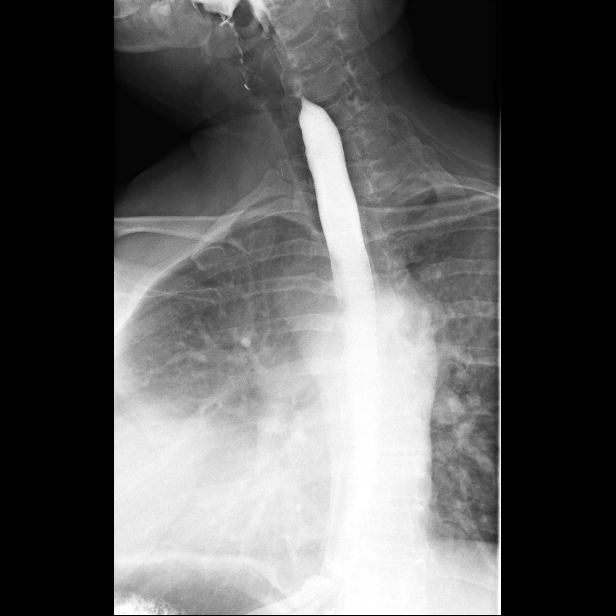
[im 3/6]
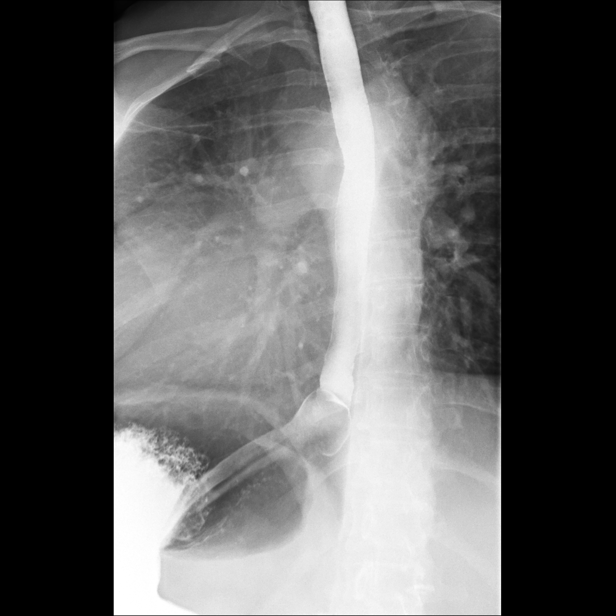
[im 4/6]
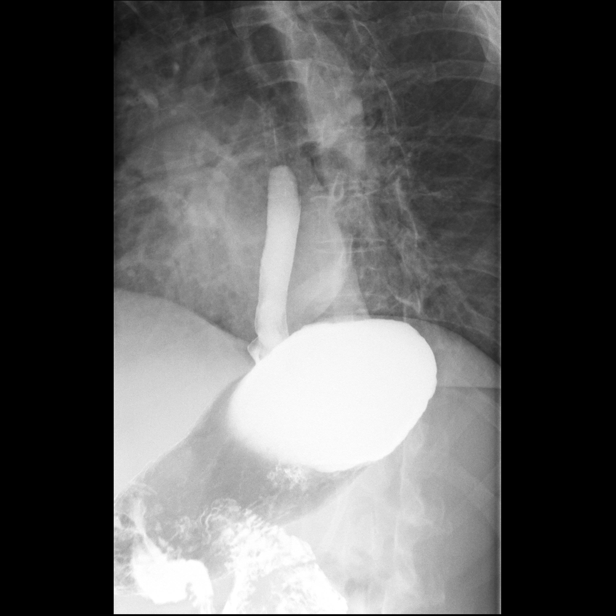
[im 6/6]
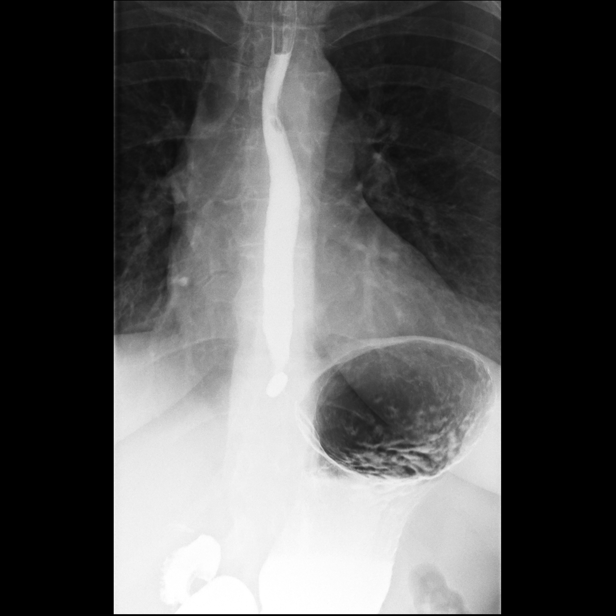

[14 of 21 positions shown; findings below may reference images not displayed]

FINDINGS: Initially double-contrast barium swallow was performed. The mucosa
of the esophagus is unremarkable. Single contrast study shows the
swallowing mechanism to be normal. No intrinsic or extrinsic
abnormality is evident within the neck. However, on the lateral
rapid sequence films there may be some contrast within the upper
airway suggesting very minimal aspiration without cough response.
Esophageal peristalsis appears normal. There is a small sliding
hiatal hernia present. Moderate gastroesophageal reflux is
demonstrated. A barium pill was given at the end of the study. The
pill did lodge just above the hiatal hernia and remained there after
additional water and barium were given. This indicates a short
segment distal esophageal stricture.
IMPRESSION: 1. Small sliding hiatal hernia with moderate gastroesophageal
reflux.
2. Barium pill lodges just above the hiatal hernia indicating a
short segment distal esophageal stricture.
3. Faint aspiration of barium without cough response.

## 2019-04-09 ENCOUNTER — Other Ambulatory Visit (INDEPENDENT_AMBULATORY_CARE_PROVIDER_SITE_OTHER): Payer: Managed Care, Other (non HMO)

## 2019-04-09 ENCOUNTER — Other Ambulatory Visit: Payer: Self-pay | Admitting: Internal Medicine

## 2019-04-09 ENCOUNTER — Encounter: Payer: Self-pay | Admitting: Internal Medicine

## 2019-04-09 ENCOUNTER — Ambulatory Visit (INDEPENDENT_AMBULATORY_CARE_PROVIDER_SITE_OTHER): Payer: Managed Care, Other (non HMO) | Admitting: Internal Medicine

## 2019-04-09 ENCOUNTER — Other Ambulatory Visit: Payer: Self-pay

## 2019-04-09 VITALS — BP 114/68 | HR 61 | Temp 97.8°F | Ht 68.0 in | Wt 225.0 lb

## 2019-04-09 DIAGNOSIS — E559 Vitamin D deficiency, unspecified: Secondary | ICD-10-CM

## 2019-04-09 DIAGNOSIS — Z0000 Encounter for general adult medical examination without abnormal findings: Secondary | ICD-10-CM | POA: Diagnosis not present

## 2019-04-09 DIAGNOSIS — E611 Iron deficiency: Secondary | ICD-10-CM

## 2019-04-09 DIAGNOSIS — I712 Thoracic aortic aneurysm, without rupture: Secondary | ICD-10-CM

## 2019-04-09 DIAGNOSIS — Z114 Encounter for screening for human immunodeficiency virus [HIV]: Secondary | ICD-10-CM

## 2019-04-09 DIAGNOSIS — E538 Deficiency of other specified B group vitamins: Secondary | ICD-10-CM

## 2019-04-09 DIAGNOSIS — Z0001 Encounter for general adult medical examination with abnormal findings: Secondary | ICD-10-CM

## 2019-04-09 DIAGNOSIS — J309 Allergic rhinitis, unspecified: Secondary | ICD-10-CM

## 2019-04-09 DIAGNOSIS — F411 Generalized anxiety disorder: Secondary | ICD-10-CM

## 2019-04-09 DIAGNOSIS — E039 Hypothyroidism, unspecified: Secondary | ICD-10-CM

## 2019-04-09 DIAGNOSIS — I7121 Aneurysm of the ascending aorta, without rupture: Secondary | ICD-10-CM

## 2019-04-09 LAB — URINALYSIS, ROUTINE W REFLEX MICROSCOPIC
Bilirubin Urine: NEGATIVE
Hgb urine dipstick: NEGATIVE
Ketones, ur: 15 — AB
Nitrite: NEGATIVE
Specific Gravity, Urine: >=1.03 — AB (ref 1.000–1.030)
Total Protein, Urine: NEGATIVE
Urine Glucose: NEGATIVE
Urobilinogen, UA: 0.2 (ref 0.0–1.0)
pH: 5.5 (ref 5.0–8.0)

## 2019-04-09 LAB — CBC WITH DIFFERENTIAL/PLATELET
Basophils Absolute: 0.1 10*3/uL (ref 0.0–0.1)
Basophils Relative: 1.2 % (ref 0.0–3.0)
Eosinophils Absolute: 0.6 10*3/uL (ref 0.0–0.7)
Eosinophils Relative: 8.3 % — ABNORMAL HIGH (ref 0.0–5.0)
HCT: 40.8 % (ref 36.0–46.0)
Hemoglobin: 13.7 g/dL (ref 12.0–15.0)
Lymphocytes Relative: 34.5 % (ref 12.0–46.0)
Lymphs Abs: 2.4 10*3/uL (ref 0.7–4.0)
MCHC: 33.5 g/dL (ref 30.0–36.0)
MCV: 96.1 fl (ref 78.0–100.0)
Monocytes Absolute: 0.5 10*3/uL (ref 0.1–1.0)
Monocytes Relative: 6.6 % (ref 3.0–12.0)
Neutro Abs: 3.5 10*3/uL (ref 1.4–7.7)
Neutrophils Relative %: 49.4 % (ref 43.0–77.0)
Platelets: 286 10*3/uL (ref 150.0–400.0)
RBC: 4.24 Mil/uL (ref 3.87–5.11)
RDW: 13.3 % (ref 11.5–15.5)
WBC: 7.1 10*3/uL (ref 4.0–10.5)

## 2019-04-09 LAB — IBC PANEL
Iron: 92 ug/dL (ref 42–145)
Saturation Ratios: 20 % (ref 20.0–50.0)
Transferrin: 329 mg/dL (ref 212.0–360.0)

## 2019-04-09 LAB — BASIC METABOLIC PANEL
BUN: 14 mg/dL (ref 6–23)
CO2: 28 mEq/L (ref 19–32)
Calcium: 9.8 mg/dL (ref 8.4–10.5)
Chloride: 103 mEq/L (ref 96–112)
Creatinine, Ser: 0.81 mg/dL (ref 0.40–1.20)
GFR: 73.48 mL/min (ref >60.00)
Glucose, Bld: 88 mg/dL (ref 70–99)
Potassium: 4.5 mEq/L (ref 3.5–5.1)
Sodium: 139 mEq/L (ref 135–145)

## 2019-04-09 LAB — VITAMIN B12: Vitamin B-12: 233 pg/mL (ref 211–911)

## 2019-04-09 LAB — HEPATIC FUNCTION PANEL
ALT: 24 U/L (ref 0–35)
AST: 17 U/L (ref 0–37)
Albumin: 4.4 g/dL (ref 3.5–5.2)
Alkaline Phosphatase: 71 U/L (ref 39–117)
Bilirubin, Direct: 0.2 mg/dL (ref 0.0–0.3)
Total Bilirubin: 1.1 mg/dL (ref 0.2–1.2)
Total Protein: 7.1 g/dL (ref 6.0–8.3)

## 2019-04-09 LAB — VITAMIN D 25 HYDROXY (VIT D DEFICIENCY, FRACTURES): VITD: 20.71 ng/mL — ABNORMAL LOW (ref 30.00–100.00)

## 2019-04-09 LAB — T4, FREE: Free T4: 1.06 ng/dL (ref 0.60–1.60)

## 2019-04-09 LAB — LIPID PANEL
Cholesterol: 208 mg/dL — ABNORMAL HIGH (ref 0–200)
HDL: 60.5 mg/dL (ref >39.00)
LDL Cholesterol: 134 mg/dL — ABNORMAL HIGH (ref 0–99)
NonHDL: 147.45
Total CHOL/HDL Ratio: 3
Triglycerides: 68 mg/dL (ref 0.0–149.0)
VLDL: 13.6 mg/dL (ref 0.0–40.0)

## 2019-04-09 LAB — TSH: TSH: 25.83 u[IU]/mL — ABNORMAL HIGH (ref 0.35–4.50)

## 2019-04-09 MED ORDER — VITAMIN D (ERGOCALCIFEROL) 1.25 MG (50000 UNIT) PO CAPS: 50000.0000 [IU] | ORAL_CAPSULE | ORAL | 0 refills | Status: DC

## 2019-04-09 MED ORDER — LIOTHYRONINE SODIUM 5 MCG PO TABS: ORAL_TABLET | ORAL | 0 refills | Status: DC

## 2019-04-09 NOTE — Addendum Note (Signed)
Addended by: Biagio Borg on: 04/09/2019 08:46 PM   Modules accepted: Orders

## 2019-04-09 NOTE — Assessment & Plan Note (Signed)
stable overall by history and exam, recent data reviewed with pt, and pt to continue medical treatment as before,  to f/u any worsening symptoms or concerns  

## 2019-04-09 NOTE — Assessment & Plan Note (Signed)

## 2019-04-09 NOTE — Progress Notes (Signed)
Subjective:    Patient ID: Olivia Mendez, female    DOB: 1965/07/23, 53 y.o.   MRN: [id number]  HPI  Here for wellness and f/u;  Overall doing ok;  Pt denies Chest pain, worsening SOB, DOE, wheezing, orthopnea, PND, worsening LE edema, palpitations, dizziness or syncope.  Pt denies neurological change such as new headache, facial or extremity weakness.  Pt denies polydipsia, polyuria, or low sugar symptoms. Pt states overall good compliance with treatment and medications, good tolerability, and has been trying to follow appropriate diet.  Pt denies worsening depressive symptoms, suicidal ideation or panic. No fever, night sweats, wt loss, loss of appetite, or other constitutional symptoms.  Pt states good ability with ADL's, has low fall risk, home safety reviewed and adequate, no other significant changes in hearing or vision, and only occasionally active with exercise. Had 3 close relatives died including her mother this yr, so grief has been an issue.  Does have several wks ongoing nasal allergy symptoms with clearish congestion, itch and sneezing, without fever, pain, ST, cough, swelling or wheezing.  Denies hyper or hypo thyroid symptoms such as voice, skin or hair change. Past Medical History:  Diagnosis Date  . Allergic rhinitis 12/26/2014  . Aortic aneurysm (HCC) 2018  . Endometriosis 1990's  . Hypothyroid   . Migraines    Past Surgical History:  Procedure Laterality Date  . APPENDECTOMY    . CHOLECYSTECTOMY    . OTHER SURGICAL HISTORY     surgery for Endometriosis  . TEE WITHOUT CARDIOVERSION N/A 03/22/2017   Procedure: TRANSESOPHAGEAL ECHOCARDIOGRAM (TEE);  Surgeon: Yates Decamp, MD;  Location: [facility] ENDOSCOPY;  Service: Cardiovascular;  Laterality: N/A;    reports that she has never smoked. She has never used smokeless tobacco. She reports previous alcohol use. She reports that she does not use drugs. family history includes Cancer in her paternal grandmother; Diabetes in her mother;  Healthy in her father; Heart disease in her maternal grandfather and paternal grandfather; Multiple sclerosis in her maternal grandmother; Thyroid disease in her cousin. Allergies  Allergen Reactions  . Vicodin [Hydrocodone-Acetaminophen] Nausea And Vomiting   Current Outpatient Medications on File Prior to Visit  Medication Sig Dispense Refill  . b complex vitamins tablet Take 1 tablet by mouth daily.    [name] Bioflavonoid Products (C-COMPLEX PO) Take 1,000 mg by mouth.    . Cholecalciferol (VITAMIN D-3 PO) Take 2,000 Units by mouth.    . co-enzyme Q-10 30 MG capsule Take 200 mg by mouth daily.     [name] levothyroxine (SYNTHROID, LEVOTHROID) 125 MCG tablet Take 1 tablet (125 mcg total) by mouth daily. 90 tablet 2  . losartan (COZAAR) 100 MG tablet TAKE 1 TABLET DAILY 90 tablet 3  . Omega-3 Fatty Acids (FISH OIL) 1360 MG CAPS Take 1,600 mg by mouth.    . pantoprazole (PROTONIX) 40 MG tablet Take 1 tablet by mouth daily.  6  . Phenylephrine-guaiFENesin 5-100 MG/5ML LIQD Take 5 mLs by mouth 3 (three) times daily as needed. 237 mL 0  . albuterol (VENTOLIN HFA) 108 (90 Base) MCG/ACT inhaler Inhale 2 puffs into the lungs every 6 (six) hours as needed for wheezing or shortness of breath. 8 g 0   No current facility-administered medications on file prior to visit.    Review of Systems Constitutional: Negative for other unusual diaphoresis, sweats, appetite or weight changes HENT: Negative for other worsening hearing loss, ear pain, facial swelling, mouth sores or neck stiffness.   Eyes: Negative for  other worsening pain, redness or other visual disturbance.  Respiratory: Negative for other stridor or swelling Cardiovascular: Negative for other palpitations or other chest pain  Gastrointestinal: Negative for worsening diarrhea or loose stools, blood in stool, distention or other pain Genitourinary: Negative for hematuria, flank pain or other change in urine volume.  Musculoskeletal: Negative for myalgias  or other joint swelling.  Skin: Negative for other color change, or other wound or worsening drainage.  Neurological: Negative for other syncope or numbness. Hematological: Negative for other adenopathy or swelling Psychiatric/Behavioral: Negative for hallucinations, other worsening agitation, SI, self-injury, or new decreased concentration All otherwise neg per pt     Objective:   Physical Exam BP 114/68   Pulse 61   Temp 97.8 F (36.6 C) (Oral)   Ht 5\' 8"  (1.727 m)   Wt 225 lb (102.1 kg)   LMP 10/03/2008   SpO2 99%   BMI 34.21 kg/m  VS noted,  Constitutional: Pt is oriented to person, place, and time. Appears well-developed and well-nourished, in no significant distress and comfortable Head: Normocephalic and atraumatic  Eyes: Conjunctivae and EOM are normal. Pupils are equal, round, and reactive to light Bilat tm's with mild erythema.  Max sinus areas non tender.  Pharynx with mild erythema, no exudate Right Ear: External ear normal without discharge Left Ear: External ear normal without discharge Nose: Nose without discharge or deformity Mouth/Throat: Oropharynx is without other ulcerations and moist  Neck: Normal range of motion. Neck supple. No JVD present. No tracheal deviation present or significant neck LA or mass Cardiovascular: Normal rate, regular rhythm, normal heart sounds and intact distal pulses.   Pulmonary/Chest: WOB normal and breath sounds without rales or wheezing  Abdominal: Soft. Bowel sounds are normal. NT. No HSM  Musculoskeletal: Normal range of motion. Exhibits no edema Lymphadenopathy: Has no other cervical adenopathy.  Neurological: Pt is alert and oriented to person, place, and time. Pt has normal reflexes. No cranial nerve deficit. Motor grossly intact, Gait intact Skin: Skin is warm and dry. No rash noted or new ulcerations Psychiatric:  Has normal mood and affect. Behavior is normal without agitation All otherwise neg per pt  Lab Results   Component Value Date   WBC 7.1 04/09/2019   HGB 13.7 04/09/2019   HCT 40.8 04/09/2019   PLT 286.0 04/09/2019   GLUCOSE 88 04/09/2019   CHOL 208 (H) 04/09/2019   TRIG 68.0 04/09/2019   HDL 60.50 04/09/2019   LDLCALC 134 (H) 04/09/2019   ALT 24 04/09/2019   AST 17 04/09/2019   NA 139 04/09/2019   K 4.5 04/09/2019   CL 103 04/09/2019   CREATININE 0.81 04/09/2019   BUN 14 04/09/2019   CO2 28 04/09/2019   TSH 25.83 (H) 04/09/2019   INR 0.9 ratio 09/14/2008   HGBA1C 5.5 11/13/2014         Assessment & Plan:

## 2019-04-09 NOTE — Assessment & Plan Note (Addendum)
Mild to mod, for OTC zyrtec and nasacort asd,  to f/u any worsening symptoms or concerns  In addition to the time spent performing CPE, I spent an additional 25 minutes face to face,in which greater than 50% of this time was spent in counseling and coordination of care for patient's acute illness as documented, including the differential dx, treatment, further evaluation and other management of allergic rhinitis, anxiety, Hypothryoidism, thoracic ascending aortic aneyurysm

## 2019-04-09 NOTE — Assessment & Plan Note (Signed)
For f/u CTA as planned 2021

## 2019-04-09 NOTE — Patient Instructions (Signed)
.  Please continue all other medications as before, and refills have been done if requested.  Please have the pharmacy call with any other refills you may need.  Please continue your efforts at being more active, low cholesterol diet, and weight control.  You are otherwise up to date with prevention measures today.  Please keep your appointments with your specialists as you may have planned  You will be contacted regarding the referral for: Dr Dwyane Dee  Please go to the LAB in the Basement (turn left off the elevator) for the tests to be done today  You will be contacted by phone if any changes need to be made immediately.  Otherwise, you will receive a letter about your results with an explanation, but please check with MyChart first.  Please remember to sign up for MyChart if you have not done so, as this will be important to you in the future with finding out test results, communicating by private email, and scheduling acute appointments online when needed.  Please return in 1 year for your yearly visit, or sooner if needed, with Lab testing done 3-5 days before

## 2019-04-09 NOTE — Assessment & Plan Note (Signed)
For tfts, f/u endo as planned

## 2019-04-10 LAB — HIV ANTIBODY (ROUTINE TESTING W REFLEX): HIV 1&2 Ab, 4th Generation: NONREACTIVE

## 2019-04-13 ENCOUNTER — Other Ambulatory Visit: Payer: Self-pay | Admitting: Internal Medicine

## 2019-04-16 ENCOUNTER — Other Ambulatory Visit: Payer: Self-pay

## 2019-04-16 NOTE — Progress Notes (Deleted)
Patient ID: Olivia Mendez, female   DOB: 1965-10-22, 53 y.o.   MRN: [id number]           Referring Provider:  Reason for Appointment:  Hypothyroidism, new visit    History of Present Illness:   Hypothyroidism was first diagnosed in   At the time of diagnosis patient was having symptoms of  fatigue, cold sensitivity, difficulty concentrating, dry skin, weight gain and hair loss .           The patient has been treated with    With starting thyroid supplementation the patient's symptoms  The patient takes the thyroid supplement before breakfast         Patient's weight history is as follows:  Wt Readings from Last 3 Encounters:  04/09/19 225 lb (102.1 kg)  01/07/19 226 lb (102.5 kg)  11/08/18 218 lb (98.9 kg)    Thyroid function results have been as follows:  Lab Results  Component Value Date   TSH 25.83 (H) 04/09/2019   TSH 3.68 02/21/2018   TSH 3.72 02/01/2018   TSH 2.12 11/13/2014   FREET4 1.06 04/09/2019   FREET4 0.98 02/01/2018   T3FREE 3.4 02/01/2018     Past Medical History:  Diagnosis Date  . Allergic rhinitis 12/26/2014  . Aortic aneurysm (HCC) 2018  . Endometriosis 1990's  . Hypothyroid   . Migraines     Past Surgical History:  Procedure Laterality Date  . APPENDECTOMY    . CHOLECYSTECTOMY    . OTHER SURGICAL HISTORY     surgery for Endometriosis  . TEE WITHOUT CARDIOVERSION N/A 03/22/2017   Procedure: TRANSESOPHAGEAL ECHOCARDIOGRAM (TEE);  Surgeon: Yates Decamp, MD;  Location: [facility] ENDOSCOPY;  Service: Cardiovascular;  Laterality: N/A;    Family History  Problem Relation Age of Onset  . Diabetes Mother   . Multiple sclerosis Maternal Grandmother   . Heart disease Maternal Grandfather   . Cancer Paternal Grandmother        melanoma  . Heart disease Paternal Grandfather   . Healthy Father   . Thyroid disease Cousin     Social History:  reports that she has never smoked. She has never used smokeless tobacco. She reports previous alcohol use.  She reports that she does not use drugs.  Allergies:  Allergies  Allergen Reactions  . Vicodin [Hydrocodone-Acetaminophen] Nausea And Vomiting    Allergies as of 04/17/2019      Reactions   Vicodin [hydrocodone-acetaminophen] Nausea And Vomiting      Medication List       Accurate as of April 16, 2019  9:13 PM. If you have any questions, ask your nurse or doctor.        albuterol 108 (90 Base) MCG/ACT inhaler Commonly known as: VENTOLIN HFA Inhale 2 puffs into the lungs every 6 (six) hours as needed for wheezing or shortness of breath.   b complex vitamins tablet Take 1 tablet by mouth daily.   C-COMPLEX PO Take 1,000 mg by mouth.   co-enzyme Q-10 30 MG capsule Take 200 mg by mouth daily.   Fish Oil 1360 MG Caps Take 1,600 mg by mouth.   levothyroxine 125 MCG tablet Commonly known as: SYNTHROID TAKE 1 TABLET DAILY   liothyronine 5 MCG tablet Commonly known as: CYTOMEL Take one tablet daily   losartan 100 MG tablet Commonly known as: COZAAR TAKE 1 TABLET DAILY   pantoprazole 40 MG tablet Commonly known as: PROTONIX Take 1 tablet by mouth daily.   Phenylephrine-guaiFENesin 5-100 MG/5ML  Liqd Take 5 mLs by mouth 3 (three) times daily as needed.   Vitamin D (Ergocalciferol) 1.25 MG (50000 UT) Caps capsule Commonly known as: DRISDOL Take 1 capsule (50,000 Units total) by mouth every 7 (seven) days.   VITAMIN D-3 PO Take 2,000 Units by mouth.          Review of Systems              Examination:    LMP 10/03/2008   GENERAL:  Average build.   No pallor.    Skin:  no rash or significant skin lesions.  EYES:  No prominence of the eyes or swelling of the eyelids  ENT: Oral mucosa and tongue normal.  NECK: No lymphadenopathy  THYROID:  Not palpable.  HEART:  Normal  S1 and S2; no murmur or click.  CHEST:    Lungs: Vescicular breath sounds heard equally.  No crepitations/ wheeze.  ABDOMEN:  No distention.  Liver and spleen not  palpable.  No other mass or tenderness.  NEUROLOGICAL: Reflexes are bilaterally at biceps.  EXTREMITIES:  Normal peripheral joints.  No ankle edema present   Assessment:  HYPOTHYROIDISM  PLAN:    Follow-up   Elayne Snare 04/16/2019, 9:13 PM   Consultation note copy sent to the PCP  Note: This office note was prepared with Dragon voice recognition system technology. Any transcriptional errors that result from this process are unintentional.

## 2019-04-17 ENCOUNTER — Ambulatory Visit: Payer: Managed Care, Other (non HMO) | Admitting: Endocrinology

## 2019-05-13 ENCOUNTER — Ambulatory Visit: Payer: Managed Care, Other (non HMO) | Admitting: Endocrinology

## 2019-05-13 ENCOUNTER — Encounter: Payer: Self-pay | Admitting: Endocrinology

## 2019-05-13 VITALS — BP 128/72 | HR 65 | Ht 68.0 in | Wt 226.4 lb

## 2019-05-13 DIAGNOSIS — E063 Autoimmune thyroiditis: Secondary | ICD-10-CM

## 2019-05-13 DIAGNOSIS — E538 Deficiency of other specified B group vitamins: Secondary | ICD-10-CM | POA: Diagnosis not present

## 2019-05-13 LAB — TSH: TSH: 22.34 u[IU]/mL — ABNORMAL HIGH (ref 0.35–4.50)

## 2019-05-13 LAB — T4, FREE: Free T4: 0.87 ng/dL (ref 0.60–1.60)

## 2019-05-13 LAB — T3, FREE: T3, Free: 3.1 pg/mL (ref 2.3–4.2)

## 2019-05-13 MED ORDER — LEVOTHYROXINE SODIUM 150 MCG PO TABS: 150.0000 ug | ORAL_TABLET | Freq: Every day | ORAL | 1 refills | Status: DC

## 2019-05-13 NOTE — Progress Notes (Signed)
Patient ID: Olivia Mendez, female   DOB: 12/24/1965, 53 y.o.   MRN: [id number]           Referring Physician: Oliver BarreJames John  Reason for Appointment:  Hypothyroidism, follow-up visit    History of Present Illness:   Hypothyroidism was first diagnosed in 1994  Baseline consultation history: At the time of diagnosis patient was having symptoms of fatigue and hair loss.  Also had noticed a swelling in her neck and was found to have a goiter She does not think she had symptoms of, cold sensitivity, difficulty concentrating, dry skin, weight gain  She has been treated by various physicians over the last several years About 5 years or so ago she thinks she was continuing to have fatigue  and when seen by a new endocrinologist she was suggested trying levothyroxine and liothyronine separately However she is not clear whether she started feeling better with this, she may have felt a little less tired        The patient has been treated with either levothyroxine or a combination of levothyroxine and Cytomel Her dose was reduced to 125 mcg levothyroxine instead of 150 last year but she has not had a follow-up since then    With starting thyroid supplementation the patient's symptoms apparently did not improve  RECENT history: On her initial consultation in 8/19 she was complaining of fatigue but also felt that this may be related to her busy lifestyle and multiple responsibilities She has had chronic cold intolerance  She has been referred back by her PCP because of abnormal thyroid levels in November She feels generally tired but she does not think that this is any worse recently She did not refill her liothyronine prescription prior to her labs in November for about 2 weeks but did not feel any worse during this period  No recent weight gain or hair loss  She has been consistent with taking her levothyroxine when she wakes up along with her liothyronine recently Does not take any calcium or  iron containing vitamins at the same time and takes her Protonix at bedtime         Patient's weight history is as follows:  Wt Readings from Last 3 Encounters:  05/13/19 226 lb 6.4 oz (102.7 kg)  04/09/19 225 lb (102.1 kg)  01/07/19 226 lb (102.5 kg)    Thyroid function results have been as follows:  No recent labs available from previous endocrinologist  Lab Results  Component Value Date   TSH 25.83 (H) 04/09/2019   TSH 3.68 02/21/2018   TSH 3.72 02/01/2018   TSH 2.12 11/13/2014   FREET4 1.06 04/09/2019   FREET4 0.98 02/01/2018   T3FREE 3.4 02/01/2018     Past Medical History:  Diagnosis Date  . Allergic rhinitis 12/26/2014  . Aortic aneurysm (HCC) 2018  . Endometriosis 1990's  . Hypothyroid   . Migraines     Past Surgical History:  Procedure Laterality Date  . APPENDECTOMY    . CHOLECYSTECTOMY    . OTHER SURGICAL HISTORY     surgery for Endometriosis  . TEE WITHOUT CARDIOVERSION N/A 03/22/2017   Procedure: TRANSESOPHAGEAL ECHOCARDIOGRAM (TEE);  Surgeon: Yates DecampGanji, Jay, MD;  [facility];  Service: Cardiovascular;  Laterality: N/A;    Family History  Problem Relation Age of Onset  . Diabetes Mother   . Multiple sclerosis Maternal Grandmother   . Heart disease Maternal Grandfather   . Cancer Paternal Grandmother        melanoma  .  Heart disease Paternal Grandfather   . Healthy Father   . Thyroid disease Cousin     Social History:  reports that she has never smoked. She has never used smokeless tobacco. She reports previous alcohol use. She reports that she does not use drugs.  Allergies:  Allergies  Allergen Reactions  . Vicodin [Hydrocodone-Acetaminophen] Nausea And Vomiting    Allergies as of 05/13/2019      Reactions   Vicodin [hydrocodone-acetaminophen] Nausea And Vomiting      Medication List       Accurate as of May 13, 2019  2:14 PM. If you have any questions, ask your nurse or doctor.        albuterol 108 (90 Base) MCG/ACT  inhaler Commonly known as: VENTOLIN HFA Inhale 2 puffs into the lungs every 6 (six) hours as needed for wheezing or shortness of breath.   b complex vitamins tablet Take 1 tablet by mouth daily.   C-COMPLEX PO Take 1,000 mg by mouth.   co-enzyme Q-10 30 MG capsule Take 200 mg by mouth daily.   Fish Oil 1360 MG Caps Take 1,600 mg by mouth.   levothyroxine 125 MCG tablet Commonly known as: SYNTHROID TAKE 1 TABLET DAILY   liothyronine 5 MCG tablet Commonly known as: CYTOMEL Take one tablet daily   losartan 100 MG tablet Commonly known as: COZAAR TAKE 1 TABLET DAILY   pantoprazole 40 MG tablet Commonly known as: PROTONIX Take 1 tablet by mouth daily.   Phenylephrine-guaiFENesin 5-100 MG/5ML Liqd Take 5 mLs by mouth 3 (three) times daily as needed.   Vitamin D (Ergocalciferol) 1.25 MG (50000 UT) Caps capsule Commonly known as: DRISDOL Take 1 capsule (50,000 Units total) by mouth every 7 (seven) days.   VITAMIN D-3 PO Take 2,000 Units by mouth.         Review of Systems   She has had chronic reflux and takes her Protonix at bedtime      She has not been taking any B12 supplement, previously was taking B complex vitamins Her B12 level is low normal  She is under good control of her hypertension with losartan        Examination:    BP 128/72 (BP Location: Left Arm, Patient Position: Sitting, Cuff Size: Large)   Pulse 65   Ht 5\' 8"  (1.727 m)   Wt 226 lb 6.4 oz (102.7 kg)   LMP 10/03/2008   SpO2 98%   BMI 34.42 kg/m   She looks well  THYROID:  Not palpable.  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  She has been on a combination of levothyroxine and 5 mcg of liothyronine Although last year her thyroid levels were fairly good she has had tendency to feeling tired and this has not been explained by hypothyroidism usually  More recently her high TSH is likely to be from her  not refilling her liothyronine for 2 weeks prior to her labs  Vitamin B12 deficiency: She likely has mild deficiency with low normal levels compared to 4 years ago  PLAN:   Check thyroid levels including T3 level today Will make adjustments in her regimen as needed She will continue to take her supplements together in the morning and avoid any interacting supplements  Recommended taking B12 500 mcg daily long-term  Follow-up to be decided   Patient Instructions  B 12 500ug daily    Elayne Snare 05/13/2019, 2:14 PM  Note: This office note was prepared with Insurance underwriter. Any transcriptional errors that result from this process are unintentional.  ADDENDUM: TSH is still high at 22 Since free T4 is lower than usual she will go up to 150 mcg levothyroxine instead of 125 Follow-up in about 2 months  [place]ol[place] Denis Lucianne Muss

## 2019-05-13 NOTE — Patient Instructions (Signed)
B 12 500ug daily

## 2019-05-15 NOTE — Telephone Encounter (Signed)
LMTCB to set up the 6 wk appt

## 2019-05-15 NOTE — Telephone Encounter (Signed)
-----   Message from Elayne Snare, MD sent at 05/15/2019 11:35 AM EST ----- Regarding: Follow-up She needs follow-up with labs in 6 weeks

## 2019-06-16 ENCOUNTER — Telehealth: Payer: Self-pay

## 2019-06-16 NOTE — Telephone Encounter (Signed)
Patient has been scheduled for 07/10/2019

## 2019-06-16 NOTE — Telephone Encounter (Signed)
-----   Message from Reather Littler, MD sent at 06/16/2019  9:53 AM EST ----- Regarding: Follow-up appointment Needs to be seen next month with labs for thyroid

## 2019-06-21 ENCOUNTER — Other Ambulatory Visit: Payer: Self-pay | Admitting: Family Medicine

## 2019-06-21 DIAGNOSIS — R062 Wheezing: Secondary | ICD-10-CM

## 2019-07-07 ENCOUNTER — Other Ambulatory Visit: Payer: Self-pay

## 2019-07-07 ENCOUNTER — Other Ambulatory Visit (INDEPENDENT_AMBULATORY_CARE_PROVIDER_SITE_OTHER): Payer: Managed Care, Other (non HMO)

## 2019-07-07 DIAGNOSIS — E063 Autoimmune thyroiditis: Secondary | ICD-10-CM | POA: Diagnosis not present

## 2019-07-07 LAB — T3, FREE: T3, Free: 3.3 pg/mL (ref 2.3–4.2)

## 2019-07-07 LAB — T4, FREE: Free T4: 1.26 ng/dL (ref 0.60–1.60)

## 2019-07-07 LAB — TSH: TSH: 0.84 u[IU]/mL (ref 0.35–4.50)

## 2019-07-10 ENCOUNTER — Ambulatory Visit (INDEPENDENT_AMBULATORY_CARE_PROVIDER_SITE_OTHER): Payer: Managed Care, Other (non HMO) | Admitting: Endocrinology

## 2019-07-10 ENCOUNTER — Other Ambulatory Visit: Payer: Self-pay | Admitting: Endocrinology

## 2019-07-10 ENCOUNTER — Encounter: Payer: Self-pay | Admitting: Endocrinology

## 2019-07-10 ENCOUNTER — Other Ambulatory Visit: Payer: Self-pay

## 2019-07-10 VITALS — BP 110/80 | HR 59 | Ht 68.0 in | Wt 226.0 lb

## 2019-07-10 DIAGNOSIS — E063 Autoimmune thyroiditis: Secondary | ICD-10-CM | POA: Diagnosis not present

## 2019-07-10 MED ORDER — LEVOTHYROXINE SODIUM 150 MCG PO TABS: 150.0000 ug | ORAL_TABLET | Freq: Every day | ORAL | 1 refills | Status: DC

## 2019-07-10 NOTE — Progress Notes (Signed)
Patient ID: Olivia Mendez, female   DOB: 1966-02-19, 54 y.o.   MRN: [id number]           Referring Physician: Cathlean Cower  Reason for Appointment:  Hypothyroidism, follow-up visit    History of Present Illness:   Hypothyroidism was first diagnosed in 1994  Baseline consultation history: At the time of diagnosis patient was having symptoms of fatigue and hair loss.  Also had noticed a swelling in her neck and was found to have a goiter She does not think she had symptoms of, cold sensitivity, difficulty concentrating, dry skin, weight gain  She has been treated by various physicians over the last several years About 5 years or so ago she thinks she was continuing to have fatigue  and when seen by a new endocrinologist she was suggested trying levothyroxine and liothyronine separately However she is not clear whether she started feeling better with this, she may have felt a little less tired        The patient has been treated with either levothyroxine or a combination of levothyroxine and Cytomel  RECENT history: On her initial consultation in 8/19 she was complaining of fatigue She has had chronic cold intolerance  Was again referred back by her PCP in 05/2019 because of abnormal thyroid levels in November However this was partly related to her running out of her liothyronine supplement prior to the labs  Since 12/20 she is taking 150 mcg of levothyroxine and 5 mcg of liothyronine She feels generally fairly good with only mild fatigue No recent weight gain or hair loss She does not think she felt any different with increasing her levothyroxine dose in 12/20  She has been consistent with taking her levothyroxine and liothyronine together when she wakes up  Does not take any calcium or iron containing vitamins at the same time and takes her Protonix at bedtime  Her TSH is now back to normal at 0.84 and free T4 level is improved, free T3 is still normal         Patient's weight  history is as follows:  Wt Readings from Last 3 Encounters:  07/10/19 226 lb (102.5 kg)  05/13/19 226 lb 6.4 oz (102.7 kg)  04/09/19 225 lb (102.1 kg)    Thyroid function results have been as follows:   Lab Results  Component Value Date   TSH 0.84 07/07/2019   TSH 22.34 (H) 05/13/2019   TSH 25.83 (H) 04/09/2019   TSH 3.68 02/21/2018   FREET4 1.26 07/07/2019   FREET4 0.87 05/13/2019   FREET4 1.06 04/09/2019   FREET4 0.98 02/01/2018   T3FREE 3.3 07/07/2019   T3FREE 3.1 05/13/2019   T3FREE 3.4 02/01/2018     Past Medical History:  Diagnosis Date  . Allergic rhinitis 12/26/2014  . Aortic aneurysm ([place]) 2018  . Endometriosis 1990's  . Hypothyroid   . Migraines     Past Surgical History:  Procedure Laterality Date  . APPENDECTOMY    . CHOLECYSTECTOMY    . OTHER SURGICAL HISTORY     surgery for Endometriosis  . TEE WITHOUT CARDIOVERSION N/A 03/22/2017   Procedure: TRANSESOPHAGEAL ECHOCARDIOGRAM (TEE);  Surgeon: Adrian Prows, MD;  Location: [facility] ENDOSCOPY;  Service: Cardiovascular;  Laterality: N/A;    Family History  Problem Relation Age of Onset  . Diabetes Mother   . Multiple sclerosis Maternal Grandmother   . Heart disease Maternal Grandfather   . Cancer Paternal Grandmother        melanoma  .  Heart disease Paternal Grandfather   . Healthy Father   . Thyroid disease Cousin   . Thyroid disease Maternal Aunt     Social History:  reports that she has never smoked. She has never used smokeless tobacco. She reports previous alcohol use. She reports that she does not use drugs.  Allergies:  Allergies  Allergen Reactions  . Vicodin [Hydrocodone-Acetaminophen] Nausea And Vomiting    Allergies as of 07/10/2019      Reactions   Vicodin [hydrocodone-acetaminophen] Nausea And Vomiting      Medication List       Accurate as of July 10, 2019  3:52 PM. If you have any questions, ask your nurse or doctor.        albuterol 108 (90 Base) MCG/ACT inhaler Commonly  known as: VENTOLIN HFA TAKE 2 PUFFS BY MOUTH EVERY 6 HOURS AS NEEDED FOR WHEEZE OR SHORTNESS OF BREATH   b complex vitamins tablet Take 1 tablet by mouth daily.   C-COMPLEX PO Take 1,000 mg by mouth.   co-enzyme Q-10 30 MG capsule Take 200 mg by mouth daily.   Fish Oil 1360 MG Caps Take 1,600 mg by mouth.   levothyroxine 150 MCG tablet Commonly known as: SYNTHROID TAKE 1 TABLET BY MOUTH EVERY DAY   liothyronine 5 MCG tablet Commonly known as: CYTOMEL Take one tablet daily   losartan 100 MG tablet Commonly known as: COZAAR TAKE 1 TABLET DAILY   pantoprazole 40 MG tablet Commonly known as: PROTONIX Take 1 tablet by mouth daily.   Phenylephrine-guaiFENesin 5-100 MG/5ML Liqd Take 5 mLs by mouth 3 (three) times daily as needed.   Vitamin D (Ergocalciferol) 1.25 MG (50000 UNIT) Caps capsule Commonly known as: DRISDOL Take 1 capsule (50,000 Units total) by mouth every 7 (seven) days.   VITAMIN D-3 PO Take 2,000 Units by mouth.         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  She has been on a combination of levothyroxine 150 mcg and 5 mcg of liothyronine She has not had any unusual fatigue Has been regular with her thyroid supplements  Her dose of levothyroxine was increased in 12/20 and with this her TSH is back to normal However she did not have as many symptoms as  at her initial diagnosis when she was hypothyroid recently  Vitamin B12 deficiency: She has been taking a supplement and this should be safe to use long-term  PLAN:   No change in thyroid supplements Follow-up in 6 months Continue B12  There are no Patient Instructions on file for this visit.   1/21 07/10/2019, 3:52 PM     Note: This office note was prepared with Dragon voice recognition system technology. Any transcriptional errors that result from  this process are unintentional.    Reather Littler

## 2019-07-16 ENCOUNTER — Other Ambulatory Visit: Payer: Self-pay

## 2019-07-16 MED ORDER — LIOTHYRONINE SODIUM 5 MCG PO TABS: ORAL_TABLET | ORAL | 0 refills | Status: DC

## 2019-07-20 ENCOUNTER — Other Ambulatory Visit: Payer: Self-pay | Admitting: Internal Medicine

## 2019-07-20 NOTE — Telephone Encounter (Signed)
No need further high dose vit d  Please change to OTC Vitamin D3 at 2000 units per day, indefinitely. 

## 2019-08-08 ENCOUNTER — Other Ambulatory Visit: Payer: Self-pay | Admitting: Endocrinology

## 2019-08-30 ENCOUNTER — Other Ambulatory Visit: Payer: Self-pay | Admitting: Internal Medicine

## 2019-08-30 NOTE — Telephone Encounter (Signed)
Please change to OTC Vitamin D3 at 2000 units per day, indefinitely.  

## 2019-09-15 ENCOUNTER — Encounter: Payer: Self-pay | Admitting: Family

## 2019-09-15 ENCOUNTER — Ambulatory Visit: Payer: Managed Care, Other (non HMO) | Admitting: Family

## 2019-09-15 ENCOUNTER — Other Ambulatory Visit: Payer: Self-pay

## 2019-09-15 ENCOUNTER — Ambulatory Visit (INDEPENDENT_AMBULATORY_CARE_PROVIDER_SITE_OTHER): Payer: Managed Care, Other (non HMO)

## 2019-09-15 VITALS — BP 116/74 | HR 84 | Temp 98.0°F | Ht 68.0 in | Wt 223.4 lb

## 2019-09-15 DIAGNOSIS — R0781 Pleurodynia: Secondary | ICD-10-CM | POA: Diagnosis not present

## 2019-09-15 MED ORDER — MELOXICAM 15 MG PO TABS: 15.0000 mg | ORAL_TABLET | Freq: Every day | ORAL | 0 refills | Status: DC
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
## 2019-09-16 ENCOUNTER — Ambulatory Visit: Payer: Managed Care, Other (non HMO) | Admitting: Family

## 2019-10-09 ENCOUNTER — Other Ambulatory Visit: Payer: Self-pay | Admitting: Endocrinology

## 2019-11-04 ENCOUNTER — Other Ambulatory Visit: Payer: Self-pay

## 2019-11-04 ENCOUNTER — Ambulatory Visit
Admission: RE | Admit: 2019-11-04 | Discharge: 2019-11-04 | Disposition: A | Discharge status: Home / Self Care | Payer: Managed Care, Other (non HMO) | Source: Ambulatory Visit | Attending: Cardiology | Admitting: Cardiology

## 2019-11-04 DIAGNOSIS — I712 Thoracic aortic aneurysm, without rupture, unspecified: Secondary | ICD-10-CM

## 2019-11-04 MED ORDER — IOPAMIDOL (ISOVUE-370) INJECTION 76%
75.0000 mL | Freq: Once | INTRAVENOUS | Status: AC | PRN
  Administered 2019-11-04: 75 mL via INTRAVENOUS

## 2019-11-06 NOTE — Progress Notes (Signed)
Primary Physician/Referring:  Corwin Levins, MD  Patient ID: Olivia Mendez, female    DOB: Nov 05, 1965, 54 y.o.   MRN: [id number]  Chief Complaint  Patient presents with   AAA   bicuspid aortic valve   Follow-up    1 year   HPI:    Olivia Mendez  is a 54 y.o. 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  Denies chest pain or shortness of breath.  Tolerating losartan well.  Past Medical History:  Diagnosis Date   Allergic rhinitis 12/26/2014   Aortic aneurysm (HCC) 2018   Endometriosis 1990's   Hypothyroid    Migraines    Past Surgical History:  Procedure Laterality Date   APPENDECTOMY     CHOLECYSTECTOMY     OTHER SURGICAL HISTORY     surgery for Endometriosis   TEE WITHOUT CARDIOVERSION N/A 03/22/2017   Procedure: TRANSESOPHAGEAL ECHOCARDIOGRAM (TEE);  Surgeon: Yates Decamp, MD;  Location: [facility] ENDOSCOPY;  Service: Cardiovascular;  Laterality: N/A;   Family History  Problem Relation Age of Onset   Diabetes Mother    Multiple sclerosis Maternal Grandmother    Heart disease Maternal Grandfather    Cancer Paternal Grandmother        melanoma   Heart disease Paternal Grandfather    Healthy Father    Thyroid disease Cousin    Thyroid disease Maternal Aunt     Social History   Tobacco Use   Smoking status: Never Smoker   Smokeless tobacco: Never Used  Substance Use Topics   Alcohol use: Not Currently    Alcohol/week: 0.0 standard drinks   Marital Status: Married  ROS  Review of Systems  Cardiovascular: Negative for dyspnea on exertion, leg swelling and syncope.  Gastrointestinal: Negative for melena.   Objective  Blood pressure 113/82, pulse 79, resp. rate 17, height 5\' 8"  (1.727 m), weight 220 lb 6.4 oz (100 kg), last menstrual period 10/03/2008, SpO2 95 %.  Vitals with  BMI 11/07/2019 09/15/2019 07/10/2019  Height 5\' 8"  5\' 8"  5\' 8"   Weight 220 lbs 6 oz 223 lbs 6 oz 226 lbs  BMI 33.52 33.98 34.37  Systolic 113 116 09/07/2019  Diastolic 82 74 80  Pulse 79 84 59     Physical Exam  Constitutional: She appears well-developed and well-nourished. No distress.  Mildly obese  HENT:  Head: Atraumatic.  Cardiovascular: Normal rate, regular rhythm, S1 normal, S2 normal, intact distal pulses and normal pulses. Exam reveals no gallop.  Murmur heard.  Early systolic murmur is present with a grade of 2/6 at the upper right sternal border. No leg edema, no JVD.   Pulmonary/Chest: Effort normal and breath sounds normal. No accessory muscle usage.  Abdominal: Soft. Bowel sounds are normal.   Laboratory examination:   Recent Labs    04/09/19 1440  NA 139  K 4.5  CL 103  CO2 28  GLUCOSE 88  BUN 14  CREATININE 0.81  CALCIUM 9.8   CrCl cannot be calculated (Patient's most recent lab result is older than the maximum 21 days allowed.).  CMP Latest Ref Rng & Units 04/09/2019 02/21/2018 02/08/2017  Glucose 70 - 99 mg/dL 88 88 89  BUN 6 - 23 mg/dL 14 12 9   Creatinine 0.40 - 1.20 mg/dL 13/04/20 13/09/2018 02/23/2018  Sodium 135 - 145 mEq/L 139 140 139  Potassium 3.5 -  5.1 mEq/L 4.5 4.3 4.3  Chloride 96 - 112 mEq/L 103 105 105  CO2 19 - 32 mEq/L 28 27 28   Calcium 8.4 - 10.5 mg/dL 9.8 9.6  Total Protein 6.0 - 8.3 g/dL 7.1 6.7 -  Total Bilirubin 0.2 - 1.2 mg/dL 1.1 0.7 -  Alkaline Phos 39 - 117 U/L 71 56 -  AST 0 - 37 U/L 17 19 -  ALT 0 - 35 U/L 24 25 -   CBC Latest Ref Rng & Units 04/09/2019 02/21/2018 11/13/2014  WBC 4.0 - 10.5 K/uL 7.1 5.9 6.9  Hemoglobin 12.0 - 15.0 g/dL 01/13/2015 32.4 40.1  Hematocrit 36.0 - 46.0 % 40.8 39.1 41.4  Platelets 150.0 - 400.0 K/uL 286.0 286.0 314.0      Component Value Date/Time   CHOL 208 (H) 04/09/2019 1440   TRIG 68.0 04/09/2019 1440   HDL 60.50 04/09/2019 1440   CHOLHDL 3 04/09/2019 1440   VLDL 13.6 04/09/2019 1440   LDLCALC 134 (H) 04/09/2019 1440    HEMOGLOBIN A1C Lab Results  Component Value Date   HGBA1C 5.5 11/13/2014   TSH Recent Labs    04/09/19 1440 05/13/19 1430 07/07/19 1135  TSH 25.83* 22.34* 0.84   Medications and allergies   Allergies  Allergen Reactions   Vicodin [Hydrocodone-Acetaminophen] Nausea And Vomiting     Current Outpatient Medications  Medication Instructions   Bioflavonoid Products (C-COMPLEX PO) 1,000 mg, Oral   Cholecalciferol (VITAMIN D-3 PO) 2,000 Units, Oral   CVS Bisacodyl 5 mg, Oral, Daily at bedtime   levothyroxine (SYNTHROID) 150 MCG tablet TAKE 1 TABLET BY MOUTH EVERY DAY   liothyronine (CYTOMEL) 5 MCG tablet TAKE 1 TABLET BY MOUTH EVERY DAY   losartan (COZAAR) 100 MG tablet TAKE 1 TABLET DAILY   pantoprazole (PROTONIX) 40 MG tablet 1 tablet, Oral, Daily   polyethylene glycol-electrolytes (NULYTELY) 420 g solution 4,000 mLs, Oral, As directed   vitamin B-12 (CYANOCOBALAMIN) 1,000 mcg, Oral, Daily   Vitamin D (Ergocalciferol) (DRISDOL) 50,000 Units, Oral, Every 7 days   Radiology:   See below  Cardiac Studies:   Treadmill stress test 2017/04/04: Indication: Chest pain The patient exercised on Bruce protocol. Exercise time was 06:59 min . Patient achieved maximum HR of 158 BPM. The target heart rate was 111%, 85% of 94% . Resting EKG demonstrated NSR. Stress EKG: ST Changes: Depression upsloping, noted at pea/k exercise but back to baseline immediately into recovery at < 1 minute. Arrhythmias: ventricular premature beats-isolated. Chest Pain: non-limiting. BP Response to Exercise: Normal resting BP- appropriate response. HR Response to Exercise: Appropriat.e. Exercise capacity was normal. Overall Impression: Normal stress test. Exercise capacity was normal. Suspect GERD to be etiology for cough and chest pain with exercise. Clinical correlation recommended. Low risk stress EKG.  Echocardiogram 11/04/2018:  Normal LV systolic function with EF 57%. Left ventricle cavity is  normal in size. Normal global wall motion. Normal diastolic filling pattern. Calculated EF 57%. Right ventricle cavity is borderline dilated. Normal right ventricular function. Not well visualized. Possible Bicuspid aortic valve with no regurgitation noted. Mildly restricted aortic valve leaflets. Trace aortic valve stenosis. Aortic valve peak pressure gradient of  20 and mean gradient of 10 mmHg, calculated aortic valve area 1.83 cm. Structurally normal mitral valve. Mild (Grade I) mitral regurgitation. The aortic root is mildly dilated at 3.8 cm. IVC is dilated with respiratory variation. This may suggest elevated right heart pressure No significant change from 09/04/2017.   TEE 03/22/2017: Normal LV. Bicuspid aortic valve without  any thickening or calcification. Aortic root dilated at 4 cm.  CT angiogram of the chest 11/05/2019: Comparison 03/13/2018. 1. Stable aneurysmal disease of the ascending thoracic aorta measuring up to 4.2 cm in maximum caliber. Recommend annual imaging followup by CTA or MRA. This recommendation follows 2010 ACCF/AHA/AATS/ACR/ASA/SCA/SCAI/SIR/STS/SVM Guidelines for the Diagnosis and Management of Patients with Thoracic Aortic Disease. Circulation. 2010; 121: Z610-R604. Aortic aneurysm NOS (ICD10-I71.9) 2. Stable partial anomalous pulmonary venous drainage on the left with the left superior pulmonary vein draining into the left brachiocephalic vein. This can be a cause of hypoxia, especially with exertion. 3. Subtle area of ill-defined ground-glass density in the right upper lobe was probably present on the prior study but appears more conspicuous/prominent. This may simply be a benign area of scarring but can be followed on subsequent scans. 4. Stable hepatic steatosis.  EKG  EKG 11/07/2019: Normal sinus rhythm at rate of 67 bpm, normal axis, incomplete right bundle branch block.  No evidence of ischemia, normal EKG.    Assessment     ICD-10-CM   1. Bicuspid  aortic valve  Q23.1 EKG 12-Lead  2. Ascending aortic aneurysm (HCC)  I71.2   3. 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 Mild hyperlipidemia  E78.5      Recommendations:   Olivia Mendez  is a 54 y.o. 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  She remains asymptomatic.  She is tolerating losartan 100 mg daily without any side effects.  I reviewed the results of the CT angiogram, she does have partial anomalous venous return of the pulmonary veins, however asymptomatic.  Recommend continued observation for now in view of absence of any right ventricular strain or symptoms.  I will see her back on annual basis.  With regard to repeating CT angiogram, I will probably skip for the next year unless I feel clinically indicated.  Mild aortic aneurysm has remained stable over the past 2 and half years.  With regard to mild hyperlipidemia, she is presently not on a statin, but she is trying to make lifestyle changes for weight loss.  If this is not achieved by next office visit in a year, we should consider at least a low-dose high intensity statin therapy.  Adrian Prows, MD, [facility] 11/07/2019, 1:31 PM [facility] Pager: [phone] Office: [phone]

## 2019-11-07 ENCOUNTER — Other Ambulatory Visit: Payer: Self-pay

## 2019-11-07 ENCOUNTER — Encounter: Payer: Self-pay | Admitting: Cardiology

## 2019-11-07 ENCOUNTER — Ambulatory Visit: Payer: Managed Care, Other (non HMO) | Admitting: Cardiology

## 2019-11-07 VITALS — BP 113/82 | HR 79 | Resp 17 | Ht 68.0 in | Wt 220.4 lb

## 2019-11-07 DIAGNOSIS — I7121 Aneurysm of the ascending aorta, without rupture: Secondary | ICD-10-CM

## 2019-11-07 DIAGNOSIS — Q231 Congenital insufficiency of aortic valve: Secondary | ICD-10-CM

## 2019-11-07 DIAGNOSIS — Q263 Partial anomalous pulmonary venous connection: Secondary | ICD-10-CM

## 2019-11-07 DIAGNOSIS — E785 Hyperlipidemia, unspecified: Secondary | ICD-10-CM

## 2019-11-11 ENCOUNTER — Other Ambulatory Visit: Payer: Self-pay

## 2019-11-11 MED ORDER — LIOTHYRONINE SODIUM 5 MCG PO TABS: ORAL_TABLET | ORAL | 2 refills | Status: DC

## 2019-11-11 MED ORDER — LEVOTHYROXINE SODIUM 150 MCG PO TABS: 150.0000 ug | ORAL_TABLET | Freq: Every day | ORAL | 1 refills | Status: DC

## 2019-12-31 ENCOUNTER — Other Ambulatory Visit: Payer: Self-pay

## 2019-12-31 ENCOUNTER — Other Ambulatory Visit (INDEPENDENT_AMBULATORY_CARE_PROVIDER_SITE_OTHER): Payer: Managed Care, Other (non HMO)

## 2019-12-31 DIAGNOSIS — E063 Autoimmune thyroiditis: Secondary | ICD-10-CM

## 2019-12-31 LAB — T4, FREE: Free T4: 1.49 ng/dL (ref 0.60–1.60)

## 2019-12-31 LAB — TSH: TSH: 0.08 u[IU]/mL — ABNORMAL LOW (ref 0.35–4.50)

## 2020-01-06 ENCOUNTER — Other Ambulatory Visit: Payer: Self-pay | Admitting: Endocrinology

## 2020-01-06 NOTE — Progress Notes (Signed)
Patient ID: Olivia Mendez, female   DOB: [birth year], 54 y.o.   MRN: [id number]           Referring Physician: Oliver Barre  Reason for Appointment:  Hypothyroidism, follow-up visit    History of Present Illness:   Hypothyroidism was first diagnosed in 1994  Baseline consultation history: At the time of diagnosis patient was having symptoms of fatigue and hair loss.  Also had noticed a swelling in her neck and was found to have a goiter She does not think she had symptoms of, cold sensitivity, difficulty concentrating, dry skin, weight gain  She has been treated by various physicians over the last several years About 5 years or so ago she thinks she was continuing to have fatigue  and when seen by a new endocrinologist she was suggested trying levothyroxine and liothyronine separately However she is not clear whether she started feeling better with this, she may have felt a little less tired        The patient has been treated with either levothyroxine or a combination of levothyroxine and Cytomel  RECENT history: On her initial consultation in 8/19 she was complaining of fatigue She has had chronic cold intolerance  Since 12/20 she is taking 150 mcg of levothyroxine and 5 mcg of liothyronine The dose was continued unchanged on her last visit in 2/21  No complaints of unusual tiredness She appears to be losing weight gradually and has lost about 10 pounds since 2/21 No shakiness or palpitations, does feel warm at times  She has been consistent with taking her levothyroxine and liothyronine together when she wakes up  Does not take any calcium or iron containing vitamins at the same time and takes her Protonix at bedtime  Her TSH is now down to 0.08 even though previously was back to normal at 0.84 Free T4 level is relatively higher at 1.5, free T3 is still normal         Patient's weight history is as follows:  Wt Readings from Last 3 Encounters:  01/07/20 216 lb 12.8 oz (98.3  kg)  11/07/19 220 lb 6.4 oz (100 kg)  09/15/19 223 lb 6.4 oz (101.3 kg)    Thyroid function results have been as follows:   Lab Results  Component Value Date   TSH 0.08 (L) 12/31/2019   TSH 0.84 07/07/2019   TSH 22.34 (H) 05/13/2019   TSH 25.83 (H) 04/09/2019   FREET4 1.49 12/31/2019   FREET4 1.26 07/07/2019   FREET4 0.87 05/13/2019   FREET4 1.06 04/09/2019   T3FREE 3.3 07/07/2019   T3FREE 3.1 05/13/2019   T3FREE 3.4 02/01/2018     Past Medical History:  Diagnosis Date  . Allergic rhinitis 12/26/2014  . Aortic aneurysm (HCC) 2018  . Endometriosis 1990's  . Hypothyroid   . Migraines     Past Surgical History:  Procedure Laterality Date  . APPENDECTOMY    . CHOLECYSTECTOMY    . OTHER SURGICAL HISTORY     surgery for Endometriosis  . TEE WITHOUT CARDIOVERSION N/A 03/22/2017   Procedure: TRANSESOPHAGEAL ECHOCARDIOGRAM (TEE);  Surgeon: Yates Decamp, MD;  Location: [facility] ENDOSCOPY;  Service: Cardiovascular;  Laterality: N/A;    Family History  Problem Relation Age of Onset  . Diabetes Mother   . Multiple sclerosis Maternal Grandmother   . Heart disease Maternal Grandfather   . Cancer Paternal Grandmother        melanoma  . Heart disease Paternal Grandfather   . Healthy Father   .  Thyroid disease Cousin   . Thyroid disease Maternal Aunt     Social History:  reports that she has never smoked. She has never used smokeless tobacco. She reports previous alcohol use. She reports that she does not use drugs.  Allergies:  Allergies  Allergen Reactions  . Vicodin [Hydrocodone-Acetaminophen] Nausea And Vomiting    Allergies as of 01/07/2020      Reactions   Vicodin [hydrocodone-acetaminophen] Nausea And Vomiting      Medication List       Accurate as of January 07, 2020  8:59 AM. If you have any questions, ask your nurse or doctor.        C-COMPLEX PO Take 1,000 mg by mouth.   [facility] Bisacodyl 5 MG EC tablet Generic drug: bisacodyl Take 5 mg by mouth at bedtime.    levothyroxine 150 MCG tablet Commonly known as: SYNTHROID TAKE 1 TABLET DAILY   liothyronine 5 MCG tablet Commonly known as: CYTOMEL TAKE 1 TABLET BY MOUTH EVERY DAY   losartan 100 MG tablet Commonly known as: COZAAR TAKE 1 TABLET DAILY   pantoprazole 40 MG tablet Commonly known as: PROTONIX Take 1 tablet by mouth daily.   polyethylene glycol-electrolytes 420 g solution Commonly known as: NuLYTELY Take 4,000 mLs by mouth as directed.   vitamin B-12 1000 MCG tablet Commonly known as: CYANOCOBALAMIN Take 1,000 mcg by mouth daily.   Vitamin D (Ergocalciferol) 1.25 MG (50000 UNIT) Caps capsule Commonly known as: DRISDOL Take 1 capsule (50,000 Units total) by mouth every 7 (seven) days.   VITAMIN D-3 PO Take 2,000 Units by mouth.         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   1/21 01/07/2020, 8:59 AM     Note: This office note was prepared with Dragon voice recognition system technology. Any transcriptional errors that result from this process are unintentional.    03/08/2020

## 2020-01-07 ENCOUNTER — Ambulatory Visit (INDEPENDENT_AMBULATORY_CARE_PROVIDER_SITE_OTHER): Payer: Managed Care, Other (non HMO) | Admitting: Endocrinology

## 2020-01-07 ENCOUNTER — Encounter: Payer: Self-pay | Admitting: Endocrinology

## 2020-01-07 ENCOUNTER — Other Ambulatory Visit: Payer: Self-pay

## 2020-01-07 VITALS — BP 120/76 | HR 69 | Ht 68.0 in | Wt 216.8 lb

## 2020-01-07 DIAGNOSIS — E063 Autoimmune thyroiditis: Secondary | ICD-10-CM | POA: Diagnosis not present

## 2020-01-07 MED ORDER — LEVOTHYROXINE SODIUM 137 MCG PO TABS
137.0000 ug | ORAL_TABLET | Freq: Every day | ORAL | 1 refills | Status: DC
Start: 2020-01-07 — End: 2020-01-07

## 2020-01-07 MED ORDER — SYNTHROID 137 MCG PO TABS
137.0000 ug | ORAL_TABLET | Freq: Every day | ORAL | 2 refills | Status: DC
Start: 2020-01-07 — End: 2020-02-25

## 2020-01-13 ENCOUNTER — Ambulatory Visit: Payer: Managed Care, Other (non HMO) | Admitting: Nurse Practitioner

## 2020-01-13 ENCOUNTER — Encounter: Payer: Self-pay | Admitting: Nurse Practitioner

## 2020-01-13 ENCOUNTER — Other Ambulatory Visit: Payer: Self-pay

## 2020-01-13 VITALS — BP 124/78 | Ht 68.0 in | Wt 215.0 lb

## 2020-01-13 DIAGNOSIS — Z01419 Encounter for gynecological examination (general) (routine) without abnormal findings: Secondary | ICD-10-CM | POA: Diagnosis not present

## 2020-01-13 DIAGNOSIS — Z13820 Encounter for screening for osteoporosis: Secondary | ICD-10-CM

## 2020-01-13 DIAGNOSIS — Z780 Asymptomatic menopausal state: Secondary | ICD-10-CM

## 2020-01-13 NOTE — Patient Instructions (Addendum)
[facility] [phone] [address]  [place], [place] [address]   Health Maintenance for Postmenopausal Women Menopause is a normal process in which your ability to get pregnant comes to an end. This process happens slowly over many months or years, usually between the ages of 48 and 55. Menopause is complete when you have missed your menstrual periods for 12 months. It is important to talk with your health care provider about some of the most common conditions that affect women after menopause (postmenopausal women). These include heart disease, cancer, and bone loss (osteoporosis). Adopting a healthy lifestyle and getting preventive care can help to promote your health and wellness. The actions you take can also lower your chances of developing some of these common conditions. What should I know about menopause? During menopause, you may get a number of symptoms, such as:  Hot flashes. These can be moderate or severe.  Night sweats.  Decrease in sex drive.  Mood swings.  Headaches.  Tiredness.  Irritability.  Memory problems.  Insomnia. Choosing to treat or not to treat these symptoms is a decision that you make with your health care provider. Do I need hormone replacement therapy?  Hormone replacement therapy is effective in treating symptoms that are caused by menopause, such as hot flashes and night sweats.  Hormone replacement carries certain risks, especially as you become older. If you are thinking about using estrogen or estrogen with progestin, discuss the benefits and risks with your health care provider. What is my risk for heart disease and stroke? The risk of heart disease, heart attack, and stroke increases as you age. One of the causes may be a change in the body's hormones during menopause. This can affect how your body uses dietary fats, triglycerides, and cholesterol. Heart attack and stroke are medical emergencies. There are many  things that you can do to help prevent heart disease and stroke. Watch your blood pressure  High blood pressure causes heart disease and increases the risk of stroke. This is more likely to develop in people who have high blood pressure readings, are of African descent, or are overweight.  Have your blood pressure checked: ? Every 3-5 years if you are 18-39 years of age. ? Every year if you are 40 years old or older. Eat a healthy diet   Eat a diet that includes plenty of vegetables, fruits, low-fat dairy products, and lean protein.  Do not eat a lot of foods that are high in solid fats, added sugars, or sodium. Get regular exercise Get regular exercise. This is one of the most important things you can do for your health. Most adults should:  Try to exercise for at least 150 minutes each week. The exercise should increase your heart rate and make you sweat (moderate-intensity exercise).  Try to do strengthening exercises at least twice each week. Do these in addition to the moderate-intensity exercise.  Spend less time sitting. Even light physical activity can be beneficial. Other tips  Work with your health care provider to achieve or maintain a healthy weight.  Do not use any products that contain nicotine or tobacco, such as cigarettes, e-cigarettes, and chewing tobacco. If you need help quitting, ask your health care provider.  Know your numbers. Ask your health care provider to check your cholesterol and your blood sugar (glucose). Continue to have your blood tested as directed by your health care provider. Do I need screening for cancer? Depending on your health   history and family history, you may need to have cancer screening at different stages of your life. This may include screening for:  Breast cancer.  Cervical cancer.  Lung cancer.  Colorectal cancer. What is my risk for osteoporosis? After menopause, you may be at increased risk for osteoporosis. Osteoporosis is  a condition in which bone destruction happens more quickly than new bone creation. To help prevent osteoporosis or the bone fractures that can happen because of osteoporosis, you may take the following actions:  If you are 19-50 years old, get at least 1,000 mg of calcium and at least 600 mg of vitamin D per day.  If you are older than age 50 but younger than age 70, get at least 1,200 mg of calcium and at least 600 mg of vitamin D per day.  If you are older than age 70, get at least 1,200 mg of calcium and at least 800 mg of vitamin D per day. Smoking and drinking excessive alcohol increase the risk of osteoporosis. Eat foods that are rich in calcium and vitamin D, and do weight-bearing exercises several times each week as directed by your health care provider. How does menopause affect my mental health? Depression may occur at any age, but it is more common as you become older. Common symptoms of depression include:  Low or sad mood.  Changes in sleep patterns.  Changes in appetite or eating patterns.  Feeling an overall lack of motivation or enjoyment of activities that you previously enjoyed.  Frequent crying spells. Talk with your health care provider if you think that you are experiencing depression. General instructions See your health care provider for regular wellness exams and vaccines. This may include:  Scheduling regular health, dental, and eye exams.  Getting and maintaining your vaccines. These include: ? Influenza vaccine. Get this vaccine each year before the flu season begins. ? Pneumonia vaccine. ? Shingles vaccine. ? Tetanus, diphtheria, and pertussis (Tdap) booster vaccine. Your health care provider may also recommend other immunizations. Tell your health care provider if you have ever been abused or do not feel safe at home. Summary  Menopause is a normal process in which your ability to get pregnant comes to an end.  This condition causes hot flashes, night  sweats, decreased interest in sex, mood swings, headaches, or lack of sleep.  Treatment for this condition may include hormone replacement therapy.  Take actions to keep yourself healthy, including exercising regularly, eating a healthy diet, watching your weight, and checking your blood pressure and blood sugar levels.  Get screened for cancer and depression. Make sure that you are up to date with all your vaccines. This information is not intended to replace advice given to you by your health care provider. Make sure you discuss any questions you have with your health care provider. Document Revised: 05/15/2018 Document Reviewed: 05/15/2018 Elsevier Patient Education  2020 Elsevier Inc[employer]  

## 2020-01-13 NOTE — Progress Notes (Signed)
   Olivia Mendez [birth year] [id number]   History:  54 y.o. G0 presents for annual exam. Postmenopausal since early 55s- no HRT, no bleeding. Hypothyroidism managed by endocrinology.   Gynecologic History Patient's last menstrual period was 10/03/2008.   Contraception: post menopausal status Last Pap: 12/05/2016. Results were: normal Last mammogram: 04/03/2017. Results were: normal Last colonoscopy: 11/2019 per patient. Results were: normal Last Dexa: never  Past medical history, past surgical history, family history and social history were all reviewed and documented in the EPIC chart.  ROS:  A ROS was performed and pertinent positives and negatives are included.  Exam:  Vitals:   01/13/20 1630  BP: 124/78  Weight: 215 lb (97.5 kg)  Height: 5\' 8"  (1.727 m)   Body mass index is 32.69 kg/m.  General appearance:  Normal Thyroid:  Symmetrical, normal in size, without palpable masses or nodularity. Respiratory  Auscultation:  Clear without wheezing or rhonchi Cardiovascular  Auscultation:  Regular rate, without rubs, murmurs or gallops  Edema/varicosities:  Not grossly evident Abdominal  Soft,nontender, without masses, guarding or rebound.  Liver/spleen:  No organomegaly noted  Hernia:  None appreciated  Skin  Inspection:  Grossly normal   Breasts: Examined lying and sitting.   Right: Without masses, retractions, discharge or axillary adenopathy.   Left: Without masses, retractions, discharge or axillary adenopathy. Gentitourinary   Inguinal/mons:  Normal without inguinal adenopathy  External genitalia:  Normal  BUS/Urethra/Skene's glands:  Normal  Vagina:  Normal  Cervix:  Normal  Uterus:  Normal in size, shape and contour.  Midline and mobile  Adnexa/parametria:     Rt: Without masses or tenderness.   Lt: Without masses or tenderness.  Anus and perineum: Normal  Assessment/Plan:  54 y.o. G0 for annual exam.   Well female exam with routine gynecological exam -  Education provided on SBEs, importance of preventative screenings, current guidelines, high calcium diet, regular exercise, and multivitamin daily. Labs done with PCP.   Postmenopausal - Plan: DG Bone Density. Menopause in early 20s. Has not had a bone density screening.  Screening for breast cancer- overdue. Last in 2018. Discussed current guidelines and importance of preventative screening. [facility] provided on the [facility]. She plans to schedule soon.   Follow up in 1 year for annual      2019 [facility], 4:32 PM 01/13/2020

## 2020-02-18 ENCOUNTER — Other Ambulatory Visit: Payer: Self-pay

## 2020-02-18 ENCOUNTER — Other Ambulatory Visit (INDEPENDENT_AMBULATORY_CARE_PROVIDER_SITE_OTHER): Payer: Managed Care, Other (non HMO)

## 2020-02-18 DIAGNOSIS — E063 Autoimmune thyroiditis: Secondary | ICD-10-CM | POA: Diagnosis not present

## 2020-02-18 LAB — TSH: TSH: 0.16 u[IU]/mL — ABNORMAL LOW (ref 0.35–4.50)

## 2020-02-18 LAB — T4, FREE: Free T4: 1.31 ng/dL (ref 0.60–1.60)

## 2020-02-20 ENCOUNTER — Ambulatory Visit: Payer: Managed Care, Other (non HMO) | Admitting: Internal Medicine

## 2020-02-20 ENCOUNTER — Encounter: Payer: Self-pay | Admitting: Internal Medicine

## 2020-02-20 ENCOUNTER — Other Ambulatory Visit: Payer: Self-pay

## 2020-02-20 VITALS — BP 120/80 | HR 72 | Temp 98.7°F | Ht 68.0 in | Wt 215.0 lb

## 2020-02-20 DIAGNOSIS — R21 Rash and other nonspecific skin eruption: Secondary | ICD-10-CM | POA: Diagnosis not present

## 2020-02-20 DIAGNOSIS — Z23 Encounter for immunization: Secondary | ICD-10-CM | POA: Diagnosis not present

## 2020-02-20 MED ORDER — FLUCONAZOLE 100 MG PO TABS
100.0000 mg | ORAL_TABLET | Freq: Every day | ORAL | 0 refills | Status: AC
Start: 2020-02-20 — End: 2020-02-27

## 2020-02-20 NOTE — Progress Notes (Signed)
Subjective:    Patient ID: Olivia Mendez, female    DOB: 07-31-1965, 54 y.o.   MRN: [id number]  HPI   Here with f/u groin inguinal rash x 2-3 wks with itching that is slightly improved after seeing uc with fluconazole 150 mg x 1 and triam cr daily.  Pt denies chest pain, increased sob or doe, wheezing, orthopnea, PND, increased LE swelling, palpitations, dizziness or syncope.   Pt denies polydipsia, polyuria,  Pt denies fever, wt loss, night sweats, loss of appetite, or other constitutional symptoms Past Medical History:  Diagnosis Date  . Allergic rhinitis 12/26/2014  . Aortic aneurysm (HCC) 2018  . Endometriosis 1990's  . Hypothyroid   . Migraines    Past Surgical History:  Procedure Laterality Date  . APPENDECTOMY    . CHOLECYSTECTOMY    . OTHER SURGICAL HISTORY     surgery for Endometriosis  . TEE WITHOUT CARDIOVERSION N/A 03/22/2017   Procedure: TRANSESOPHAGEAL ECHOCARDIOGRAM (TEE);  Surgeon: Yates Decamp, MD;  Location: [facility] ENDOSCOPY;  Service: Cardiovascular;  Laterality: N/A;    reports that she has never smoked. She has never used smokeless tobacco. She reports previous alcohol use. She reports that she does not use drugs. family history includes Cancer in her paternal grandmother; Diabetes in her mother; Healthy in her father; Heart disease in her maternal grandfather and paternal grandfather; Multiple sclerosis in her maternal grandmother; Thyroid disease in her cousin and maternal aunt. Allergies  Allergen Reactions  . Vicodin [Hydrocodone-Acetaminophen] Nausea And Vomiting   Current Outpatient Medications on File Prior to Visit  Medication Sig Dispense Refill  . Cholecalciferol (VITAMIN D-3 PO) Take 2,000 Units by mouth.    . hydrOXYzine (ATARAX/VISTARIL) 25 MG tablet 1 tab po qhs for itching    . liothyronine (CYTOMEL) 5 MCG tablet TAKE 1 TABLET BY MOUTH EVERY DAY 30 tablet 2  . losartan (COZAAR) 100 MG tablet TAKE 1 TABLET DAILY 90 tablet 3  . pantoprazole (PROTONIX) 40  MG tablet Take 1 tablet by mouth daily.  6  . SYNTHROID 137 MCG tablet Take 1 tablet (137 mcg total) by mouth daily before breakfast. 30 tablet 2  . vitamin B-12 (CYANOCOBALAMIN) 1000 MCG tablet Take 1,000 mcg by mouth daily.     No current facility-administered medications on file prior to visit.   Review of Systems All otherwise neg per pt    Objective:   Physical Exam BP 120/80 (BP Location: Left Arm, Patient Position: Sitting, Cuff Size: Large)   Pulse 72   Temp 98.7 F (37.1 C) (Oral)   Ht 5\' 8"  (1.727 m)   Wt 215 lb (97.5 kg)   LMP 10/03/2008   SpO2 98%   BMI 32.69 kg/m  VS noted,  Constitutional: Pt appears in NAD HENT: Head: NCAT.  Right Ear: External ear normal.  Left Ear: External ear normal.  Eyes: . Pupils are equal, round, and reactive to light. Conjunctivae and EOM are normal Nose: without d/c or deformity Neck: Neck supple. Gross normal ROM Cardiovascular: Normal rate and regular rhythm.   Pulmonary/Chest: Effort normal and breath sounds without rales or wheezing.  Abd:  Soft, NT, ND, + BS, no organomegaly Neurological: Pt is alert. At baseline orientation, motor grossly intact Skin: Skin is warm. + bilat inguinal nontender rashes, other new lesions, no LE edema Psychiatric: Pt behavior is normal without agitation  All otherwise neg per pt Lab Results  Component Value Date   WBC 7.1 04/09/2019   HGB 13.7 04/09/2019  HCT 40.8 04/09/2019   PLT 286.0 04/09/2019   GLUCOSE 88 04/09/2019   CHOL 208 (H) 04/09/2019   TRIG 68.0 04/09/2019   HDL 60.50 04/09/2019   LDLCALC 134 (H) 04/09/2019   ALT 24 04/09/2019   AST 17 04/09/2019   NA 139 04/09/2019   K 4.5 04/09/2019   CL 103 04/09/2019   CREATININE 0.81 04/09/2019   BUN 14 04/09/2019   CO2 28 04/09/2019   TSH 0.16 (L) 02/18/2020   INR 0.9 ratio 09/14/2008   HGBA1C 5.5 11/13/2014         Assessment & Plan:

## 2020-02-20 NOTE — Patient Instructions (Addendum)
You had the flu shot today  Please take all new medication as prescribed - the diflucan for the rash  Please continue all other medications as before, and refills have been done if requested.  Please have the pharmacy call with any other refills you may need.  Please continue your efforts at being more active, low cholesterol diet, and weight control.  Please keep your appointments with your specialists as you may have planned

## 2020-02-21 ENCOUNTER — Encounter: Payer: Self-pay | Admitting: Internal Medicine

## 2020-02-21 DIAGNOSIS — R21 Rash and other nonspecific skin eruption: Secondary | ICD-10-CM | POA: Insufficient documentation

## 2020-02-21 NOTE — Assessment & Plan Note (Addendum)
C/w intertrigo, for fluconozole 100 qd x 7, cont all other tx  I spent 21 minutes in preparing to see the patient by review of recent labs, imaging and procedures, obtaining and reviewing separately obtained history, communicating with the patient and family or caregiver, ordering medications, tests or procedures, and documenting clinical information in the EHR including the differential Dx, treatment, and any further evaluation and other management of rash

## 2020-02-23 MED ORDER — ZOSTER VAC RECOMB ADJUVANTED 50 MCG/0.5ML IM SUSR: 0.5000 mL | Freq: Once | INTRAMUSCULAR | 1 refills | Status: AC

## 2020-02-24 ENCOUNTER — Ambulatory Visit (INDEPENDENT_AMBULATORY_CARE_PROVIDER_SITE_OTHER): Payer: Managed Care, Other (non HMO)

## 2020-02-24 ENCOUNTER — Other Ambulatory Visit: Payer: Self-pay | Admitting: Nurse Practitioner

## 2020-02-24 ENCOUNTER — Other Ambulatory Visit: Payer: Self-pay

## 2020-02-24 DIAGNOSIS — M85851 Other specified disorders of bone density and structure, right thigh: Secondary | ICD-10-CM

## 2020-02-24 DIAGNOSIS — Z780 Asymptomatic menopausal state: Secondary | ICD-10-CM | POA: Diagnosis not present

## 2020-02-24 DIAGNOSIS — Z13820 Encounter for screening for osteoporosis: Secondary | ICD-10-CM

## 2020-02-25 ENCOUNTER — Other Ambulatory Visit: Payer: Self-pay

## 2020-02-25 DIAGNOSIS — E063 Autoimmune thyroiditis: Secondary | ICD-10-CM

## 2020-02-25 MED ORDER — LEVOTHYROXINE SODIUM 125 MCG PO TABS
125.0000 ug | ORAL_TABLET | Freq: Every day | ORAL | 3 refills | Status: DC
Start: 2020-02-25 — End: 2020-05-06

## 2020-03-26 ENCOUNTER — Encounter: Payer: Self-pay | Admitting: Internal Medicine

## 2020-03-26 NOTE — Telephone Encounter (Signed)
Staff to please assist pt with sat clinic tele visit, and also reschedule her planned visit here for later thanks

## 2020-03-27 ENCOUNTER — Encounter: Payer: Self-pay | Admitting: Family Medicine

## 2020-03-27 ENCOUNTER — Telehealth (INDEPENDENT_AMBULATORY_CARE_PROVIDER_SITE_OTHER): Payer: Managed Care, Other (non HMO) | Admitting: Family Medicine

## 2020-03-27 VITALS — Wt 218.0 lb

## 2020-03-27 DIAGNOSIS — J019 Acute sinusitis, unspecified: Secondary | ICD-10-CM

## 2020-03-27 MED ORDER — AMOXICILLIN-POT CLAVULANATE 875-125 MG PO TABS: 1.0000 | ORAL_TABLET | Freq: Two times a day (BID) | ORAL | 0 refills | Status: AC

## 2020-03-27 NOTE — Progress Notes (Signed)
Virtual Visit via Video Note  I connected with Olivia Mendez  on 03/27/20 at  9:40 AM EDT by a video enabled telemedicine application and verified that I am speaking with the correct person using two identifiers.  Location patient: home Location provider: [facility]  [address] [place], [place] [address] Persons participating in the virtual visit: patient, provider  I discussed the limitations of evaluation and management by telemedicine and the availability of in person appointments. The patient expressed understanding and agreed to proceed.   Olivia Mendez DOB: 04-Mar-1966 Encounter date: 03/27/2020  This is a 54 y.o. female who presents with Chief Complaint  Patient presents with   Sinusitis    x5 days    History of present illness: Started on Monday with tickling in throat, then got congested in head. Has been pretty solid since Tuesday night, weds morning.   Taking otc medications that are not helping. No chest congestion, but some cough (my be drainage).   Taking dayquil. Took some guaifenecin last night which seemed to help with congestion, but coughing more now.   No fevers.   Hasn't been tested for COVID. Wears mask at work, has been vaccinated.   Did have diarrhea yesterday.   Does have coworker who is sick and husband was sick last week. Husband took COVID test last week which was negative.   Not heavy in chest and not feeling SOB. No wheezing.    Allergies  Allergen Reactions   Vicodin [Hydrocodone-Acetaminophen] Nausea And Vomiting   Current Meds  Medication Sig   Cholecalciferol (VITAMIN D-3 PO) Take 2,000 Units by mouth.   hydrOXYzine (ATARAX/VISTARIL) 25 MG tablet 1 tab po qhs for itching   levothyroxine (SYNTHROID) 125 MCG tablet Take 1 tablet (125 mcg total) by mouth daily.   liothyronine (CYTOMEL) 5 MCG tablet TAKE 1 TABLET BY MOUTH EVERY DAY   losartan (COZAAR) 100 MG tablet TAKE 1 TABLET DAILY   pantoprazole (PROTONIX)  40 MG tablet Take 1 tablet by mouth daily.   vitamin B-12 (CYANOCOBALAMIN) 1000 MCG tablet Take 1,000 mcg by mouth daily.    Review of Systems  Constitutional: Negative for chills and fever.  HENT: Positive for congestion, postnasal drip, sinus pressure, sore throat and voice change. Negative for ear pain, sinus pain and trouble swallowing.   Respiratory: Positive for cough (minimal, rare). Negative for shortness of breath and wheezing.   Cardiovascular: Negative for chest pain.    Objective:  Wt 218 lb (98.9 kg)    LMP 10/03/2008    BMI 33.15 kg/m   Weight: 218 lb (98.9 kg)   BP Readings from Last 3 Encounters:  02/20/20 120/80  01/13/20 124/78  01/07/20 120/76   Wt Readings from Last 3 Encounters:  03/27/20 218 lb (98.9 kg)  02/20/20 215 lb (97.5 kg)  01/13/20 215 lb (97.5 kg)    EXAM:  GENERAL: alert, oriented, appears well and in no acute distress  HEENT: atraumatic, conjunctiva clear, no obvious abnormalities on inspection of external nose and ears. She is very congested sounding, hoarse.  NECK: normal movements of the head and neck  LUNGS: on inspection no signs of respiratory distress, breathing rate appears normal, no obvious gross SOB, gasping or wheezing. Occasional cough, hacking.  CV: no obvious cyanosis  MS: moves all visible extremities without noticeable abnormality  PSYCH/NEURO: pleasant and cooperative, no obvious depression or anxiety, speech and thought processing grossly intact   Assessment/Plan  1. Acute sinusitis, recurrence not specified,  unspecified location We discussed that typically sinus infections are viral unless going on more than a couple of weeks. She did have some relief with guaifenesin so I encouraged her to do this regularly until congestion has improved, also encouraged nasal saline rinses. I did send in Augmentin, but with discussion that she will only start this if sx not improving at two weeks or she starts having worsening sx  at this point - like fever, sinus pain. She will return if concerns/not improving.   Return if symptoms worsen or fail to improve.     I discussed the assessment and treatment plan with the patient. The patient was provided an opportunity to ask questions and all were answered. The patient agreed with the plan and demonstrated an understanding of the instructions.   The patient was advised to call back or seek an in-person evaluation if the symptoms worsen or if the condition fails to improve as anticipated.  I provided 15 minutes of non-face-to-face time during this encounter.   Theodis Shove, MD

## 2020-03-30 ENCOUNTER — Telehealth: Payer: Self-pay | Admitting: Internal Medicine

## 2020-03-30 NOTE — Telephone Encounter (Signed)
Patient has a physical on 11.18.21 and was wondering if she could get orders for her labs before her visit.

## 2020-04-01 ENCOUNTER — Ambulatory Visit: Payer: Managed Care, Other (non HMO)

## 2020-04-02 NOTE — Telephone Encounter (Signed)
Sent to Dr. John. 

## 2020-04-04 NOTE — Telephone Encounter (Signed)
Please to let pt know, that her labs are already ordered since her last visit to be done at the [facility] lab   Ok to go there this time, and we may switch in future to the [facility]

## 2020-04-05 ENCOUNTER — Other Ambulatory Visit: Payer: Self-pay | Admitting: Cardiology

## 2020-04-06 NOTE — Telephone Encounter (Signed)
LDVM for pt to go to [facility] lab for lab work.

## 2020-04-08 ENCOUNTER — Other Ambulatory Visit: Payer: Managed Care, Other (non HMO)

## 2020-04-09 ENCOUNTER — Encounter: Payer: Managed Care, Other (non HMO) | Admitting: Internal Medicine

## 2020-04-12 ENCOUNTER — Ambulatory Visit: Payer: Managed Care, Other (non HMO) | Admitting: Endocrinology

## 2020-04-16 ENCOUNTER — Other Ambulatory Visit (INDEPENDENT_AMBULATORY_CARE_PROVIDER_SITE_OTHER): Payer: Managed Care, Other (non HMO)

## 2020-04-16 ENCOUNTER — Other Ambulatory Visit: Payer: Self-pay

## 2020-04-16 DIAGNOSIS — E063 Autoimmune thyroiditis: Secondary | ICD-10-CM

## 2020-04-16 LAB — TSH: TSH: 1.56 u[IU]/mL (ref 0.35–4.50)

## 2020-04-16 LAB — T4, FREE: Free T4: 1.28 ng/dL (ref 0.60–1.60)

## 2020-04-20 ENCOUNTER — Other Ambulatory Visit (INDEPENDENT_AMBULATORY_CARE_PROVIDER_SITE_OTHER): Payer: Managed Care, Other (non HMO)

## 2020-04-20 DIAGNOSIS — Z0001 Encounter for general adult medical examination with abnormal findings: Secondary | ICD-10-CM

## 2020-04-20 LAB — URINALYSIS, ROUTINE W REFLEX MICROSCOPIC
Bilirubin Urine: NEGATIVE
Hgb urine dipstick: NEGATIVE
Ketones, ur: NEGATIVE
Leukocytes,Ua: NEGATIVE
Nitrite: NEGATIVE
RBC / HPF: NONE SEEN (ref 0–?)
Specific Gravity, Urine: >=1.03 — AB (ref 1.000–1.030)
Total Protein, Urine: NEGATIVE
Urine Glucose: NEGATIVE
Urobilinogen, UA: 0.2 (ref 0.0–1.0)
pH: 5.5 (ref 5.0–8.0)

## 2020-04-20 LAB — BASIC METABOLIC PANEL
BUN: 8 mg/dL (ref 6–23)
CO2: 27 mEq/L (ref 19–32)
Calcium: 9.6 mg/dL (ref 8.4–10.5)
Chloride: 105 mEq/L (ref 96–112)
Creatinine, Ser: 0.72 mg/dL (ref 0.40–1.20)
GFR: 93.9 mL/min (ref >60.00)
Glucose, Bld: 92 mg/dL (ref 70–99)
Potassium: 4.2 mEq/L (ref 3.5–5.1)
Sodium: 140 mEq/L (ref 135–145)

## 2020-04-20 LAB — CBC WITH DIFFERENTIAL/PLATELET
Basophils Absolute: 0.1 10*3/uL (ref 0.0–0.1)
Basophils Relative: 0.6 % (ref 0.0–3.0)
Eosinophils Absolute: 0.3 10*3/uL (ref 0.0–0.7)
Eosinophils Relative: 3.8 % (ref 0.0–5.0)
HCT: 37.6 % (ref 36.0–46.0)
Hemoglobin: 12.6 g/dL (ref 12.0–15.0)
Lymphocytes Relative: 20.6 % (ref 12.0–46.0)
Lymphs Abs: 1.9 10*3/uL (ref 0.7–4.0)
MCHC: 33.4 g/dL (ref 30.0–36.0)
MCV: 94.2 fl (ref 78.0–100.0)
Monocytes Absolute: 0.6 10*3/uL (ref 0.1–1.0)
Monocytes Relative: 6.4 % (ref 3.0–12.0)
Neutro Abs: 6.2 10*3/uL (ref 1.4–7.7)
Neutrophils Relative %: 68.6 % (ref 43.0–77.0)
Platelets: 296 10*3/uL (ref 150.0–400.0)
RBC: 3.99 Mil/uL (ref 3.87–5.11)
RDW: 12.8 % (ref 11.5–15.5)
WBC: 9 10*3/uL (ref 4.0–10.5)

## 2020-04-20 LAB — LIPID PANEL
Cholesterol: 164 mg/dL (ref 0–200)
HDL: 64.9 mg/dL (ref >39.00)
LDL Cholesterol: 89 mg/dL (ref 0–99)
NonHDL: 99.54
Total CHOL/HDL Ratio: 3
Triglycerides: 54 mg/dL (ref 0.0–149.0)
VLDL: 10.8 mg/dL (ref 0.0–40.0)

## 2020-04-20 LAB — TSH: TSH: 0.99 u[IU]/mL (ref 0.35–4.50)

## 2020-04-20 LAB — HEPATIC FUNCTION PANEL
ALT: 22 U/L (ref 0–35)
AST: 17 U/L (ref 0–37)
Albumin: 4 g/dL (ref 3.5–5.2)
Alkaline Phosphatase: 67 U/L (ref 39–117)
Bilirubin, Direct: 0.2 mg/dL (ref 0.0–0.3)
Total Bilirubin: 1.2 mg/dL (ref 0.2–1.2)
Total Protein: 6.8 g/dL (ref 6.0–8.3)

## 2020-04-21 ENCOUNTER — Ambulatory Visit: Payer: Managed Care, Other (non HMO) | Admitting: Endocrinology

## 2020-04-22 ENCOUNTER — Encounter: Payer: Managed Care, Other (non HMO) | Admitting: Internal Medicine

## 2020-05-03 ENCOUNTER — Ambulatory Visit: Payer: Managed Care, Other (non HMO) | Admitting: Endocrinology

## 2020-05-05 ENCOUNTER — Other Ambulatory Visit: Payer: Self-pay | Admitting: Endocrinology

## 2020-05-06 ENCOUNTER — Other Ambulatory Visit: Payer: Self-pay | Admitting: *Deleted

## 2020-05-06 MED ORDER — LEVOTHYROXINE SODIUM 125 MCG PO TABS: 125.0000 ug | ORAL_TABLET | Freq: Every day | ORAL | 0 refills | Status: DC

## 2020-05-06 MED ORDER — LIOTHYRONINE SODIUM 5 MCG PO TABS: 5.0000 ug | ORAL_TABLET | Freq: Every day | ORAL | 1 refills | Status: DC

## 2020-05-07 ENCOUNTER — Encounter: Payer: Self-pay | Admitting: Internal Medicine

## 2020-05-07 ENCOUNTER — Telehealth (INDEPENDENT_AMBULATORY_CARE_PROVIDER_SITE_OTHER): Payer: Managed Care, Other (non HMO) | Admitting: Internal Medicine

## 2020-05-07 DIAGNOSIS — J309 Allergic rhinitis, unspecified: Secondary | ICD-10-CM | POA: Diagnosis not present

## 2020-05-07 DIAGNOSIS — F411 Generalized anxiety disorder: Secondary | ICD-10-CM | POA: Diagnosis not present

## 2020-05-07 DIAGNOSIS — J069 Acute upper respiratory infection, unspecified: Secondary | ICD-10-CM

## 2020-05-07 MED ORDER — DOXYCYCLINE HYCLATE 100 MG PO TABS: 100.0000 mg | ORAL_TABLET | Freq: Two times a day (BID) | ORAL | 0 refills | Status: DC

## 2020-05-08 ENCOUNTER — Encounter: Payer: Self-pay | Admitting: Internal Medicine

## 2020-05-08 NOTE — Assessment & Plan Note (Signed)
stable overall by history and exam, recent data reviewed with pt, and pt to continue medical treatment as before,  to f/u any worsening symptoms or concerns  

## 2020-05-08 NOTE — Assessment & Plan Note (Addendum)
Mild to mod, for antibx course - doxy,  to f/u any worsening symptoms or concerns  I spent 31 minutes in preparing to see the patient by review of recent labs, imaging and procedures, obtaining and reviewing separately obtained history, communicating with the patient and family or caregiver, ordering medications, tests or procedures, and documenting clinical information in the EHR including the differential Dx, treatment, and any further evaluation and other management of uri, allergy, and anxiety

## 2020-05-08 NOTE — Progress Notes (Signed)
Patient ID: Olivia Mendez, female   DOB: 10-Jul-1965, 54 y.o.   MRN: [id number]  Virtual Visit via Video Note  I connected with [name] on May 07 2020 at  2:00 PM EST by a video enabled telemedicine application and verified that I am speaking with the correct person using two identifiers.  Location of all participants today Patient: at home Provider: at office   I discussed the limitations of evaluation and management by telemedicine and the availability of in person appointments. The patient expressed understanding and agreed to proceed.  History of Present Illness:  Here with 2-3 days acute onset fever, facial pain, pressure, headache, general weakness and malaise, and greenish d/c, with mild ST and cough, but pt denies chest pain, wheezing, increased sob or doe, orthopnea, PND, increased LE swelling, palpitations, dizziness or syncope.  Symptoms mostly ST and hoarseness worse than anything else, along with ear and neck pain.  Persists despite amoxil x 7 days per UC, then later prednisone course per [facility].  Covid neg nov 27.  Denies worsening depressive symptoms, suicidal ideation, or panic; has ongoing anxiety, Past Medical History:  Diagnosis Date  . Allergic rhinitis 12/26/2014  . Aortic aneurysm (HCC) 2018  . Endometriosis 1990's  . Hypothyroid   . Migraines    Past Surgical History:  Procedure Laterality Date  . APPENDECTOMY    . CHOLECYSTECTOMY    . OTHER SURGICAL HISTORY     surgery for Endometriosis  . TEE WITHOUT CARDIOVERSION N/A 03/22/2017   Procedure: TRANSESOPHAGEAL ECHOCARDIOGRAM (TEE);  Surgeon: Yates Decamp, MD;  Location: [facility] ENDOSCOPY;  Service: Cardiovascular;  Laterality: N/A;    reports that she has never smoked. She has never used smokeless tobacco. She reports previous alcohol use. She reports that she does not use drugs. family history includes Cancer in her paternal grandmother; Diabetes in her mother; Healthy in her father; Heart disease in her maternal  grandfather and paternal grandfather; Multiple sclerosis in her maternal grandmother; Thyroid disease in her cousin and maternal aunt. Allergies  Allergen Reactions  . Vicodin [Hydrocodone-Acetaminophen] Nausea And Vomiting   Current Outpatient Medications on File Prior to Visit  Medication Sig Dispense Refill  . Cholecalciferol (VITAMIN D-3 PO) Take 2,000 Units by mouth.    . hydrOXYzine (ATARAX/VISTARIL) 25 MG tablet 1 tab po qhs for itching    . levothyroxine (SYNTHROID) 125 MCG tablet Take 1 tablet (125 mcg total) by mouth daily. 90 tablet 0  . liothyronine (CYTOMEL) 5 MCG tablet Take 1 tablet (5 mcg total) by mouth daily. 90 tablet 1  . losartan (COZAAR) 100 MG tablet TAKE 1 TABLET DAILY 90 tablet 3  . pantoprazole (PROTONIX) 40 MG tablet Take 1 tablet by mouth daily.  6  . vitamin B-12 (CYANOCOBALAMIN) 1000 MCG tablet Take 1,000 mcg by mouth daily.     No current facility-administered medications on file prior to visit.    Observations/Objective: Alert, NAD, appropriate mood and affect, resps normal, cn 2-12 intact, moves all 4s, no visible rash or swelling Lab Results  Component Value Date   WBC 9.0 04/20/2020   HGB 12.6 04/20/2020   HCT 37.6 04/20/2020   PLT 296.0 04/20/2020   GLUCOSE 92 04/20/2020   CHOL 164 04/20/2020   TRIG 54.0 04/20/2020   HDL 64.90 04/20/2020   LDLCALC 89 04/20/2020   ALT 22 04/20/2020   AST 17 04/20/2020   NA 140 04/20/2020   K 4.2 04/20/2020   CL 105 04/20/2020   CREATININE 0.72  04/20/2020   BUN 8 04/20/2020   CO2 27 04/20/2020   TSH 0.99 04/20/2020   INR 0.9 ratio 09/14/2008   HGBA1C 5.5 11/13/2014   Assessment and Plan: See notes  Follow Up Instructions: See notes   I discussed the assessment and treatment plan with the patient. The patient was provided an opportunity to ask questions and all were answered. The patient agreed with the plan and demonstrated an understanding of the instructions.   The patient was advised to call back  or seek an in-person evaluation if the symptoms worsen or if the condition fails to improve as anticipated.   Oliver Barre, MD

## 2020-05-08 NOTE — Patient Instructions (Signed)
Please take all new medication as prescribed 

## 2020-05-10 ENCOUNTER — Ambulatory Visit: Payer: Managed Care, Other (non HMO) | Admitting: Endocrinology

## 2020-05-13 ENCOUNTER — Other Ambulatory Visit: Payer: Self-pay | Admitting: Endocrinology

## 2020-05-24 ENCOUNTER — Encounter: Payer: Managed Care, Other (non HMO) | Admitting: Internal Medicine

## 2020-05-24 ENCOUNTER — Encounter: Payer: Self-pay | Admitting: Internal Medicine

## 2020-06-07 ENCOUNTER — Other Ambulatory Visit: Payer: Self-pay | Admitting: Endocrinology

## 2020-06-07 DIAGNOSIS — E559 Vitamin D deficiency, unspecified: Secondary | ICD-10-CM

## 2020-06-07 DIAGNOSIS — E039 Hypothyroidism, unspecified: Secondary | ICD-10-CM

## 2020-06-08 ENCOUNTER — Other Ambulatory Visit (INDEPENDENT_AMBULATORY_CARE_PROVIDER_SITE_OTHER): Payer: Managed Care, Other (non HMO)

## 2020-06-08 ENCOUNTER — Other Ambulatory Visit: Payer: Self-pay

## 2020-06-08 DIAGNOSIS — E039 Hypothyroidism, unspecified: Secondary | ICD-10-CM | POA: Diagnosis not present

## 2020-06-08 DIAGNOSIS — E559 Vitamin D deficiency, unspecified: Secondary | ICD-10-CM

## 2020-06-08 LAB — TSH: TSH: 2.47 u[IU]/mL (ref 0.35–4.50)

## 2020-06-08 LAB — VITAMIN D 25 HYDROXY (VIT D DEFICIENCY, FRACTURES): VITD: 23.29 ng/mL — ABNORMAL LOW (ref 30.00–100.00)

## 2020-06-08 LAB — T3, FREE: T3, Free: 3.4 pg/mL (ref 2.3–4.2)

## 2020-06-08 LAB — T4, FREE: Free T4: 1.06 ng/dL (ref 0.60–1.60)

## 2020-06-10 NOTE — Progress Notes (Signed)
Patient ID: Olivia Mendez, female   DOB: 07/11/1965, 55 y.o.   MRN: [id number]           Referring Physician: Oliver Barre  Reason for Appointment:  Hypothyroidism, follow-up visit    History of Present Illness:   Hypothyroidism was first diagnosed in 1994  Baseline consultation history: At the time of diagnosis patient was having symptoms of fatigue and hair loss.  Also had noticed a swelling in her neck and was found to have a goiter She does not think she had symptoms of, cold sensitivity, difficulty concentrating, dry skin, weight gain  She has been treated by various physicians over the last several years About 5 years or so ago she thinks she was continuing to have fatigue  and when seen by a new endocrinologist she was suggested trying levothyroxine and liothyronine separately However she is not clear whether she started feeling better with this, she may have felt a little less tired        The patient has been treated with either levothyroxine or a combination of levothyroxine and Cytomel  RECENT history: On her initial consultation in 8/19 she was complaining of fatigue She has had chronic cold intolerance  Previously taking 150 mcg of levothyroxine and 5 mcg of liothyronine The dose of levothyroxine was progressively reduced after the visit in 8/21 and now is getting 125 mcg from [facility] Also she did not have coverage for brand-name Synthroid and is back on generic now Unchanged on her last visit in 2/21  No complaints of fatigue She says that she does not feel any different with dosage changes of her thyroid medications Her weight has fluctuated based on her diet  She has been consistent with taking her levothyroxine and liothyronine together when she wakes up and does not drink her coffee till 30 minutes later  Does not take any calcium or iron containing vitamins at the same time; takes her Protonix at bedtime  Her TSH is now consistently normal at 2.5, previously was  down to 0.08          Patient's weight history is as follows:  Wt Readings from Last 3 Encounters:  06/11/20 228 lb 3.2 oz (103.5 kg)  03/27/20 218 lb (98.9 kg)  02/20/20 215 lb (97.5 kg)    Thyroid function results have been as follows:   Lab Results  Component Value Date   TSH 2.47 06/08/2020   TSH 0.99 04/20/2020   TSH 1.56 04/16/2020   TSH 0.16 (L) 02/18/2020   FREET4 1.06 06/08/2020   FREET4 1.28 04/16/2020   FREET4 1.31 02/18/2020   FREET4 1.49 12/31/2019   T3FREE 3.4 06/08/2020   T3FREE 3.3 07/07/2019   T3FREE 3.1 05/13/2019   Related to problem discussed today: See review of systems  Past Medical History:  Diagnosis Date  . Allergic rhinitis 12/26/2014  . Aortic aneurysm (HCC) 2018  . Endometriosis 1990's  . Hypothyroid   . Migraines     Past Surgical History:  Procedure Laterality Date  . APPENDECTOMY    . CHOLECYSTECTOMY    . OTHER SURGICAL HISTORY     surgery for Endometriosis  . TEE WITHOUT CARDIOVERSION N/A 03/22/2017   Procedure: TRANSESOPHAGEAL ECHOCARDIOGRAM (TEE);  Surgeon: Yates Decamp, MD;  Location: [facility] ENDOSCOPY;  Service: Cardiovascular;  Laterality: N/A;    Family History  Problem Relation Age of Onset  . Diabetes Mother   . Multiple sclerosis Maternal Grandmother   . Heart disease Maternal Grandfather   .  Cancer Paternal Grandmother        melanoma  . Heart disease Paternal Grandfather   . Healthy Father   . Thyroid disease Cousin   . Thyroid disease Maternal Aunt     Social History:  reports that she has never smoked. She has never used smokeless tobacco. She reports previous alcohol use. She reports that she does not use drugs.  Allergies:  Allergies  Allergen Reactions  . Vicodin [Hydrocodone-Acetaminophen] Nausea And Vomiting    Allergies as of 06/11/2020      Reactions   Vicodin [hydrocodone-acetaminophen] Nausea And Vomiting      Medication List       Accurate as of June 11, 2020  8:52 AM. If you have any  questions, ask your nurse or doctor.        B COMPLEX 100 PO Take by mouth.   doxycycline 100 MG tablet Commonly known as: VIBRA-TABS Take 1 tablet (100 mg total) by mouth 2 (two) times daily.   hydrOXYzine 25 MG tablet Commonly known as: ATARAX/VISTARIL 1 tab po qhs for itching   levothyroxine 125 MCG tablet Commonly known as: SYNTHROID Take 1 tablet (125 mcg total) by mouth daily.   Synthroid 137 MCG tablet Generic drug: levothyroxine TAKE 1 TABLET BY MOUTH DAILY BEFORE BREAKFAST.   liothyronine 5 MCG tablet Commonly known as: CYTOMEL Take 1 tablet (5 mcg total) by mouth daily.   losartan 100 MG tablet Commonly known as: COZAAR TAKE 1 TABLET DAILY   pantoprazole 40 MG tablet Commonly known as: PROTONIX Take 1 tablet by mouth daily.   vitamin B-12 1000 MCG tablet Commonly known as: CYANOCOBALAMIN Take 1,000 mcg by mouth daily.   VITAMIN D-3 PO Take 2,000 Units by mouth.         Review of Systems   She has had chronic reflux and takes her Protonix at bedtime      She has not been taking any B12 supplement since she thought she started having itching after starting this and vitamin D She is back to taking only B complex vitamins Her B12 level was low normal previously  VITAMIN D deficiency: She had been given 2000 units vitamin D3 by her PCP but previously also took 50,000 units weekly for possibly a month Her level is low again She takes her vitamin D in the morning without any significant food  Lab Results  Component Value Date   VD25OH 23.29 (L) 06/08/2020   VD25OH 20.71 (L) 04/09/2019     Followed by PCP for her hypertension, taking losartan 100 mg        Examination:    BP 126/84   Pulse 70   Ht 5\' 8"  (1.727 m)   Wt 228 lb 3.2 oz (103.5 kg)   LMP 10/03/2008   SpO2 97%   BMI 34.70 kg/m   Thyroid not enlarged   Assessment:  HYPOTHYROIDISM, primary, 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  She has been on a combination of levothyroxine  125 mcg and 5 mcg of liothyronine Her dose has been reduced over the last few months for no specific reason Even though she has gained a lot of weight she is still quite euthyroid on the reduced dose of levothyroxine Currently cannot afford brand-name Synthroid and is back on [facility] from [facility]  She really does not have much symptoms when she gets hypothyroid  Vitamin D deficiency: She is not adequately replaced but also is taking her vitamin D without food  PLAN:   She  will continue same regimen as above  She will take her vitamin D at dinnertime but increase the dose to at least 4000 units vitamin D3 and preferably go up to 5000 units when she gets her next supply  Vitamin B12 deficiency: We will recheck her labs on the next visit  There are no Patient Instructions on file for this visit.   Reather Littler 06/11/2020, 8:52 AM     Note: This office note was prepared with Dragon voice recognition system technology. Any transcriptional errors that result from this process are unintentional.    Reather Littler

## 2020-06-11 ENCOUNTER — Other Ambulatory Visit: Payer: Self-pay

## 2020-06-11 ENCOUNTER — Ambulatory Visit: Payer: Managed Care, Other (non HMO) | Admitting: Endocrinology

## 2020-06-11 ENCOUNTER — Encounter: Payer: Self-pay | Admitting: Endocrinology

## 2020-06-11 VITALS — BP 126/84 | HR 70 | Ht 68.0 in | Wt 228.2 lb

## 2020-06-11 DIAGNOSIS — E538 Deficiency of other specified B group vitamins: Secondary | ICD-10-CM

## 2020-06-11 DIAGNOSIS — E559 Vitamin D deficiency, unspecified: Secondary | ICD-10-CM | POA: Diagnosis not present

## 2020-06-11 DIAGNOSIS — E063 Autoimmune thyroiditis: Secondary | ICD-10-CM | POA: Diagnosis not present

## 2020-06-11 NOTE — Patient Instructions (Signed)
Vitamin D 4000 or 5000 units at dinner

## 2020-06-25 ENCOUNTER — Other Ambulatory Visit: Payer: Self-pay

## 2020-06-28 ENCOUNTER — Encounter: Payer: Managed Care, Other (non HMO) | Admitting: Internal Medicine

## 2020-07-06 ENCOUNTER — Other Ambulatory Visit: Payer: Self-pay | Admitting: *Deleted

## 2020-07-06 MED ORDER — LEVOTHYROXINE SODIUM 125 MCG PO TABS: 125.0000 ug | ORAL_TABLET | Freq: Every day | ORAL | 1 refills | Status: DC

## 2020-07-13 ENCOUNTER — Encounter: Payer: Self-pay | Admitting: Internal Medicine

## 2020-07-13 ENCOUNTER — Other Ambulatory Visit: Payer: Self-pay

## 2020-07-13 ENCOUNTER — Ambulatory Visit (INDEPENDENT_AMBULATORY_CARE_PROVIDER_SITE_OTHER): Payer: Managed Care, Other (non HMO) | Admitting: Internal Medicine

## 2020-07-13 VITALS — BP 124/76 | HR 72 | Temp 98.2°F | Ht 68.0 in | Wt 223.0 lb

## 2020-07-13 DIAGNOSIS — Z0000 Encounter for general adult medical examination without abnormal findings: Secondary | ICD-10-CM | POA: Diagnosis not present

## 2020-07-13 DIAGNOSIS — E7849 Other hyperlipidemia: Secondary | ICD-10-CM

## 2020-07-13 LAB — HEPATIC FUNCTION PANEL
ALT: 33 U/L (ref 0–35)
AST: 24 U/L (ref 0–37)
Albumin: 4.1 g/dL (ref 3.5–5.2)
Alkaline Phosphatase: 58 U/L (ref 39–117)
Bilirubin, Direct: 0.2 mg/dL (ref 0.0–0.3)
Total Bilirubin: 1 mg/dL (ref 0.2–1.2)
Total Protein: 6.8 g/dL (ref 6.0–8.3)

## 2020-07-13 LAB — URINALYSIS, ROUTINE W REFLEX MICROSCOPIC
Bilirubin Urine: NEGATIVE
Hgb urine dipstick: NEGATIVE
Ketones, ur: NEGATIVE
Leukocytes,Ua: NEGATIVE
Nitrite: NEGATIVE
RBC / HPF: NONE SEEN (ref 0–?)
Specific Gravity, Urine: >=1.03 — AB (ref 1.000–1.030)
Total Protein, Urine: NEGATIVE
Urine Glucose: NEGATIVE
Urobilinogen, UA: 0.2 (ref 0.0–1.0)
pH: 5 (ref 5.0–8.0)

## 2020-07-13 LAB — CBC WITH DIFFERENTIAL/PLATELET
Basophils Absolute: 0.1 10*3/uL (ref 0.0–0.1)
Basophils Relative: 1 % (ref 0.0–3.0)
Eosinophils Absolute: 0.3 10*3/uL (ref 0.0–0.7)
Eosinophils Relative: 4.8 % (ref 0.0–5.0)
HCT: 38.6 % (ref 36.0–46.0)
Hemoglobin: 13.1 g/dL (ref 12.0–15.0)
Lymphocytes Relative: 36.9 % (ref 12.0–46.0)
Lymphs Abs: 2.2 10*3/uL (ref 0.7–4.0)
MCHC: 33.8 g/dL (ref 30.0–36.0)
MCV: 93.5 fl (ref 78.0–100.0)
Monocytes Absolute: 0.4 10*3/uL (ref 0.1–1.0)
Monocytes Relative: 6.4 % (ref 3.0–12.0)
Neutro Abs: 3.1 10*3/uL (ref 1.4–7.7)
Neutrophils Relative %: 50.9 % (ref 43.0–77.0)
Platelets: 298 10*3/uL (ref 150.0–400.0)
RBC: 4.13 Mil/uL (ref 3.87–5.11)
RDW: 13.4 % (ref 11.5–15.5)
WBC: 6.1 10*3/uL (ref 4.0–10.5)

## 2020-07-13 LAB — BASIC METABOLIC PANEL
BUN: 11 mg/dL (ref 6–23)
CO2: 30 mEq/L (ref 19–32)
Calcium: 10 mg/dL (ref 8.4–10.5)
Chloride: 106 mEq/L (ref 96–112)
Creatinine, Ser: 0.69 mg/dL (ref 0.40–1.20)
GFR: 97.31 mL/min (ref >60.00)
Glucose, Bld: 93 mg/dL (ref 70–99)
Potassium: 4.1 mEq/L (ref 3.5–5.1)
Sodium: 140 mEq/L (ref 135–145)

## 2020-07-13 LAB — LIPID PANEL
Cholesterol: 161 mg/dL (ref 0–200)
HDL: 60.7 mg/dL (ref >39.00)
LDL Cholesterol: 87 mg/dL (ref 0–99)
NonHDL: 100.76
Total CHOL/HDL Ratio: 3
Triglycerides: 67 mg/dL (ref 0.0–149.0)
VLDL: 13.4 mg/dL (ref 0.0–40.0)

## 2020-07-13 NOTE — Patient Instructions (Addendum)
Please remember to go for your shingles shot #2  Please remember to schedule your screening mammogram at [facility]  Please continue all other medications as before, and refills have been done if requested.  Please have the pharmacy call with any other refills you may need.  Please continue your efforts at being more active, low cholesterol diet, and weight control.  You are otherwise up to date with prevention measures today.  Please keep your appointments with your specialists as you may have planned  Please go to the LAB at the blood drawing area for the tests to be done  You will be contacted by phone if any changes need to be made immediately.  Otherwise, you will receive a letter about your results with an explanation, but please check with MyChart first.  Please remember to sign up for MyChart if you have not done so, as this will be important to you in the future with finding out test results, communicating by private email, and scheduling acute appointments online when needed.  Please make an Appointment to return for your 1 year visit, or sooner if needed

## 2020-07-13 NOTE — Progress Notes (Signed)
Patient ID: Olivia Mendez, female   DOB: [birth year], 55 y.o.   MRN: [id number]         Chief Complaint:: wellness exam        HPI:  Olivia Mendez is a 55 y.o. female here for wellness exam; due for shingle shot #2, and mammogram.  Has appt In June with cardiology for Thoracic aorta f/u exam.  Taking vit d 5000 u and thyroid replacement per endo.  No new complaints  Pt denies chest pain, increased sob or doe, wheezing, orthopnea, PND, increased LE swelling, palpitations, dizziness or syncope.   Pt denies polydipsia, polyuria, denies focal neuro s/s   Wt Readings from Last 3 Encounters:  07/13/20 223 lb (101.2 kg)  06/11/20 228 lb 3.2 oz (103.5 kg)  03/27/20 218 lb (98.9 kg)   BP Readings from Last 3 Encounters:  07/13/20 124/76  06/11/20 126/84  02/20/20 120/80   Immunization History  Administered Date(s) Administered  . Influenza,inj,Quad PF,6+ Mos 02/20/2020  . Influenza-Unspecified 03/20/2019  . PFIZER(Purple Top)SARS-COV-2 Vaccination 08/18/2019, 09/08/2019, 04/11/2020  . Td 09/14/2008  . Tdap 12/05/2016   There are no preventive care reminders to display for this patient.  Past Medical History:  Diagnosis Date  . Allergic rhinitis 12/26/2014  . Aortic aneurysm (HCC) 2018  . Endometriosis 1990's  . Hypothyroid   . Migraines    Past Surgical History:  Procedure Laterality Date  . APPENDECTOMY    . CHOLECYSTECTOMY    . OTHER SURGICAL HISTORY     surgery for Endometriosis  . TEE WITHOUT CARDIOVERSION N/A 03/22/2017   Procedure: TRANSESOPHAGEAL ECHOCARDIOGRAM (TEE);  Surgeon: Yates Decamp, MD;  Location: [facility] ENDOSCOPY;  Service: Cardiovascular;  Laterality: N/A;    reports that she has never smoked. She has never used smokeless tobacco. She reports previous alcohol use. She reports that she does not use drugs. family history includes Cancer in her paternal grandmother; Diabetes in her mother; Healthy in her father; Heart disease in her maternal grandfather and paternal  grandfather; Multiple sclerosis in her maternal grandmother; Thyroid disease in her cousin and maternal aunt. Allergies  Allergen Reactions  . Vicodin [Hydrocodone-Acetaminophen] Nausea And Vomiting   Current Outpatient Medications on File Prior to Visit  Medication Sig Dispense Refill  . B Complex Vitamins (B COMPLEX 100 PO) Take by mouth.    . Cholecalciferol (VITAMIN D-3 PO) Take 2,000 Units by mouth.    . levothyroxine (SYNTHROID) 125 MCG tablet Take 1 tablet (125 mcg total) by mouth daily. 90 tablet 1  . liothyronine (CYTOMEL) 5 MCG tablet Take 1 tablet (5 mcg total) by mouth daily. 90 tablet 1  . losartan (COZAAR) 100 MG tablet TAKE 1 TABLET DAILY 90 tablet 3  . pantoprazole (PROTONIX) 40 MG tablet Take 1 tablet by mouth daily.  6  . hydrOXYzine (ATARAX/VISTARIL) 25 MG tablet 1 tab po qhs for itching (Patient not taking: Reported on 07/13/2020)     No current facility-administered medications on file prior to visit.        ROS:  All others reviewed and negative.  Objective        PE:  BP 124/76   Pulse 72   Temp 98.2 F (36.8 C) (Oral)   Ht 5\' 8"  (1.727 m)   Wt 223 lb (101.2 kg)   LMP 10/03/2008   SpO2 99%   BMI 33.91 kg/m                 Constitutional: Pt appears in NAD  HENT: Head: NCAT.                Right Ear: External ear normal.                 Left Ear: External ear normal.                Eyes: . Pupils are equal, round, and reactive to light. Conjunctivae and EOM are normal               Nose: without d/c or deformity               Neck: Neck supple. Gross normal ROM               Cardiovascular: Normal rate and regular rhythm.                 Pulmonary/Chest: Effort normal and breath sounds without rales or wheezing.                Abd:  Soft, NT, ND, + BS, no organomegaly               Neurological: Pt is alert. At baseline orientation, motor grossly intact               Skin: Skin is warm. No rashes, no other new lesions, LE edema - none                Psychiatric: Pt behavior is normal without agitation   Micro: none  Cardiac tracings I have personally interpreted today:  none  Pertinent Radiological findings (summarize): none   Lab Results  Component Value Date   WBC 6.1 07/13/2020   HGB 13.1 07/13/2020   HCT 38.6 07/13/2020   PLT 298.0 07/13/2020   GLUCOSE 93 07/13/2020   CHOL 161 07/13/2020   TRIG 67.0 07/13/2020   HDL 60.70 07/13/2020   LDLCALC 87 07/13/2020   ALT 33 07/13/2020   AST 24 07/13/2020   NA 140 07/13/2020   K 4.1 07/13/2020   CL 106 07/13/2020   CREATININE 0.69 07/13/2020   BUN 11 07/13/2020   CO2 30 07/13/2020   TSH 2.47 06/08/2020   INR 0.9 ratio 09/14/2008   HGBA1C 5.5 11/13/2014   Assessment/Plan:  Olivia Mendez is a 55 y.o. White or Caucasian [1] female with  has a past medical history of Allergic rhinitis (12/26/2014), Aortic aneurysm (HCC) (2018), Endometriosis (1990's), Hypothyroid, and Migraines. Preventative health care Age and sex appropriate education and counseling updated with regular exercise and diet Referrals for preventative services - for mammogram Immunizations addressed - for shingrix #2 Smoking counseling  - none needed Evidence for depression or other mood disorder - none significant Most recent labs reviewed. I have personally reviewed and have noted: 1) the patient's medical and social history 2) The patient's current medications and supplements 3) The patient's height, weight, and BMI have been recorded in the chart   HLD (hyperlipidemia) Lab Results  Component Value Date   LDLCALC 87 07/13/2020   Stable, pt to continue current diet   Followup: Return in about 1 year (around 07/13/2021).  Oliver Barre, MD 07/22/2020 9:22 PM [facility] Medicine

## 2020-07-22 ENCOUNTER — Encounter: Payer: Self-pay | Admitting: Internal Medicine

## 2020-07-22 NOTE — Assessment & Plan Note (Signed)
Age and sex appropriate education and counseling updated with regular exercise and diet Referrals for preventative services - for mammogram Immunizations addressed - for shingrix #2 Smoking counseling  - none needed Evidence for depression or other mood disorder - none significant Most recent labs reviewed. I have personally reviewed and have noted: 1) the patient's medical and social history 2) The patient's current medications and supplements 3) The patient's height, weight, and BMI have been recorded in the chart

## 2020-07-22 NOTE — Assessment & Plan Note (Signed)
Lab Results  Component Value Date   LDLCALC 87 07/13/2020   Stable, pt to continue current diet

## 2020-11-08 ENCOUNTER — Ambulatory Visit: Payer: Managed Care, Other (non HMO) | Admitting: Cardiology

## 2020-11-09 ENCOUNTER — Other Ambulatory Visit (INDEPENDENT_AMBULATORY_CARE_PROVIDER_SITE_OTHER): Payer: Managed Care, Other (non HMO)

## 2020-11-09 ENCOUNTER — Other Ambulatory Visit: Payer: Self-pay

## 2020-11-09 DIAGNOSIS — E538 Deficiency of other specified B group vitamins: Secondary | ICD-10-CM | POA: Diagnosis not present

## 2020-11-09 DIAGNOSIS — E559 Vitamin D deficiency, unspecified: Secondary | ICD-10-CM | POA: Diagnosis not present

## 2020-11-09 DIAGNOSIS — E063 Autoimmune thyroiditis: Secondary | ICD-10-CM

## 2020-11-09 LAB — VITAMIN B12: Vitamin B-12: 314 pg/mL (ref 211–911)

## 2020-11-09 LAB — TSH: TSH: 1.07 u[IU]/mL (ref 0.35–4.50)

## 2020-11-09 LAB — VITAMIN D 25 HYDROXY (VIT D DEFICIENCY, FRACTURES): VITD: 36.92 ng/mL (ref 30.00–100.00)

## 2020-11-09 LAB — T4, FREE: Free T4: 1.13 ng/dL (ref 0.60–1.60)

## 2020-11-11 NOTE — Progress Notes (Signed)
This encounter was created in error - please disregard.

## 2020-11-12 ENCOUNTER — Encounter: Payer: Managed Care, Other (non HMO) | Admitting: Endocrinology

## 2020-11-17 ENCOUNTER — Other Ambulatory Visit: Payer: Self-pay | Admitting: Endocrinology

## 2020-11-30 ENCOUNTER — Ambulatory Visit: Payer: Managed Care, Other (non HMO) | Admitting: Endocrinology

## 2020-11-30 ENCOUNTER — Other Ambulatory Visit: Payer: Self-pay | Admitting: Internal Medicine

## 2020-11-30 ENCOUNTER — Encounter: Payer: Self-pay | Admitting: Endocrinology

## 2020-11-30 ENCOUNTER — Other Ambulatory Visit: Payer: Self-pay

## 2020-11-30 VITALS — BP 130/82 | HR 56 | Ht 68.0 in | Wt 229.2 lb

## 2020-11-30 DIAGNOSIS — Z1231 Encounter for screening mammogram for malignant neoplasm of breast: Secondary | ICD-10-CM

## 2020-11-30 DIAGNOSIS — E538 Deficiency of other specified B group vitamins: Secondary | ICD-10-CM

## 2020-11-30 DIAGNOSIS — E063 Autoimmune thyroiditis: Secondary | ICD-10-CM | POA: Diagnosis not present

## 2020-11-30 DIAGNOSIS — E559 Vitamin D deficiency, unspecified: Secondary | ICD-10-CM

## 2020-11-30 NOTE — Progress Notes (Signed)
Patient ID: Olivia Mendez, female   DOB: 07-Jan-1966, 55 y.o.   MRN: [id number]           Referring Physician: Oliver Barre  Reason for Appointment:  follow-up visit    History of Present Illness:   Hypothyroidism was first diagnosed in 1994  Baseline consultation history: At the time of diagnosis patient was having symptoms of fatigue and hair loss.  Also had noticed a swelling in her neck and was found to have a goiter She does not think she had symptoms of, cold sensitivity, difficulty concentrating, dry skin, weight gain  She had been treated by various physicians over the last several years Around 2014 she thinks she was continuing to have fatigue  and when seen by a new endocrinologist she was suggested trying levothyroxine and liothyronine separately However she is not clear whether she started feeling better with this, she may have felt a little less tired        The patient has been treated with either levothyroxine or a combination of levothyroxine and Cytomel  RECENT history: On her initial consultation in 8/19 she was complaining of fatigue She has had chronic cold intolerance  The dose of levothyroxine was progressively reduced after the visit in 8/21 and now is getting 125 mcg from [facility] Also she did not have coverage for brand-name Synthroid use previously Also on generic LIOTHYRONINE 5 mcg daily  No complaints of fatigue recently and usually does not feel much difference with adjusting her dosage No heat or cold intolerance and no hair loss  She has been regular with taking her levothyroxine and liothyronine when she wakes up and does not drink her coffee till 30 minutes later  Does not take any calcium or iron containing vitamins at the same time; takes her Protonix at bedtime  Her TSH is now consistently normal along with T4 and T3 levels         Patient's weight history is as follows:  Wt Readings from Last 3 Encounters:  11/30/20 229 lb 3.2 oz (104 kg)   07/13/20 223 lb (101.2 kg)  06/11/20 228 lb 3.2 oz (103.5 kg)    Thyroid function results have been as follows:   Lab Results  Component Value Date   TSH 1.07 11/09/2020   TSH 2.47 06/08/2020   TSH 0.99 04/20/2020   TSH 1.56 04/16/2020   FREET4 1.13 11/09/2020   FREET4 1.06 06/08/2020   FREET4 1.28 04/16/2020   FREET4 1.31 02/18/2020   T3FREE 3.4 06/08/2020   T3FREE 3.3 07/07/2019   T3FREE 3.1 05/13/2019   Related to problem discussed today: See review of systems  Past Medical History:  Diagnosis Date   Allergic rhinitis 12/26/2014   Aortic aneurysm (HCC) 2018   Endometriosis 1990's   Hypothyroid    Migraines     Past Surgical History:  Procedure Laterality Date   APPENDECTOMY     CHOLECYSTECTOMY     OTHER SURGICAL HISTORY     surgery for Endometriosis   TEE WITHOUT CARDIOVERSION N/A 03/22/2017   Procedure: TRANSESOPHAGEAL ECHOCARDIOGRAM (TEE);  Surgeon: Yates Decamp, MD;  Location: [facility] ENDOSCOPY;  Service: Cardiovascular;  Laterality: N/A;    Family History  Problem Relation Age of Onset   Diabetes Mother    Multiple sclerosis Maternal Grandmother    Heart disease Maternal Grandfather    Cancer Paternal Grandmother        melanoma   Heart disease Paternal Grandfather    Healthy Father  Thyroid disease Cousin    Thyroid disease Maternal Aunt     Social History:  reports that she has never smoked. She has never used smokeless tobacco. She reports previous alcohol use. She reports that she does not use drugs.  Allergies:  Allergies  Allergen Reactions   Vicodin [Hydrocodone-Acetaminophen] Nausea And Vomiting    Allergies as of 11/30/2020       Reactions   Vicodin [hydrocodone-acetaminophen] Nausea And Vomiting        Medication List        Accurate as of November 30, 2020 11:01 AM. If you have any questions, ask your nurse or doctor.          B COMPLEX 100 PO Take by mouth.   hydrOXYzine 25 MG tablet Commonly known as: ATARAX/VISTARIL 1  tab po qhs for itching   levothyroxine 125 MCG tablet Commonly known as: SYNTHROID Take 1 tablet (125 mcg total) by mouth daily.   liothyronine 5 MCG tablet Commonly known as: CYTOMEL TAKE 1 TABLET DAILY   losartan 100 MG tablet Commonly known as: COZAAR TAKE 1 TABLET DAILY   pantoprazole 40 MG tablet Commonly known as: PROTONIX Take 1 tablet by mouth daily.   VITAMIN D-3 PO Take 2,000 Units by mouth.          Review of Systems   She has had chronic reflux and takes her Protonix at bedtime      Vitamin B12 deficiency: She previously had stopped B12 supplement since she thought she started having itching after starting this and vitamin D She is back to taking only B complex vitamins Her B12 level was low normal previously, now improved  Lab Results  Component Value Date   VITAMINB12 314 11/09/2020     VITAMIN D deficiency: She had been given 5000 units vitamin D3 which she now takes in the evenings with food  Vitamin D level is now finally normal  Lab Results  Component Value Date   VD25OH 36.92 11/09/2020   VD25OH 23.29 (L) 06/08/2020   VD25OH 20.71 (L) 04/09/2019     Followed by PCP for her hypertension, taking losartan 100 mg        Examination:    BP 130/82   Pulse (!) 56   Ht 5\' 8"  (1.727 m)   Wt 229 lb 3.2 oz (104 kg)   LMP 10/03/2008   SpO2 98%   BMI 34.85 kg/m      Assessment:  HYPOTHYROIDISM, primary, 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  She has been on a combination of levothyroxine 125 mcg and 5 mcg of liothyronine  She gets her levothyroxine from [facility] Clinically doing well and her TSH is consistently normal  Vitamin D deficiency: She is now adequately replaced with 5000 units vitamin D3 taken in the evening with food  Also appears to have therapeutic vitamin B12 levels with taking a B complex that she prefers  PLAN:   She will continue same thyroid regimen as above, discussed timing of taking the medications She  will try to get the same manufacturer from her mail order company for levothyroxine  Also continue same dose of vitamin D   There are no Patient Instructions on file for this visit.   12/03/2008 11/30/2020, 11:01 AM     Note: This office note was prepared with Dragon voice recognition system technology. Any transcriptional errors that result from this process are unintentional.    12/02/2020

## 2020-12-03 ENCOUNTER — Ambulatory Visit: Payer: Managed Care, Other (non HMO) | Admitting: Cardiology

## 2020-12-03 ENCOUNTER — Other Ambulatory Visit: Payer: Self-pay

## 2020-12-03 ENCOUNTER — Encounter: Payer: Self-pay | Admitting: Cardiology

## 2020-12-03 VITALS — BP 131/88 | HR 74 | Temp 98.2°F | Resp 16 | Ht 68.0 in | Wt 229.4 lb

## 2020-12-03 DIAGNOSIS — Q263 Partial anomalous pulmonary venous connection: Secondary | ICD-10-CM

## 2020-12-03 DIAGNOSIS — I7121 Aneurysm of the ascending aorta, without rupture: Secondary | ICD-10-CM

## 2020-12-03 DIAGNOSIS — Q231 Congenital insufficiency of aortic valve: Secondary | ICD-10-CM

## 2020-12-03 NOTE — Progress Notes (Signed)
Primary Physician/Referring:  Corwin Levins, MD  Patient ID: Olivia Mendez, female    DOB: 09/14/1965, 55 y.o.   MRN: [id number]  Chief Complaint  Patient presents with   Aortic Root Dilation   Bicuspid valve   Follow-up    1 year   HPI:    Olivia Mendez  is a 55 y.o. 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  Denies chest pain or shortness of breath.  Tolerating losartan well.  Past Medical History:  Diagnosis Date   Allergic rhinitis 12/26/2014   Aortic aneurysm (HCC) 2018   Endometriosis 1990's   Hypothyroid    Migraines    Past Surgical History:  Procedure Laterality Date   APPENDECTOMY     CHOLECYSTECTOMY     OTHER SURGICAL HISTORY     surgery for Endometriosis   TEE WITHOUT CARDIOVERSION N/A 03/22/2017   Procedure: TRANSESOPHAGEAL ECHOCARDIOGRAM (TEE);  Surgeon: Yates Decamp, MD;  Location[facility] ENDOSCOPY;  Service: Cardiovascular;  Laterality: N/A;   Family History  Problem Relation Age of Onset   Diabetes Mother    Multiple sclerosis Maternal Grandmother    Heart disease Maternal Grandfather    Cancer Paternal Grandmother        melanoma   Heart disease Paternal Grandfather    Healthy Father    Thyroid disease Cousin    Thyroid disease Maternal Aunt     Social History   Tobacco Use   Smoking status: Never   Smokeless tobacco: Never  Substance Use Topics   Alcohol use: Not Currently    Alcohol/week: 0.0 standard drinks   Marital Status: Married  ROS  Review of Systems  Cardiovascular:  Negative for dyspnea on exertion, leg swelling and syncope.  Gastrointestinal:  Negative for melena.   Objective  Blood pressure 131/88, pulse 74, temperature 98.2 F (36.8 C), temperature source Temporal, resp. rate 16, height 5\' 8"  (1.727 m), weight 229 lb 6.4 oz (104.1 kg), last menstrual period  10/03/2008, SpO2 97 %.   Vitals with BMI 12/03/2020 11/30/2020 07/13/2020  Height 5\' 8"  5\' 8"  5\' 8"   Weight 229 lbs 6 oz 229 lbs 3 oz 223 lbs  BMI 34.89 34.86 33.91  Systolic 131 130 09/10/2020  Diastolic 88 82 76  Pulse 74 56 72     Physical Exam Constitutional:      General: She is not in acute distress.    Appearance: She is well-developed.     Comments: Mildly obese  HENT:     Head: Atraumatic.  Cardiovascular:     Rate and Rhythm: Normal rate and regular rhythm.     Pulses: Normal pulses and intact distal pulses.     Heart sounds: S1 normal and S2 normal. Murmur heard.  Early systolic murmur is present with a grade of 2/6 at the upper right sternal border.    No gallop.     Comments: No leg edema, no JVD.  Pulmonary:     Effort: Pulmonary effort is normal. No accessory muscle usage.     Breath sounds: Normal breath sounds.  Abdominal:     General: Bowel sounds are normal.     Palpations: Abdomen is soft.   Laboratory examination:   Recent Labs    04/20/20 0831 07/13/20 0955  NA 140 140  K 4.2 4.1  CL 105 106  CO2 27  30  GLUCOSE 92 93  BUN 8 11  CREATININE 0.72 0.69  CALCIUM 9.6 10.0   CrCl cannot be calculated (Patient's most recent lab result is older than the maximum 21 days allowed.).  CMP Latest Ref Rng & Units 07/13/2020 04/20/2020 04/09/2019  Glucose 70 - 99 mg/dL 93 92 88  BUN 6 - 23 mg/dL 11 8 14   Creatinine 0.40 - 1.20 mg/dL 9.67 8.93  Sodium 135 - 145 mEq/L 140 140 139  Potassium 3.5 - 5.1 mEq/L 4.1 4.2 4.5  Chloride 96 - 112 mEq/L 106 105 103  CO2 19 - 32 mEq/L 30 27 28   Calcium 8.4 - 10.5 mg/dL 8.10 9.6 9.8  Total Protein 6.0 - 8.3 g/dL 6.8 6.8 7.1  Total Bilirubin 0.2 - 1.2 mg/dL 1.0 1.2 1.1  Alkaline Phos 39 - 117 U/L 58 67 71  AST 0 - 37 U/L 24 17 17   ALT 0 - 35 U/L 33 22 24   CBC Latest Ref Rng & Units 07/13/2020 04/20/2020 04/09/2019  WBC 4.0 - 10.5 K/uL 6.1 9.0 7.1  Hemoglobin 12.0 - 15.0 g/dL 09/10/2020 04/22/2020 13/09/2018  Hematocrit 36.0 - 46.0 % 38.6  37.6 40.8  Platelets 150.0 - 400.0 K/uL 298.0 296.0 286.0   Lipid Panel Recent Labs    04/20/20 0831 07/13/20 0955  CHOL 164 161  TRIG 54.0 67.0  LDLCALC 89 87  VLDL 10.8 13.4  HDL 64.90 60.70  CHOLHDL 3 3       Component Value Date/Time   CHOL 161 07/13/2020 0955   TRIG 67.0 07/13/2020 0955   HDL 60.70 07/13/2020 0955   CHOLHDL 3 07/13/2020 0955   VLDL 13.4 07/13/2020 0955   LDLCALC 87 07/13/2020 0955   HEMOGLOBIN A1C Lab Results  Component Value Date   HGBA1C 5.5 11/13/2014   TSH Recent Labs    04/20/20 0831 06/08/20 0813 11/09/20 0845  TSH 0.99 2.47 1.07   Medications and allergies   Allergies  Allergen Reactions   Vicodin [Hydrocodone-Acetaminophen] Nausea And Vomiting    Current Outpatient Medications  Medication Instructions   B Complex Vitamins (B COMPLEX 100 PO) Oral   Cholecalciferol (VITAMIN D-3 PO) 2,000 Units, Oral   levothyroxine (SYNTHROID) 125 mcg, Oral, Daily   liothyronine (CYTOMEL) 5 MCG tablet TAKE 1 TABLET DAILY   losartan (COZAAR) 100 MG tablet TAKE 1 TABLET DAILY   Omega-3 Fatty Acids (FISH OIL) 1000 MG CAPS Oral   pantoprazole (PROTONIX) 40 MG tablet 1 tablet, Oral, Daily   Radiology:   See below  Cardiac Studies:   Treadmill stress test 31-Mar-2017: Indication: Chest pain The patient exercised on Bruce protocol. Exercise time was 06:59 min . Patient achieved maximum HR of 158 BPM. The target heart rate was 111%, 85% of 94% . Resting EKG demonstrated NSR. Stress EKG: ST Changes: Depression upsloping, noted at pea/k exercise but back to baseline immediately into recovery at < 1 minute. Arrhythmias: ventricular premature beats-isolated. Chest Pain: non-limiting. BP Response to Exercise: Normal resting BP- appropriate response. HR Response to Exercise: Appropriat.e. Exercise capacity was normal. Overall Impression: Normal stress test. Exercise capacity was normal. Suspect GERD to be etiology for cough and chest pain with exercise.  Clinical correlation recommended. Low risk stress EKG.  Echocardiogram 11/04/2018:  Normal LV systolic function with EF 57%. Left ventricle cavity is normal in size. Normal global wall motion. Normal diastolic filling pattern. Calculated EF 57%. Right ventricle cavity is borderline dilated. Normal right ventricular function. Not well visualized. Possible Bicuspid aortic  valve with no regurgitation noted. Mildly restricted aortic valve leaflets. Trace aortic valve stenosis. Aortic valve peak pressure gradient of  20 and mean gradient of 10 mmHg, calculated aortic valve area 1.83 cm. Structurally normal mitral valve. Mild (Grade I) mitral regurgitation. The aortic root is mildly dilated at 3.8 cm. IVC is dilated with respiratory variation. This may suggest elevated right heart pressure No significant change from 09/04/2017.   TEE 03/22/2017: Normal LV. Bicuspid aortic valve without any thickening or calcification. Aortic root dilated at 4 cm.  CT angiogram of the chest 11/05/2019: Comparison 03/13/2018. 1. Stable aneurysmal disease of the ascending thoracic aorta measuring up to 4.2 cm in maximum caliber. Recommend annual imaging followup by CTA or MRA. This recommendation follows 2010 ACCF/AHA/AATS/ACR/ASA/SCA/SCAI/SIR/STS/SVM Guidelines for the Diagnosis and Management of Patients with Thoracic Aortic Disease. Circulation. 2010; 121: E703-J009. Aortic aneurysm NOS (ICD10-I71.9) 2. Stable partial anomalous pulmonary venous drainage on the left with the left superior pulmonary vein draining into the left brachiocephalic vein. This can be a cause of hypoxia, especially with exertion. 3. Subtle area of ill-defined ground-glass density in the right upper lobe was probably present on the prior study but appears more conspicuous/prominent. This may simply be a benign area of scarring but can be followed on subsequent scans. 4. Stable hepatic steatosis.  EKG  EKG 12/03/2020: Normal sinus rhythm at rate  of 73 bpm, normal axis.  Incomplete right bundle branch block.  Normal EKG.  No significant change from 11/07/2019.  Assessment     ICD-10-CM   1. Bicuspid aortic valve  Q23.1 EKG 12-Lead    PCV ECHOCARDIOGRAM COMPLETE    2. Ascending aortic aneurysm (HCC)  I71.2 PCV ECHOCARDIOGRAM COMPLETE    3. Partial anomalous pulmonary venous return (PAPVR)  Q26.3        Recommendations:   Olivia Mendez  is a 55 y.o. 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  She is tolerating losartan 100 mg daily without any side effects.  Her lipids are normal, blood pressure is normal and no change in physical exam with regard to murmur.  Her ascending aortic aneurysm is small, I would like to repeat echocardiogram and alternate her CT scans to reduce radiation exposure.  Although she has anomalous pulmonary venous connection, she is completely asymptomatic and no therapy is indicated with regard to this.  Weight loss was discussed with the patient.  Otherwise I did not make any changes to her regimen.  I will see her back on annual basis and will consider doing the CT scan/CT angiogram of the chest next year.     Yates Decamp, MD, [facility] 12/03/2020, 4:00 PM Office: [phone] Fax: [phone] Pager: [phone]

## 2020-12-09 ENCOUNTER — Ambulatory Visit: Payer: Managed Care, Other (non HMO)

## 2020-12-09 ENCOUNTER — Other Ambulatory Visit: Payer: Self-pay

## 2020-12-09 DIAGNOSIS — I712 Thoracic aortic aneurysm, without rupture, unspecified: Secondary | ICD-10-CM

## 2020-12-09 DIAGNOSIS — I7121 Aneurysm of the ascending aorta, without rupture: Secondary | ICD-10-CM

## 2020-12-09 DIAGNOSIS — Q231 Congenital insufficiency of aortic valve: Secondary | ICD-10-CM

## 2020-12-14 ENCOUNTER — Encounter: Payer: Self-pay | Admitting: Internal Medicine

## 2020-12-14 DIAGNOSIS — Q231 Congenital insufficiency of aortic valve: Secondary | ICD-10-CM

## 2020-12-14 DIAGNOSIS — Q2381 Bicuspid aortic valve: Secondary | ICD-10-CM | POA: Insufficient documentation

## 2020-12-14 HISTORY — DX: Congenital insufficiency of aortic valve: Q23.1

## 2020-12-14 HISTORY — DX: Bicuspid aortic valve: Q23.81

## 2020-12-16 NOTE — Progress Notes (Addendum)
Echocardiogram 12/10/2020: Normal LV systolic function with EF 62%. Left ventricle cavity is normal in size. Normal left ventricular wall thickness. Normal global wall motion. Normal diastolic filling pattern. Calculated EF 62%. AV is not well visualized. Leaflet not visible. Probably bicuspid aortic valve in long axis views with no regurgitation. Trace aortic valve stenosis. The aortic root is normal. Mildly dilated ascending aorta at 4.2 cm. Compared to the study done on 11/04/2018, aortic root was measured at 3.8 however patient has known bicuspid aortic valve by TEE and CT angiogram and revealed a 4.2 cm ascending aortic aneurysm.  Clinical correlation recommended.  I will order CTA next year prior her visit and repeat echo as well for comparison.    ICD-10-CM   1. Bicuspid aortic valve  Q23.1 PCV ECHOCARDIOGRAM COMPLETE    PCV ECHOCARDIOGRAM COMPLETE    2. Ascending aortic aneurysm (HCC)  I71.2 PCV ECHOCARDIOGRAM COMPLETE    3. Thoracic aortic aneurysm without rupture (HCC)  I71.2 CT ANGIO CHEST AORTA W/CM & OR WO/CM    PCV ECHOCARDIOGRAM COMPLETE     Orders Placed This Encounter  Procedures   CT ANGIO CHEST AORTA W/CM & OR WO/CM    Standing Status:   Future    Standing Expiration Date:   12/16/2021    Order Specific Question:   If indicated for the ordered procedure, I authorize the administration of contrast media per Radiology protocol    Answer:   Yes    Order Specific Question:   Preferred imaging location?    Answer:   [id number]    Order Specific Question:   Is patient pregnant?    Answer:   No   PCV ECHOCARDIOGRAM COMPLETE    Standing Status:   Future    Standing Expiration Date:   12/16/2021    Yates Decamp, MD, [facility] 12/16/2020, 5:42 AM Office: [phone] Fax: [phone] Pager: [phone]

## 2021-01-09 ENCOUNTER — Other Ambulatory Visit: Payer: Self-pay | Admitting: Endocrinology

## 2021-01-21 ENCOUNTER — Ambulatory Visit
Admission: RE | Admit: 2021-01-21 | Discharge: 2021-01-21 | Disposition: A | Discharge status: Home / Self Care | Payer: Managed Care, Other (non HMO) | Source: Ambulatory Visit | Attending: Internal Medicine | Admitting: Internal Medicine

## 2021-01-21 ENCOUNTER — Other Ambulatory Visit: Payer: Self-pay

## 2021-01-21 DIAGNOSIS — Z1231 Encounter for screening mammogram for malignant neoplasm of breast: Secondary | ICD-10-CM

## 2021-01-26 ENCOUNTER — Ambulatory Visit
Admission: RE | Admit: 2021-01-26 | Discharge: 2021-01-26 | Disposition: A | Discharge status: Home / Self Care | Payer: Managed Care, Other (non HMO) | Source: Ambulatory Visit | Attending: Emergency Medicine | Admitting: Emergency Medicine

## 2021-01-26 ENCOUNTER — Other Ambulatory Visit: Payer: Self-pay

## 2021-01-26 ENCOUNTER — Other Ambulatory Visit: Payer: Self-pay | Admitting: Internal Medicine

## 2021-01-26 VITALS — BP 126/90 | HR 64 | Temp 98.2°F | Resp 18

## 2021-01-26 DIAGNOSIS — Z1152 Encounter for screening for COVID-19: Secondary | ICD-10-CM | POA: Diagnosis not present

## 2021-01-26 DIAGNOSIS — R928 Other abnormal and inconclusive findings on diagnostic imaging of breast: Secondary | ICD-10-CM

## 2021-01-26 DIAGNOSIS — B349 Viral infection, unspecified: Secondary | ICD-10-CM | POA: Diagnosis not present

## 2021-01-26 MED ORDER — PREDNISONE 10 MG PO TABS: ORAL_TABLET | ORAL | 0 refills | Status: AC

## 2021-01-26 NOTE — Discharge Instructions (Addendum)

## 2021-01-26 NOTE — ED Triage Notes (Signed)
Pt here with sore throat, congestion, and ear/sinus pressure. No fever.

## 2021-01-26 NOTE — ED Provider Notes (Addendum)
CHIEF COMPLAINT:   Chief Complaint  Patient presents with   URI     SUBJECTIVE/HPI:   URI A very pleasant 55 y.o.Female presents today with sore throat, diarrhea, congestion, ear/sinus pressure which started on Monday.  Patient reports that typically if she can "head off" her illness she is able to manage with prednisone without having to use antibiotics.  Patient reports that this typically happens about once a year. Patient does not report any shortness of breath, chest pain, palpitations, visual changes, weakness, tingling, headache, nausea, vomiting, diarrhea, fever, chills.   has a past medical history of Allergic rhinitis (12/26/2014), Aortic aneurysm (HCC) (2018), Bicuspid aortic valve (12/14/2020), Endometriosis (1990's), Hypothyroid, and Migraines.  ROS:  Review of Systems See Subjective/HPI Medications, Allergies and Problem List personally reviewed in Epic today OBJECTIVE:   Vitals:   01/26/21 0825  BP: 126/90  Pulse: 64  Resp: 18  Temp: 98.2 F (36.8 C)  SpO2: 97%    Physical Exam   General: Appears well-developed and well-nourished. No acute distress.  HEENT Head: Normocephalic and atraumatic. Ears: Hearing grossly intact, no drainage or visible deformity.  Nose: No nasal deviation. Mouth/Throat: No stridor or tracheal deviation.  Non erythematous posterior pharynx noted with clear drainage present.  No white patchy exudate noted. Eyes: Conjunctivae and EOM are normal. No eye drainage or scleral icterus bilaterally.  Neck: Normal range of motion, neck is supple.  Cardiovascular: Normal rate. Regular rhythm; no murmurs, gallops, or rubs.  Pulm/Chest: No respiratory distress. Breath sounds normal bilaterally without wheezes, rhonchi, or rales.  Neurological: Alert and oriented to person, place, and time.  Skin: Skin is warm and dry.  No rashes, lesions, abrasions or bruising noted to skin.   Psychiatric: Normal mood, affect, behavior, and thought content.    Vital signs and nursing note reviewed.   Patient stable and cooperative with examination. PROCEDURES:    LABS/X-RAYS/EKG/MEDS:   No results found for any visits on 01/26/21.  MEDICAL DECISION MAKING:   Patient presents with sore throat, diarrhea, congestion, ear/sinus pressure which started on Monday.  Patient reports that typically if she can "head off" her illness she is able to manage with prednisone without having to use antibiotics.  Patient reports that this typically happens about once a year. Patient does not report any shortness of breath, chest pain, palpitations, visual changes, weakness, tingling, headache, nausea, vomiting, diarrhea, fever, chills.  Chart review completed.  Given symptoms along with assessment findings, likely viral illness.  Did obtain a COVID-19 swab in clinic today.  Spoke with the patient and explained that it is unlikely that she is experiencing an underlying bacterial etiology at this time which would require antibiotics, but I do feel that with her ear and sinus pressure a short course of prednisone may be helpful to head off infection.  Explained about home treatment and care as outlined in her AVS to include rest, increase fluids, Tylenol versus ibuprofen, Mucinex and nasal sprays.  Return as needed.  Patient verbalized understanding and agreed with treatment plan.  Patient stable upon discharge. ASSESSMENT/PLAN:  1. Encounter for screening for COVID-19 - Novel Coronavirus, NAA ([facility]); Standing - Novel Coronavirus, NAA ([facility])  2. Viral illness - predniSONE (DELTASONE) 10 MG tablet; Take 6 tablets (60 mg total) by mouth daily for 1 day, THEN 5 tablets (50 mg total) daily for 1 day, THEN 4 tablets (40 mg total) daily for 1 day, THEN 3 tablets (30 mg total) daily for 1 day, THEN 2 tablets (20  mg total) daily for 1 day, THEN 1 tablet (10 mg total) daily for 1 day.  Dispense: 21 tablet; Refill: 0 Instructions about new medications and side effects  provided.  Plan:   Discharge Instructions      We will call you with any positive results from your COVID-19 testing completed in clinic today.  If you do not receive a phone call from [place] within the next 2-3 days, check your MyChart for up-to-date health information related to testing completed in clinic today.   For most people this is a self-limiting process and can take anywhere from 7 - 10 days to start feeling better. A cough can last up to 3 weeks. Pay special attention to handwashing as this can help prevent the spread of the virus.   Always read the labels of cough and cold medications as they may contain some of the ingredients below.  Rest, push lots of fluids (especially water), and utilize supportive care for symptoms. You may take acetaminophen (Tylenol) every 4-6 hours and ibuprofen every 6-8 hours for muscle pain, joint pain, headaches (you may also alternate these medications). Mucinex (guaifenesin) may be taken over the counter for cough as needed can loosen phlegm. Please read the instructions and take as directed.  Sudafed (pseudophedrine) is sold behind the counter and can help reduce nasal pressure; avoid taking this if you have high blood pressure or feel jittery. Sudafed PE (phenylephrine) can be a helpful, short-term, over-the-counter alternative to limit side effects or if you have high blood pressure.  Flonase nasal spray can help alleviate congestion and sinus pressure. Many patients choose Afrin as a nasal decongestant; do not use for more than 3 days for risk of rebound (increased symptoms after stopping medication).  Saline nasal sprays or rinses can also help nasal congestion (use bottled or sterile water). Warm tea with lemon and honey can sooth sore throat and cough, as can cough drops.   Return to clinic for high fever not improving with medications, chest pain, difficulty breathing, non-stop vomiting, or coughing blood. Follow-up with your primary care  provider if symptoms do not improve as expected in the next 5-7 days.          Amalia Greenhouse, FNP 01/26/21 0843    Amalia Greenhouse, FNP 01/26/21 [phone]

## 2021-01-28 LAB — NOVEL CORONAVIRUS, NAA: SARS-CoV-2, NAA: NOT DETECTED

## 2021-01-28 LAB — SARS-COV-2, NAA 2 DAY TAT

## 2021-01-31 ENCOUNTER — Other Ambulatory Visit: Payer: Self-pay

## 2021-01-31 ENCOUNTER — Ambulatory Visit (INDEPENDENT_AMBULATORY_CARE_PROVIDER_SITE_OTHER): Payer: Managed Care, Other (non HMO) | Admitting: Nurse Practitioner

## 2021-01-31 ENCOUNTER — Encounter: Payer: Self-pay | Admitting: Nurse Practitioner

## 2021-01-31 VITALS — BP 122/78 | Ht 67.0 in | Wt 228.0 lb

## 2021-01-31 DIAGNOSIS — Z01419 Encounter for gynecological examination (general) (routine) without abnormal findings: Secondary | ICD-10-CM

## 2021-01-31 DIAGNOSIS — M85851 Other specified disorders of bone density and structure, right thigh: Secondary | ICD-10-CM

## 2021-01-31 DIAGNOSIS — Z780 Asymptomatic menopausal state: Secondary | ICD-10-CM | POA: Diagnosis not present

## 2021-01-31 NOTE — Progress Notes (Signed)
   Olivia Mendez [birth year] [id number]   History:  55 y.o. G0 presents for annual exam without GYN complaints. Postmenopausal - no HRT, no bleeding. Hypothyroidism managed by endocrinology.   Gynecologic History Patient's last menstrual period was 10/03/2008.   Contraception: post menopausal status  Health maintenance Last Pap: 12/05/2016. Results were: normal, 5-year repeat Last mammogram: 01/21/2021. Results were: possible mass in left breast. Diagnostic mamm scheduled 02/10/2021 Last colonoscopy: 11/2019 per patient. Results were: Normal Last Dexa: 02/24/2020. Results were: T-score -1.1, FRAX 11% / 0.3%  Past medical history, past surgical history, family history and social history were all reviewed and documented in the EPIC chart. Married. 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 Husband owns [employer].   ROS:  A ROS was performed and pertinent positives and negatives are included.  Exam:  Vitals:   01/31/21 1104  BP: 122/78  Weight: 228 lb (103.4 kg)  Height: 5\' 7"  (1.702 m)    Body mass index is 35.71 kg/m.  General appearance:  Normal Thyroid:  Symmetrical, normal in size, without palpable masses or nodularity. Respiratory  Auscultation:  Clear without wheezing or rhonchi Cardiovascular  Auscultation:  Regular rate, without rubs, murmurs or gallops  Edema/varicosities:  Not grossly evident Abdominal  Soft,nontender, without masses, guarding or rebound.  Liver/spleen:  No organomegaly noted  Hernia:  None appreciated  Skin  Inspection:  Grossly normal   Breasts: Examined lying and sitting.   Right: Without masses, retractions, discharge or axillary adenopathy.   Left: Without masses, retractions, discharge or axillary adenopathy. Gentitourinary   Inguinal/mons:  Normal without inguinal adenopathy  External genitalia:  Normal  BUS/Urethra/Skene's glands:  Normal  Vagina:  Normal  Cervix:  Normal  Uterus:  Difficult to palpate due to body habitus  but no gross masses or tenderness  Adnexa/parametria:     Rt: Without masses or tenderness.   Lt: Without masses or tenderness.  Anus and perineum: Normal  Digital rectal exam: Declines  Assessment/Plan:  55 y.o. G0 for annual exam.   Well female exam with routine gynecological exam - Education provided on SBEs, importance of preventative screenings, current guidelines, high calcium diet, regular exercise, and multivitamin daily. Labs with PCP.   Osteopenia of neck of right femur - T-score -1.04 February 2020. Continue Vitamin D supplemnet, high-calcium diet and regular exercise. 3-year repeat recommended.   Postmenopausal - No HRT, no bleeding.   Screening for cervical cancer - Normal Pap history.  Will repeat at 5-year interval per guidelines.  Screening for breast cancer - 01/21/2021 mammogram showed possible left breast mass. Scheduled 02/10/2021 for ultrasound.   Screening for colon cancer - 2021 colonoscopy. Will repeat at GI's recommended interval.   Follow up in 1 year for annual      2022 [facility], 11:19 AM 01/31/2021

## 2021-02-10 ENCOUNTER — Ambulatory Visit: Payer: Managed Care, Other (non HMO)

## 2021-02-10 ENCOUNTER — Other Ambulatory Visit: Payer: Self-pay

## 2021-02-10 ENCOUNTER — Ambulatory Visit
Admission: RE | Admit: 2021-02-10 | Discharge: 2021-02-10 | Disposition: A | Discharge status: Home / Self Care | Payer: Managed Care, Other (non HMO) | Source: Ambulatory Visit | Attending: Internal Medicine | Admitting: Internal Medicine

## 2021-02-10 DIAGNOSIS — R928 Other abnormal and inconclusive findings on diagnostic imaging of breast: Secondary | ICD-10-CM

## 2021-03-31 ENCOUNTER — Other Ambulatory Visit: Payer: Self-pay | Admitting: Cardiology

## 2021-04-17 ENCOUNTER — Ambulatory Visit
Admission: EM | Admit: 2021-04-17 | Discharge: 2021-04-17 | Disposition: A | Discharge status: Home / Self Care | Payer: Managed Care, Other (non HMO) | Attending: Emergency Medicine | Admitting: Emergency Medicine

## 2021-04-17 ENCOUNTER — Encounter: Payer: Self-pay | Admitting: Emergency Medicine

## 2021-04-17 ENCOUNTER — Other Ambulatory Visit: Payer: Self-pay

## 2021-04-17 DIAGNOSIS — H1032 Unspecified acute conjunctivitis, left eye: Secondary | ICD-10-CM

## 2021-04-17 MED ORDER — OFLOXACIN 0.3 % OP SOLN: OPHTHALMIC | 0 refills | Status: DC

## 2021-04-17 NOTE — ED Provider Notes (Signed)
Chief Complaint   Chief Complaint  Patient presents with   Conjunctivitis     Subjective  Olivia Mendez is a 55 y.o. female who presents with left eye redness and drainage that started last night.  No eye injury.  They do not wear contacts.  History obtained from patient.  Patient's past medical, social, surgical, and medication history were reviewed by me and updated in Epic.    Review of Systems   See HPI.    Objective   Vitals:   04/17/21 0951  BP: 118/82  Pulse: 78  Temp: 98.2 F (36.8 C)  SpO2: 98%            General:  Alert, cooperative, appears stated age. No acute distress  Eyes:  Bilateral: PERRLA.  EOMI. Right: WNL Left: Copious purulent drainage noted with erythema to eyelids and injected conjunctivae.  Mildly injected sclera.  Vision: No results found.  Fluorescein:  None needed.      Assessment & Plan  1. Acute bacterial conjunctivitis of left eye - ofloxacin (OCUFLOX) 0.3 % ophthalmic solution; Instill 1 to 2 drops in affected eye(s) every 2 to 4 hours for the first 2 days, then instill 1 to 2 drops 4 times daily for an additional 5 days.  Dispense: 5 mL; Refill: 0  55 y.o. female presents with left eye redness and drainage that started last night.  No eye injury.  They do not wear contacts.  Given symptoms along with assessment findings, likely left bacterial conjunctivitis.  Rx'd Ocuflox to the patient's preferred pharmacy and advised to use warm or cool compresses to help with discomfort.  Advised to return with any increased pain, redness, discharge or decreased vision.  Patient verbalized understanding and agreed with plan.  Patient stable upon discharge.  Return as needed.  Plan:   Discharge Instructions      Pinkeye is redness and swelling of the eye surface and the conjunctiva (the lining of the eyelid and the covering of the white part of the eye). Pinkeye is also called conjunctivitis. Pinkeye is often caused by infection with bacteria or a  virus. Dry air, allergies, smoke, and chemicals are other common causes. Pinkeye often clears on its own in 7 to 10 days. Antibiotics only help if the pinkeye is caused by bacteria. Pinkeye caused by infection spreads easily. If an allergy or chemical is causing pinkeye, it will not go away unless you can avoid whatever is causing it.  Use medication as directed. Warm or cool compresses for 10 - 15 minutes every 6-8 hours. Wash hands frequently and keep fingers away from eyes. Don't wear contacts until you have finished your antibiotics or until you have followed up with your primary care provider or an eye doctor.  Call your primary care provider or return if you notice increased pain, redness, discharge, or decreased vision.          Amalia Greenhouse, FNP 04/17/21 1005

## 2021-04-17 NOTE — ED Triage Notes (Signed)
Pt presents with left eye redness and drainage sxs started last night.

## 2021-04-17 NOTE — Discharge Instructions (Signed)
Pinkeye is redness and swelling of the eye surface and the conjunctiva (the lining of the eyelid and the covering of the white part of the eye). Pinkeye is also called conjunctivitis. Pinkeye is often caused by infection with bacteria or a virus. Dry air, allergies, smoke, and chemicals are other common causes.  Pinkeye often clears on its own in 7 to 10 days. Antibiotics only help if the pinkeye is caused by bacteria. Pinkeye caused by infection spreads easily. If an allergy or chemical is causing pinkeye, it will not go away unless you can avoid whatever is causing it.  Use medication as directed. Warm or cool compresses for 10 - 15 minutes every 6-8 hours. Wash hands frequently and keep fingers away from eyes. Don't wear contacts until you have finished your antibiotics or until you have followed up with your primary care provider or an eye doctor.  Call your primary care provider or return if you notice increased pain, redness, discharge, or decreased vision.  

## 2021-05-31 ENCOUNTER — Other Ambulatory Visit: Payer: Self-pay | Admitting: Endocrinology

## 2021-07-15 ENCOUNTER — Encounter: Payer: Managed Care, Other (non HMO) | Admitting: Internal Medicine

## 2021-07-25 ENCOUNTER — Ambulatory Visit (INDEPENDENT_AMBULATORY_CARE_PROVIDER_SITE_OTHER): Payer: Managed Care, Other (non HMO) | Admitting: Internal Medicine

## 2021-07-25 ENCOUNTER — Encounter: Payer: Self-pay | Admitting: Internal Medicine

## 2021-07-25 VITALS — BP 108/60 | HR 65 | Temp 98.2°F | Ht 67.0 in | Wt 233.0 lb

## 2021-07-25 DIAGNOSIS — Z0001 Encounter for general adult medical examination with abnormal findings: Secondary | ICD-10-CM | POA: Diagnosis not present

## 2021-07-25 DIAGNOSIS — E7849 Other hyperlipidemia: Secondary | ICD-10-CM | POA: Diagnosis not present

## 2021-07-25 DIAGNOSIS — E039 Hypothyroidism, unspecified: Secondary | ICD-10-CM

## 2021-07-25 DIAGNOSIS — E559 Vitamin D deficiency, unspecified: Secondary | ICD-10-CM

## 2021-07-25 DIAGNOSIS — E538 Deficiency of other specified B group vitamins: Secondary | ICD-10-CM | POA: Diagnosis not present

## 2021-07-25 DIAGNOSIS — Z1159 Encounter for screening for other viral diseases: Secondary | ICD-10-CM

## 2021-07-25 LAB — URINALYSIS, ROUTINE W REFLEX MICROSCOPIC
Bilirubin Urine: NEGATIVE
Hgb urine dipstick: NEGATIVE
Ketones, ur: NEGATIVE
Leukocytes,Ua: NEGATIVE
Nitrite: NEGATIVE
Specific Gravity, Urine: 1.02 (ref 1.000–1.030)
Total Protein, Urine: NEGATIVE
Urine Glucose: NEGATIVE
Urobilinogen, UA: 0.2 (ref 0.0–1.0)
pH: 6 (ref 5.0–8.0)

## 2021-07-25 LAB — BASIC METABOLIC PANEL
BUN: 13 mg/dL (ref 6–23)
CO2: 33 mEq/L — ABNORMAL HIGH (ref 19–32)
Calcium: 9.9 mg/dL (ref 8.4–10.5)
Chloride: 105 mEq/L (ref 96–112)
Creatinine, Ser: 0.71 mg/dL (ref 0.40–1.20)
GFR: 94.64 mL/min (ref >60.00)
Glucose, Bld: 95 mg/dL (ref 70–99)
Potassium: 4.5 mEq/L (ref 3.5–5.1)
Sodium: 141 mEq/L (ref 135–145)

## 2021-07-25 LAB — CBC WITH DIFFERENTIAL/PLATELET
Basophils Absolute: 0 10*3/uL (ref 0.0–0.1)
Basophils Relative: 0.6 % (ref 0.0–3.0)
Eosinophils Absolute: 0.2 10*3/uL (ref 0.0–0.7)
Eosinophils Relative: 4.3 % (ref 0.0–5.0)
HCT: 39.9 % (ref 36.0–46.0)
Hemoglobin: 13.5 g/dL (ref 12.0–15.0)
Lymphocytes Relative: 39 % (ref 12.0–46.0)
Lymphs Abs: 1.9 10*3/uL (ref 0.7–4.0)
MCHC: 33.9 g/dL (ref 30.0–36.0)
MCV: 94.2 fl (ref 78.0–100.0)
Monocytes Absolute: 0.3 10*3/uL (ref 0.1–1.0)
Monocytes Relative: 6.7 % (ref 3.0–12.0)
Neutro Abs: 2.4 10*3/uL (ref 1.4–7.7)
Neutrophils Relative %: 49.4 % (ref 43.0–77.0)
Platelets: 289 10*3/uL (ref 150.0–400.0)
RBC: 4.24 Mil/uL (ref 3.87–5.11)
RDW: 13.1 % (ref 11.5–15.5)
WBC: 5 10*3/uL (ref 4.0–10.5)

## 2021-07-25 LAB — LIPID PANEL
Cholesterol: 176 mg/dL (ref 0–200)
HDL: 63.5 mg/dL (ref >39.00)
LDL Cholesterol: 98 mg/dL (ref 0–99)
NonHDL: 112.67
Total CHOL/HDL Ratio: 3
Triglycerides: 75 mg/dL (ref 0.0–149.0)
VLDL: 15 mg/dL (ref 0.0–40.0)

## 2021-07-25 LAB — HEPATIC FUNCTION PANEL
ALT: 24 U/L (ref 0–35)
AST: 18 U/L (ref 0–37)
Albumin: 4.4 g/dL (ref 3.5–5.2)
Alkaline Phosphatase: 69 U/L (ref 39–117)
Bilirubin, Direct: 0.1 mg/dL (ref 0.0–0.3)
Total Bilirubin: 0.9 mg/dL (ref 0.2–1.2)
Total Protein: 7.2 g/dL (ref 6.0–8.3)

## 2021-07-25 LAB — VITAMIN D 25 HYDROXY (VIT D DEFICIENCY, FRACTURES): VITD: 54.38 ng/mL (ref 30.00–100.00)

## 2021-07-25 LAB — TSH: TSH: 1.32 u[IU]/mL (ref 0.35–5.50)

## 2021-07-25 LAB — T4, FREE: Free T4: 1.34 ng/dL (ref 0.60–1.60)

## 2021-07-25 LAB — VITAMIN B12: Vitamin B-12: 293 pg/mL (ref 211–911)

## 2021-07-25 MED ORDER — LOSARTAN POTASSIUM 100 MG PO TABS: 100.0000 mg | ORAL_TABLET | Freq: Every day | ORAL | 3 refills | Status: DC

## 2021-07-25 MED ORDER — PANTOPRAZOLE SODIUM 40 MG PO TBEC: 40.0000 mg | DELAYED_RELEASE_TABLET | Freq: Every day | ORAL | 3 refills | Status: DC

## 2021-07-25 NOTE — Progress Notes (Signed)
Patient ID: Olivia Mendez, female   DOB: [birth year], 56 y.o.   MRN: [id number]         Chief Complaint:: wellness exam and low vit d and b12, hld, hypothyroid       HPI:  Olivia Mendez is a 56 y.o. female here for wellness exam; already up to date except due for hep c screen                        Also taking low dose vit d, not taking B12.  Pt denies chest pain, increased sob or doe, wheezing, orthopnea, PND, increased LE swelling, palpitations, dizziness or syncope.   Pt denies polydipsia, polyuria, or new focal neuro s/s.   Pt denies fever, wt loss, night sweats, loss of appetite, or other constitutional symptoms  No other new complaints  Denies hyper or hypo thyroid symptoms such as voice, skin or hair change.     Wt Readings from Last 3 Encounters:  07/25/21 233 lb (105.7 kg)  01/31/21 228 lb (103.4 kg)  12/03/20 229 lb 6.4 oz (104.1 kg)   BP Readings from Last 3 Encounters:  07/25/21 108/60  04/17/21 118/82  01/31/21 122/78   Immunization History  Administered Date(s) Administered   Influenza,inj,Quad PF,6+ Mos 02/20/2020   Influenza-Unspecified 03/20/2019, 04/11/2021   PFIZER(Purple Top)SARS-COV-2 Vaccination 08/18/2019, 09/08/2019, 04/11/2020, 10/16/2020, 02/26/2021   Td 09/14/2008   Tdap 12/05/2016   Zoster Recombinat (Shingrix) 07/13/2020, 09/20/2020   There are no preventive care reminders to display for this patient.     Past Medical History:  Diagnosis Date   Allergic rhinitis 12/26/2014   Aortic aneurysm (HCC) 2018   Bicuspid aortic valve 12/14/2020   Endometriosis 1990's   Hypothyroid    Migraines    Past Surgical History:  Procedure Laterality Date   APPENDECTOMY     CHOLECYSTECTOMY     OTHER SURGICAL HISTORY     surgery for Endometriosis   TEE WITHOUT CARDIOVERSION N/A 03/22/2017   Procedure: TRANSESOPHAGEAL ECHOCARDIOGRAM (TEE);  Surgeon: Yates Decamp, MD;  Location: [facility] ENDOSCOPY;  Service: Cardiovascular;  Laterality: N/A;    reports that she has never  smoked. She has never used smokeless tobacco. She reports current alcohol use. She reports that she does not use drugs. family history includes Cancer in her paternal grandmother; Diabetes in her mother; Healthy in her father; Heart disease in her maternal grandfather and paternal grandfather; Multiple sclerosis in her maternal grandmother; Thyroid disease in her cousin and maternal aunt. Allergies  Allergen Reactions   Vicodin [Hydrocodone-Acetaminophen] Nausea And Vomiting   Current Outpatient Medications on File Prior to Visit  Medication Sig Dispense Refill   Ascorbic Acid (VITAMIN C) 1000 MG tablet SMARTSIG:1 By Mouth     B Complex Vitamins (B COMPLEX 100 PO) Take by mouth.     Cholecalciferol (VITAMIN D-3 PO) Take 2,000 Units by mouth.     levothyroxine (SYNTHROID) 125 MCG tablet TAKE 1 TABLET DAILY 90 tablet 3   liothyronine (CYTOMEL) 5 MCG tablet TAKE 1 TABLET DAILY 90 tablet 3   Omega-3 Fatty Acids (FISH OIL) 1000 MG CAPS Take by mouth.     No current facility-administered medications on file prior to visit.        ROS:  All others reviewed and negative.  Objective        PE:  BP 108/60 (BP Location: Right Arm, Patient Position: Sitting, Cuff Size: Large)    Pulse 65    Temp  98.2 F (36.8 C) (Oral)    Ht 5\' 7"  (1.702 m)    Wt 233 lb (105.7 kg)    LMP 10/03/2008    SpO2 98%    BMI 36.49 kg/m                 Constitutional: Pt appears in NAD               HENT: Head: NCAT.                Right Ear: External ear normal.                 Left Ear: External ear normal.                Eyes: . Pupils are equal, round, and reactive to light. Conjunctivae and EOM are normal               Nose: without d/c or deformity               Neck: Neck supple. Gross normal ROM               Cardiovascular: Normal rate and regular rhythm.                 Pulmonary/Chest: Effort normal and breath sounds without rales or wheezing.                Abd:  Soft, NT, ND, + BS, no organomegaly                Neurological: Pt is alert. At baseline orientation, motor grossly intact               Skin: Skin is warm. No rashes, no other new lesions, LE edema - none               Psychiatric: Pt behavior is normal without agitation   Micro: none  Cardiac tracings I have personally interpreted today:  none  Pertinent Radiological findings (summarize): none   Lab Results  Component Value Date   WBC 5.0 07/25/2021   HGB 13.5 07/25/2021   HCT 39.9 07/25/2021   PLT 289.0 07/25/2021   GLUCOSE 95 07/25/2021   CHOL 176 07/25/2021   TRIG 75.0 07/25/2021   HDL 63.50 07/25/2021   LDLCALC 98 07/25/2021   ALT 24 07/25/2021   AST 18 07/25/2021   NA 141 07/25/2021   K 4.5 07/25/2021   CL 105 07/25/2021   CREATININE 0.71 07/25/2021   BUN 13 07/25/2021   CO2 33 (H) 07/25/2021   TSH 1.32 07/25/2021   INR 0.9 ratio 09/14/2008   HGBA1C 5.5 11/13/2014   Assessment/Plan:  Olivia Mendez is a 56 y.o. White or Caucasian [1] female with  has a past medical history of Allergic rhinitis (12/26/2014), Aortic aneurysm (HCC) (2018), Bicuspid aortic valve (12/14/2020), Endometriosis (1990's), Hypothyroid, and Migraines.  Vitamin D deficiency Last vitamin D Lab Results  Component Value Date   VD25OH 36.92 11/09/2020   Low, to start oral replacement   B12 deficiency Lab Results  Component Value Date   VITAMINB12 314 11/09/2020   Low normal, ok for oral replacement - b12 1000 mcg qd x 6 mo  Encounter for well adult exam with abnormal findings Age and sex appropriate education and counseling updated with regular exercise and diet Referrals for preventative services - for hep c screen Immunizations addressed - none needed Smoking counseling  - none needed Evidence for depression  or other mood disorder - none significant Most recent labs reviewed. I have personally reviewed and have noted: 1) the patient's medical and social history 2) The patient's current medications and supplements 3) The  patient's height, weight, and BMI have been recorded in the chart   HLD (hyperlipidemia) Lab Results  Component Value Date   LDLCALC 98 07/25/2021   Stable, pt to continue current statin  - diet, declines statin  Hypothyroidism Lab Results  Component Value Date   TSH 1.32 07/25/2021   Stable, pt to continue levothyroxine  Followup: Return in about 1 year (around 07/25/2022).  Oliver Barre, MD 07/31/2021 9:42 PM [facility]

## 2021-07-25 NOTE — Assessment & Plan Note (Signed)
Lab Results  Component Value Date   VITAMINB12 314 11/09/2020   Low normal, ok for oral replacement - b12 1000 mcg qd x 6 mo

## 2021-07-25 NOTE — Patient Instructions (Signed)
Please take all new medication as recommended - the OTC B12 at 1000 mcg per day for 6 months  Please take OTC Vitamin D3 at 2000 units per day, indefinitely  Please continue all other medications as before, and refills have been done if requested.  Please have the pharmacy call with any other refills you may need.  Please continue your efforts at being more active, low cholesterol diet, and weight control.  You are otherwise up to date with prevention measures today.  Please keep your appointments with your specialists as you may have planned  Please go to the LAB at the blood drawing area for the tests to be done  You will be contacted by phone if any changes need to be made immediately.  Otherwise, you will receive a letter about your results with an explanation, but please check with MyChart first.  Please remember to sign up for MyChart if you have not done so, as this will be important to you in the future with finding out test results, communicating by private email, and scheduling acute appointments online when needed.  Please make an Appointment to return for your 1 year visit, or sooner if needed

## 2021-07-25 NOTE — Assessment & Plan Note (Signed)
Last vitamin D Lab Results  Component Value Date   VD25OH 36.92 11/09/2020   Low, to start oral replacement

## 2021-07-26 LAB — HEPATITIS C ANTIBODY
Hepatitis C Ab: NONREACTIVE
SIGNAL TO CUT-OFF: <0.02 (ref <1.00)

## 2021-07-31 ENCOUNTER — Encounter: Payer: Self-pay | Admitting: Internal Medicine

## 2021-07-31 NOTE — Assessment & Plan Note (Signed)
Lab Results  Component Value Date   LDLCALC 98 07/25/2021   Stable, pt to continue current statin  - diet, declines statin

## 2021-07-31 NOTE — Assessment & Plan Note (Signed)
Lab Results  Component Value Date   TSH 1.32 07/25/2021   Stable, pt to continue levothyroxine

## 2021-07-31 NOTE — Assessment & Plan Note (Addendum)
Age and sex appropriate education and counseling updated with regular exercise and diet Referrals for preventative services - for hep c screen Immunizations addressed - none needed Smoking counseling  - none needed Evidence for depression or other mood disorder - none significant Most recent labs reviewed. I have personally reviewed and have noted: 1) the patient's medical and social history 2) The patient's current medications and supplements 3) The patient's height, weight, and BMI have been recorded in the chart  
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
## 2021-11-23 ENCOUNTER — Ambulatory Visit
Admission: RE | Admit: 2021-11-23 | Discharge: 2021-11-23 | Disposition: A | Discharge status: Home / Self Care | Payer: Managed Care, Other (non HMO) | Source: Ambulatory Visit | Attending: Cardiology | Admitting: Cardiology

## 2021-11-23 DIAGNOSIS — I712 Thoracic aortic aneurysm, without rupture, unspecified: Secondary | ICD-10-CM

## 2021-11-23 MED ORDER — IOPAMIDOL (ISOVUE-370) INJECTION 76%
75.0000 mL | Freq: Once | INTRAVENOUS | Status: AC | PRN
  Administered 2021-11-23: 75 mL via INTRAVENOUS

## 2021-11-23 NOTE — Progress Notes (Signed)
CTA chest 11/23/2021: Grossly stable 4.2 cm ascending thoracic aortic aneurysm. Recommend annual imaging followup by CTA or MRA No significant extracardiac abnormality.  Normal cardiac size and pericardial structure.

## 2021-11-28 ENCOUNTER — Ambulatory Visit: Payer: Managed Care, Other (non HMO)

## 2021-11-28 ENCOUNTER — Other Ambulatory Visit (INDEPENDENT_AMBULATORY_CARE_PROVIDER_SITE_OTHER): Payer: Managed Care, Other (non HMO)

## 2021-11-28 DIAGNOSIS — E063 Autoimmune thyroiditis: Secondary | ICD-10-CM

## 2021-11-28 DIAGNOSIS — I712 Thoracic aortic aneurysm, without rupture, unspecified: Secondary | ICD-10-CM

## 2021-11-28 DIAGNOSIS — Q231 Congenital insufficiency of aortic valve: Secondary | ICD-10-CM

## 2021-11-28 DIAGNOSIS — E559 Vitamin D deficiency, unspecified: Secondary | ICD-10-CM | POA: Diagnosis not present

## 2021-11-28 LAB — TSH: TSH: 2.32 u[IU]/mL (ref 0.35–5.50)

## 2021-11-28 LAB — T4, FREE: Free T4: 1.2 ng/dL (ref 0.60–1.60)

## 2021-11-28 LAB — VITAMIN D 25 HYDROXY (VIT D DEFICIENCY, FRACTURES): VITD: 57.61 ng/mL (ref 30.00–100.00)

## 2021-11-30 ENCOUNTER — Encounter: Payer: Self-pay | Admitting: Endocrinology

## 2021-11-30 ENCOUNTER — Ambulatory Visit: Payer: Managed Care, Other (non HMO) | Admitting: Endocrinology

## 2021-11-30 VITALS — BP 122/80 | HR 63 | Ht 68.0 in | Wt 232.4 lb

## 2021-11-30 DIAGNOSIS — E063 Autoimmune thyroiditis: Secondary | ICD-10-CM | POA: Diagnosis not present

## 2021-11-30 DIAGNOSIS — E559 Vitamin D deficiency, unspecified: Secondary | ICD-10-CM

## 2021-11-30 DIAGNOSIS — M85859 Other specified disorders of bone density and structure, unspecified thigh: Secondary | ICD-10-CM

## 2021-11-30 NOTE — Progress Notes (Signed)
Patient ID: Olivia Mendez, female   DOB: 24-Sep-1965, 56 y.o.   MRN: [id number]           Referring Physician: Oliver Barre  Reason for Appointment:  follow-up visit    History of Present Illness:   Hypothyroidism was first diagnosed in 1994  Baseline consultation history: At the time of diagnosis patient was having symptoms of fatigue and hair loss.  Also had noticed a swelling in her neck and was found to have a goiter She does not think she had symptoms of, cold sensitivity, difficulty concentrating, dry skin, weight gain  She had been treated by various physicians over the last several years Around 2014 she thinks she was continuing to have fatigue  and when seen by a new endocrinologist she was suggested trying levothyroxine and liothyronine separately However she is not clear whether she started feeling better with this, she may have felt a little less tired        The patient has been treated with either levothyroxine or a combination of levothyroxine and Cytomel  RECENT history: On her initial consultation in 8/19 she was complaining of fatigue She has had chronic cold intolerance  The dose of levothyroxine was progressively reduced after the visit in 8/21 and now is getting 125 mcg from [facility] Also she did not have coverage for brand-name Synthroid use previously Also on generic LIOTHYRONINE 5 mcg daily  No complaints of feeling unusually tired recently Weight is about the same No change in cold intolerance  She has been regular with taking her levothyroxine and liothyronine before eating does not drink her coffee till 30 minutes later  Does not take any calcium or iron containing vitamins at the same time; takes her Protonix at bedtime  Her TSH is again consistently normal along with T4 once         Patient's weight history is as follows:  Wt Readings from Last 3 Encounters:  11/30/21 232 lb 6.4 oz (105.4 kg)  07/25/21 233 lb (105.7 kg)  01/31/21 228 lb (103.4 kg)     Thyroid function results have been as follows:   Lab Results  Component Value Date   TSH 2.32 11/28/2021   TSH 1.32 07/25/2021   TSH 1.07 11/09/2020   TSH 2.47 06/08/2020   FREET4 1.20 11/28/2021   FREET4 1.34 07/25/2021   FREET4 1.13 11/09/2020   FREET4 1.06 06/08/2020   T3FREE 3.4 06/08/2020   T3FREE 3.3 07/07/2019   T3FREE 3.1 05/13/2019   Related to problem discussed today: See review of systems  Past Medical History:  Diagnosis Date   Allergic rhinitis 12/26/2014   Aortic aneurysm (HCC) 2018   Bicuspid aortic valve 12/14/2020   Endometriosis 1990's   Hypothyroid    Migraines     Past Surgical History:  Procedure Laterality Date   APPENDECTOMY     CHOLECYSTECTOMY     OTHER SURGICAL HISTORY     surgery for Endometriosis   TEE WITHOUT CARDIOVERSION N/A 03/22/2017   Procedure: TRANSESOPHAGEAL ECHOCARDIOGRAM (TEE);  Surgeon: Yates Decamp, MD;  Location: [facility] ENDOSCOPY;  Service: Cardiovascular;  Laterality: N/A;    Family History  Problem Relation Age of Onset   Diabetes Mother    Multiple sclerosis Maternal Grandmother    Heart disease Maternal Grandfather    Cancer Paternal Grandmother        melanoma   Heart disease Paternal Grandfather    Healthy Father    Thyroid disease Cousin    Thyroid disease Maternal  Aunt     Social History:  reports that she has never smoked. She has never used smokeless tobacco. She reports current alcohol use. She reports that she does not use drugs.  Allergies:  Allergies  Allergen Reactions   Vicodin [Hydrocodone-Acetaminophen] Nausea And Vomiting    Allergies as of 11/30/2021       Reactions   Vicodin [hydrocodone-acetaminophen] Nausea And Vomiting        Medication List        Accurate as of November 30, 2021  8:44 AM. If you have any questions, ask your nurse or doctor.          B COMPLEX 100 PO Take by mouth.   Fish Oil 1000 MG Caps Take by mouth.   levothyroxine 125 MCG tablet Commonly known as:  SYNTHROID TAKE 1 TABLET DAILY   liothyronine 5 MCG tablet Commonly known as: CYTOMEL TAKE 1 TABLET DAILY   losartan 100 MG tablet Commonly known as: COZAAR Take 1 tablet (100 mg total) by mouth daily.   pantoprazole 40 MG tablet Commonly known as: PROTONIX Take 1 tablet (40 mg total) by mouth daily.   vitamin C 1000 MG tablet SMARTSIG:1 By Mouth   VITAMIN D-3 PO Take 2,000 Units by mouth.        Review of Systems   She has had chronic reflux and takes her Protonix at bedtime      Vitamin B12 deficiency: She previously had stopped B12 supplement since she thought she started having itching after starting this and vitamin D She is taking only B complex vitamins Her B12 level last was back to normal levels  Lab Results  Component Value Date   VITAMINB12 293 07/25/2021     VITAMIN D deficiency: She had been given 5000 units vitamin D3 which she now takes in the evenings with food  Vitamin D level is consistently normal   Lab Results  Component Value Date   VD25OH 57.61 11/28/2021   VD25OH 54.38 07/25/2021   VD25OH 36.92 11/09/2020   VD25OH 23.29 (L) 06/08/2020   Mild OSTEOPENIA: T score was -1.1 when checked in 2021 this is despite likely having premature menopause in her 40s  Followed by PCP for her hypertension, taking losartan 100 mg        Examination:    BP 122/80   Pulse 63   Ht 5\' 8"  (1.727 m)   Wt 232 lb 6.4 oz (105.4 kg)   LMP 10/03/2008   SpO2 98%   BMI 35.34 kg/m      Assessment:  HYPOTHYROIDISM, primary, 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  She has been on a combination of levothyroxine 125 mcg and 5 mcg of liothyronine  She is taking her supplements consistently every morning and is subjectively doing well Clinically doing well and her TSH is consistently normal  Vitamin D deficiency: She is getting consistently normal levels with 5000 units vitamin D3 taken in the evening with food   PLAN:   She will continue with  levothyroxine and liothyronine unchanged as above, reminded her to take them regularly every morning  Counseling regarding her osteopenia: Since she likely had premature menopause and had only minimal osteopenia can wait till she is 44 to follow-up bone density  Also continue regimen of vitamin D   There are no Patient Instructions on file for this visit.   76 11/30/2021, 8:44 AM     Note: This office note was prepared with Dragon voice recognition system technology. Any  transcriptional errors that result from this process are unintentional.    Reather Littler

## 2021-12-05 ENCOUNTER — Ambulatory Visit: Payer: Managed Care, Other (non HMO) | Admitting: Cardiology

## 2021-12-15 ENCOUNTER — Encounter: Payer: Self-pay | Admitting: Cardiology

## 2021-12-15 ENCOUNTER — Ambulatory Visit: Payer: Managed Care, Other (non HMO) | Admitting: Cardiology

## 2021-12-15 VITALS — BP 121/80 | HR 71 | Temp 98.1°F | Resp 14 | Ht 68.0 in | Wt 234.6 lb

## 2021-12-15 DIAGNOSIS — I7121 Aneurysm of the ascending aorta, without rupture: Secondary | ICD-10-CM

## 2021-12-15 DIAGNOSIS — E6609 Other obesity due to excess calories: Secondary | ICD-10-CM

## 2021-12-15 DIAGNOSIS — Q263 Partial anomalous pulmonary venous connection: Secondary | ICD-10-CM

## 2021-12-15 DIAGNOSIS — I491 Atrial premature depolarization: Secondary | ICD-10-CM

## 2021-12-15 DIAGNOSIS — Q231 Congenital insufficiency of aortic valve: Secondary | ICD-10-CM

## 2021-12-15 NOTE — Progress Notes (Signed)
Primary Physician/Referring:  Corwin Levins, MD  Patient ID: Olivia Mendez, female    DOB: 02-Mar-1966, 56 y.o.   MRN: [id number]  Chief Complaint  Patient presents with   Bicuspid aortic valve   Aortic aneurysm   Follow-up    1 year   HPI:    Olivia Mendez  is a 56 y.o. 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  Denies chest pain or shortness of breath.  Tolerating losartan well.  She remains asymptomatic.  Past Medical History:  Diagnosis Date   Allergic rhinitis 12/26/2014   Aortic aneurysm (HCC) 2018   Bicuspid aortic valve 12/14/2020   Endometriosis 1990's   Hypothyroid    Migraines    Past Surgical History:  Procedure Laterality Date   APPENDECTOMY     CHOLECYSTECTOMY     OTHER SURGICAL HISTORY     surgery for Endometriosis   TEE WITHOUT CARDIOVERSION N/A 03/22/2017   Procedure: TRANSESOPHAGEAL ECHOCARDIOGRAM (TEE);  Surgeon: Yates Decamp, MD;  Location: [facility] ENDOSCOPY;  Service: Cardiovascular;  Laterality: N/A;   Family History  Problem Relation Age of Onset   Diabetes Mother    Healthy Father    Thyroid disease Maternal Aunt    Multiple sclerosis Maternal Grandmother    Heart disease Maternal Grandfather    Cancer Paternal Grandmother        melanoma   Heart disease Paternal Grandfather    Thyroid disease Cousin     Social History   Tobacco Use   Smoking status: Never   Smokeless tobacco: Never  Substance Use Topics   Alcohol use: Yes    Comment: Rare   Marital Status: Married  ROS  Review of Systems  Cardiovascular:  Negative for dyspnea on exertion, leg swelling and syncope.  Gastrointestinal:  Negative for melena.    Objective  Blood pressure 121/80, pulse 71, temperature 98.1 F (36.7 C), temperature source Temporal, resp. rate 14, height 5\' 8"  (1.727 m), weight 234 lb 9.6  oz (106.4 kg), last menstrual period 10/03/2008, SpO2 97 %.      12/15/2021    2:44 PM 11/30/2021    8:39 AM 07/25/2021    8:22 AM  Vitals with BMI  Height 5\' 8"  5\' 8"  5\' 7"   Weight 234 lbs 10 oz 232 lbs 6 oz 233 lbs  BMI 35.68 35.34 36.48  Systolic 121 122 07/27/2021  Diastolic 80 80 60  Pulse 71 63 65     Physical Exam Constitutional:      General: She is not in acute distress.    Appearance: She is well-developed.     Comments: Mildly obese  HENT:     Head: Atraumatic.  Cardiovascular:     Rate and Rhythm: Normal rate and regular rhythm.     Pulses: Normal pulses and intact distal pulses.     Heart sounds: S1 normal and S2 normal. Murmur heard.     Early systolic murmur is present with a grade of 2/6 at the upper right sternal border.     No gallop.     Comments: No leg edema, no JVD.  Pulmonary:     Effort: Pulmonary effort is normal. No accessory muscle usage.     Breath sounds: Normal breath sounds.  Abdominal:     General: Bowel sounds are normal.     Palpations: Abdomen is  soft.    Laboratory examination:   Recent Labs    07/25/21 0916  NA 141  K 4.5  CL 105  CO2 33*  GLUCOSE 95  BUN 13  CREATININE 0.71  CALCIUM 9.9   CrCl cannot be calculated (Patient's most recent lab result is older than the maximum 21 days allowed.).     Latest Ref Rng & Units 07/25/2021    9:16 AM 07/13/2020    9:55 AM 04/20/2020    8:31 AM  CMP  Glucose 70 - 99 mg/dL 95  93  92   BUN 6 - 23 mg/dL 13  11  8    Creatinine 0.40 - 1.20 mg/dL  5.57  3.22   Sodium 135 - 145 mEq/L 141  140  140   Potassium 3.5 - 5.1 mEq/L 4.5  4.1  4.2   Chloride 96 - 112 mEq/L 105  106  105   CO2 19 - 32 mEq/L 33  30  27   Calcium 8.4 - 10.5 mg/dL 9.9  0.25  9.6   Total Protein 6.0 - 8.3 g/dL 7.2  6.8  6.8   Total Bilirubin 0.2 - 1.2 mg/dL 0.9  1.0  1.2   Alkaline Phos 39 - 117 U/L 69  58  67   AST 0 - 37 U/L 18  24  17    ALT 0 - 35 U/L 24  33  22       Latest Ref Rng & Units 07/25/2021    9:16  AM 07/13/2020    9:55 AM 04/20/2020    8:31 AM  CBC  WBC 4.0 - 10.5 K/uL 5.0  6.1  9.0   Hemoglobin 12.0 - 15.0 g/dL 09/10/2020  04/22/2020  06.2   Hematocrit 36.0 - 46.0 % 39.9  38.6  37.6   Platelets 150.0 - 400.0 K/uL 289.0  298.0  296.0    Lipid Panel Recent Labs    07/25/21 0916  CHOL 176  TRIG 75.0  LDLCALC 98  VLDL 15.0  HDL 63.50  CHOLHDL 3       Component Value Date/Time   CHOL 176 07/25/2021 0916   TRIG 75.0 07/25/2021 0916   HDL 63.50 07/25/2021 0916   CHOLHDL 3 07/25/2021 0916   VLDL 15.0 07/25/2021 0916   LDLCALC 98 07/25/2021 0916   HEMOGLOBIN A1C Lab Results  Component Value Date   HGBA1C 5.5 11/13/2014   TSH Recent Labs    07/25/21 0916 11/28/21 0833  TSH 1.32 2.32   Medications and allergies   Allergies  Allergen Reactions   Vicodin [Hydrocodone-Acetaminophen] Nausea And Vomiting    Current Outpatient Medications  Medication Instructions   Ascorbic Acid (VITAMIN C) 1000 MG tablet SMARTSIG:1 By Mouth   Cholecalciferol (VITAMIN D-3 PO) 2,000 Units, Oral   levothyroxine (SYNTHROID) 125 MCG tablet TAKE 1 TABLET DAILY   liothyronine (CYTOMEL) 5 MCG tablet TAKE 1 TABLET DAILY   losartan (COZAAR) 100 mg, Oral, Daily   Omega-3 Fatty Acids (FISH OIL) 1000 MG CAPS Oral   pantoprazole (PROTONIX) 40 mg, Oral, Daily   vitamin B-12 (CYANOCOBALAMIN) 100 mcg, Oral, Daily   Radiology:   See below  Cardiac Studies:   Treadmill stress test Apr 11, 2017: Indication: Chest pain The patient exercised on Bruce protocol. Exercise time was 06:59 min . Patient achieved maximum HR of 158 BPM. The target heart rate was 111%, 85% of 94% . Resting EKG demonstrated NSR. Stress EKG: ST Changes: Depression upsloping, noted at pea/k exercise but back  to baseline immediately into recovery at < 1 minute. Arrhythmias: ventricular premature beats-isolated. Chest Pain: non-limiting. BP Response to Exercise: Normal resting BP- appropriate response. HR Response to Exercise: Appropriat.e.  Exercise capacity was normal. Overall Impression: Normal stress test. Exercise capacity was normal. Suspect GERD to be etiology for cough and chest pain with exercise. Clinical correlation recommended. Low risk stress EKG.  CTA chest 11/23/2021: Grossly stable 4.2 cm ascending thoracic aortic aneurysm. Recommend annual imaging followup by CTA or MRA No significant extracardiac abnormality.  Normal cardiac size and pericardial structure.  PCV ECHOCARDIOGRAM COMPLETE 11/28/2021  Left ventricle cavity is normal in size and wall thickness. Normal global wall motion. Normal LV systolic function with EF 56%. Normal diastolic filling pattern. Right ventricle cavity is upper limit normal in size. Normal right ventricular function. Aortic valve not well visualized, previously reported bicuspid.  Mild aortic stenosis. Vmax 2.1 m/sec, mean PG 10 mmHg, AVA 1.8 cm by continuity equation. Mild tricuspid regurgitation. Estimated pulmonary artery systolic pressure 26 mmHg. Ascending aortic aneurysm measuring 4.5 cm. Previous study on 12/24/2020 reported ascending aorta at 4.2 cm. Recent CTA reported ascending 4.2 cm ascending aorta aneurysm. Overall, this may represent marginal variation, but overall stable ascending aorta aneurysm.  EKG  EKG 12/15/2021: Probably ectopic atrial rhythm at rate of 61 bpm, normal axis, incomplete right bundle branch block.  No evidence of ischemia.  Compared to 12/03/2020, sinus rhythm has been replaced.  Assessment     ICD-10-CM   1. Bicuspid aortic valve  Q23.1 EKG 12-Lead    2. Aneurysm of ascending aorta without rupture (HCC)  I71.21     3. 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  Q26.3     5. Class 2 obesity due to excess calories without serious comorbidity with body mass index (BMI) of 35.0 to 35.9 in adult  E66.09    Z68.35        Recommendations:   CASARA PERRIER  is a 56 y.o. 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  She is tolerating losartan 100 mg daily without any side effects.  Her lipids are normal, blood pressure is normal and no change in physical exam with regard to murmur.  Her ascending aortic aneurysm is small, I would like to repeat echocardiogram in a year and alternate her CT scans to reduce radiation exposure.  I reviewed the echocardiogram and the CT scan results with the patient today. Although she has anomalous pulmonary venous connection, she is completely asymptomatic and no therapy is indicated with regard to this.  Weight loss was discussed with the patient.  She is today in ectopic atrial rhythm, I shared the EKG with her from previous visit on today's visit.  Suspect this could be related to high vagal tone but could also indicate obesity related pickwickian-like syndrome and or development of sleep apnea and atrial arrhythmias although right atrium was within normal size.  It is to be noted that the right ventricle is in the upper limit of normal in size.  Hence I would have a very low threshold to start her on Wegovy.  I discussed this with the patient, she would like to wait on this and I would like to see him back in 3 months with repeat EKG.  No  orders were placed, will order an echocardiogram on her next office visit.  I may consider even repeating the echocardiogram in 6 months instead to follow-up on right ventricular enlargement that is borderline.   Yates Decamp, MD, [facility] 12/15/2021, 3:24 PM Office: [phone] Fax: [phone] Pager: [phone]

## 2022-01-16 ENCOUNTER — Other Ambulatory Visit: Payer: Self-pay | Admitting: Endocrinology

## 2022-02-01 ENCOUNTER — Ambulatory Visit (INDEPENDENT_AMBULATORY_CARE_PROVIDER_SITE_OTHER): Payer: Managed Care, Other (non HMO) | Admitting: Nurse Practitioner

## 2022-02-01 ENCOUNTER — Other Ambulatory Visit (HOSPITAL_COMMUNITY)
Admission: RE | Admit: 2022-02-01 | Discharge: 2022-02-01 | Disposition: A | Discharge status: Home / Self Care | Payer: Managed Care, Other (non HMO) | Source: Ambulatory Visit | Attending: Nurse Practitioner | Admitting: Nurse Practitioner

## 2022-02-01 ENCOUNTER — Encounter: Payer: Self-pay | Admitting: Nurse Practitioner

## 2022-02-01 VITALS — BP 112/82 | HR 68 | Ht 67.0 in | Wt 233.0 lb

## 2022-02-01 DIAGNOSIS — M85851 Other specified disorders of bone density and structure, right thigh: Secondary | ICD-10-CM

## 2022-02-01 DIAGNOSIS — Z01419 Encounter for gynecological examination (general) (routine) without abnormal findings: Secondary | ICD-10-CM | POA: Insufficient documentation

## 2022-02-01 DIAGNOSIS — Z780 Asymptomatic menopausal state: Secondary | ICD-10-CM

## 2022-02-01 NOTE — Progress Notes (Signed)
   Olivia Mendez 1966-05-08 [id number]   History:  56 y.o. G0 presents for annual exam without GYN complaints. Postmenopausal - no HRT, no bleeding. Hypothyroidism managed by endocrinology.   Gynecologic History Patient's last menstrual period was 10/03/2008.   Contraception: post menopausal status Sexually active: Yes  Health maintenance Last Pap: 12/05/2016. Results were: Normal, 5-year repeat Last mammogram: 01/21/2021. Results were: possible mass in left breast. Diagnostic imaging normal Last colonoscopy: 02/24/2020. Results were: Normal, 10-year recall Last Dexa: 02/24/2020. Results were: T-score -1.1, FRAX 11% / 0.3%  Past medical history, past surgical history, family history and social history were all reviewed and documented in the EPIC chart. Married. 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 Husband owns [employer].   ROS:  A ROS was performed and pertinent positives and negatives are included.  Exam:  Vitals:   02/01/22 0818  BP: 112/82  Pulse: 68  SpO2: 97%  Weight: 233 lb (105.7 kg)  Height: 5\' 7"  (1.702 m)     Body mass index is 36.49 kg/m.  General appearance:  Normal Thyroid:  Symmetrical, normal in size, without palpable masses or nodularity. Respiratory  Auscultation:  Clear without wheezing or rhonchi Cardiovascular  Auscultation:  Regular rate, without rubs, murmurs or gallops  Edema/varicosities:  Not grossly evident Abdominal  Soft,nontender, without masses, guarding or rebound.  Liver/spleen:  No organomegaly noted  Hernia:  None appreciated  Skin  Inspection:  Grossly normal   Breasts: Examined lying and sitting.   Right: Without masses, retractions, discharge or axillary adenopathy.   Left: Without masses, retractions, discharge or axillary adenopathy. Genitourinary   Inguinal/mons:  Normal without inguinal adenopathy  External genitalia:  Normal appearing vulva with no masses, tenderness, or lesions  BUS/Urethra/Skene's  glands:  Normal  Vagina:  Normal appearing with normal color and discharge, no lesions. Atrophic changes  Cervix:  Normal appearing without discharge or lesions  Uterus:  Normal in size, shape and contour.  Midline and mobile, nontender  Adnexa/parametria:     Rt: Normal in size, without masses or tenderness.   Lt: Normal in size, without masses or tenderness.  Anus and perineum: Normal  Digital rectal exam: Normal sphincter tone without palpated masses or tenderness  Patient informed chaperone available to be present for breast and pelvic exam. Patient has requested no chaperone to be present. Patient has been advised what will be completed during breast and pelvic exam.   Assessment/Plan:  56 y.o. G0 for annual exam.   Well female exam with routine gynecological exam - Education provided on SBEs, importance of preventative screenings, current guidelines, high calcium diet, regular exercise, and multivitamin daily. Labs with PCP and endocrinology.   Osteopenia of neck of right femur - T-score -1.04 February 2020. Continue Vitamin D supplement, high-calcium diet and regular exercise. Will repeat DXA next year.   Postmenopausal - No HRT, no bleeding.   Screening for cervical cancer - Normal Pap history.  Pap today.   Screening for breast cancer - Normal mammogram history. Continue annual screenings. Normal breast exam today .   Screening for colon cancer - 2021 colonoscopy. Will repeat at GI's recommended interval.   Follow up in 1 year for annual.      2022 [facility], 8:42 AM 02/01/2022

## 2022-02-07 LAB — CYTOLOGY - PAP
Comment: NEGATIVE
Diagnosis: NEGATIVE
High risk HPV: NEGATIVE

## 2022-03-13 ENCOUNTER — Other Ambulatory Visit: Payer: Self-pay | Admitting: Cardiology

## 2022-03-17 ENCOUNTER — Ambulatory Visit: Payer: Managed Care, Other (non HMO) | Admitting: Cardiology

## 2022-03-17 ENCOUNTER — Encounter: Payer: Self-pay | Admitting: Cardiology

## 2022-03-17 VITALS — BP 107/76 | HR 64 | Temp 97.9°F | Resp 16 | Ht 67.0 in | Wt 231.2 lb

## 2022-03-17 DIAGNOSIS — Q263 Partial anomalous pulmonary venous connection: Secondary | ICD-10-CM

## 2022-03-17 DIAGNOSIS — Q231 Congenital insufficiency of aortic valve: Secondary | ICD-10-CM

## 2022-03-17 DIAGNOSIS — I7121 Aneurysm of the ascending aorta, without rupture: Secondary | ICD-10-CM

## 2022-03-17 NOTE — Progress Notes (Signed)
EKG normal sinus rhythm with rate of 57 bpm, incomplete right bundle branch block.  Normal EKG.

## 2022-03-17 NOTE — Progress Notes (Signed)
Primary Physician/Referring:  Biagio Borg, MD  Patient ID: Christoper Allegra, female    DOB: [birth year], 56 y.o.   MRN: [id number]  No chief complaint on file.  HPI:    ANJU SERENO  is a 56 y.o. 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 Denies chest pain or shortness of breath.  Tolerating losartan well.  She remains asymptomatic.  Past Medical History:  Diagnosis Date   Allergic rhinitis 12/26/2014   Aortic aneurysm ([place]) 2018   Bicuspid aortic valve 12/14/2020   Endometriosis 1990's   Hypothyroid    Migraines    Past Surgical History:  Procedure Laterality Date   APPENDECTOMY     CHOLECYSTECTOMY     OTHER SURGICAL HISTORY     surgery for Endometriosis   TEE WITHOUT CARDIOVERSION N/A 03/22/2017   Procedure: TRANSESOPHAGEAL ECHOCARDIOGRAM (TEE);  Surgeon: Adrian Prows, MD;  Location: [facility] ENDOSCOPY;  Service: Cardiovascular;  Laterality: N/A;   Family History  Problem Relation Age of Onset   Diabetes Mother    Healthy Father    Thyroid disease Maternal Aunt    Multiple sclerosis Maternal Grandmother    Heart disease Maternal Grandfather    Cancer Paternal Grandmother        melanoma   Heart disease Paternal Grandfather    Thyroid disease Cousin     Social History   Tobacco Use   Smoking status: Never   Smokeless tobacco: Never  Substance Use Topics   Alcohol use: Yes    Comment: Rare   Marital Status: Married  ROS  Review of Systems  Cardiovascular:  Negative for chest pain, dyspnea on exertion, leg swelling and syncope.  Gastrointestinal:  Negative for melena.    Objective  Blood pressure 107/76, pulse 64, temperature 97.9 F (36.6 C), temperature source  Temporal, resp. rate 16, height 5\' 7"  (1.702 m), weight 231 lb 3.2 oz (104.9 kg), last menstrual period 10/03/2008, SpO2 98 %.      03/17/2022    8:39 AM 02/01/2022    8:18 AM 12/15/2021    2:44 PM  Vitals with BMI  Height 5\' 7"  5\' 7"  5\' 8"   Weight 231 lbs 3 oz 233 lbs 234 lbs 10 oz  BMI 36.2 62.70 35.00  Systolic 938 182 993  Diastolic 76 82 80  Pulse 64 68 71     Physical Exam Constitutional:      Appearance: She is obese.  Neck:     Vascular: No carotid bruit or JVD.  Cardiovascular:     Rate and Rhythm: Normal rate and regular rhythm.     Pulses: Normal pulses and intact distal pulses.     Heart sounds: S1 normal and S2 normal. Murmur heard.     Early systolic murmur is present with a grade of 2/6 at the upper right sternal border.     No gallop.  Pulmonary:     Effort: Pulmonary effort is normal. No accessory muscle usage.     Breath sounds: Normal breath sounds.  Abdominal:     General: Bowel sounds are normal.     Palpations: Abdomen is soft.  Musculoskeletal:     Right lower leg: No edema.     Left lower leg: No  edema.    Laboratory examination:   Lab Results  Component Value Date   NA 141 07/25/2021   K 4.5 07/25/2021   CO2 33 (H) 07/25/2021   GLUCOSE 95 07/25/2021   BUN 13 07/25/2021   CREATININE 0.71 07/25/2021   CALCIUM 9.9 07/25/2021   GFRNONAA >60 11/18/2008       Latest Ref Rng & Units 07/25/2021    9:16 AM 07/13/2020    9:55 AM 04/20/2020    8:31 AM  CMP  Glucose 70 - 99 mg/dL 95  93  92   BUN 6 - 23 mg/dL 13  11  8    Creatinine 0.40 - 1.20 mg/dL  4.96  7.59   Sodium 135 - 145 mEq/L 141  140  140   Potassium 3.5 - 5.1 mEq/L 4.5  4.1  4.2   Chloride 96 - 112 mEq/L 105  106  105   CO2 19 - 32 mEq/L 33  30  27   Calcium 8.4 - 10.5 mg/dL 9.9  1.63  9.6   Total Protein 6.0 - 8.3 g/dL 7.2  6.8  6.8   Total Bilirubin 0.2 - 1.2 mg/dL 0.9  1.0  1.2   Alkaline Phos 39 - 117 U/L 69  58  67   AST 0 - 37 U/L 18  24  17    ALT 0 - 35 U/L 24  33  22        Latest Ref Rng & Units 07/25/2021    9:16 AM 07/13/2020    9:55 AM 04/20/2020    8:31 AM  CBC  WBC 4.0 - 10.5 K/uL 5.0  6.1  9.0   Hemoglobin 12.0 - 15.0 g/dL 09/10/2020  04/22/2020  65.9   Hematocrit 36.0 - 46.0 % 39.9  38.6  37.6   Platelets 150.0 - 400.0 K/uL 289.0  298.0  296.0    Lipid Panel Recent Labs    07/25/21 0916  CHOL 176  TRIG 75.0  LDLCALC 98  VLDL 15.0  HDL 63.50  CHOLHDL 3       Component Value Date/Time   CHOL 176 07/25/2021 0916   TRIG 75.0 07/25/2021 0916   HDL 63.50 07/25/2021 0916   CHOLHDL 3 07/25/2021 0916   VLDL 15.0 07/25/2021 0916   LDLCALC 98 07/25/2021 0916   HEMOGLOBIN A1C Lab Results  Component Value Date   HGBA1C 5.5 11/13/2014   TSH Recent Labs    07/25/21 0916 11/28/21 0833  TSH 1.32 2.32   Medications and allergies   Allergies  Allergen Reactions   Vicodin [Hydrocodone-Acetaminophen] Nausea And Vomiting    Current Outpatient Medications  Medication Instructions   Ascorbic Acid (VITAMIN C) 1000 MG tablet SMARTSIG:1 By Mouth   Cholecalciferol (VITAMIN D-3 PO) 2,000 Units, Oral   levothyroxine (SYNTHROID) 125 MCG tablet TAKE 1 TABLET DAILY   liothyronine (CYTOMEL) 5 MCG tablet TAKE 1 TABLET DAILY   losartan (COZAAR) 100 mg, Oral, Daily   Omega-3 Fatty Acids (FISH OIL) 1000 MG CAPS Oral   pantoprazole (PROTONIX) 40 mg, Oral, Daily   vitamin B-12 (CYANOCOBALAMIN) 100 mcg, Oral, Daily   Radiology:   CTA chest 11/23/2021: Grossly stable 4.2 cm ascending thoracic aortic aneurysm. Recommend annual imaging followup by CTA or MRA No significant extracardiac abnormality.  Normal cardiac size and pericardial structure.  Cardiac Studies:   Treadmill stress test 04/16/17: Indication: Chest pain The patient exercised on Bruce protocol. Exercise time was 06:59 min . Patient achieved maximum HR of 158  BPM. The target heart rate was 111%, 85% of 94% . Resting EKG demonstrated NSR. Stress EKG: ST Changes: Depression upsloping, noted at  pea/k exercise but back to baseline immediately into recovery at < 1 minute. Arrhythmias: ventricular premature beats-isolated. Chest Pain: non-limiting. BP Response to Exercise: Normal resting BP- appropriate response. HR Response to Exercise: Appropriat.e. Exercise capacity was normal. Overall Impression: Normal stress test. Exercise capacity was normal. Suspect GERD to be etiology for cough and chest pain with exercise. Clinical correlation recommended. Low risk stress EKG.  PCV ECHOCARDIOGRAM COMPLETE 11/28/2021  Left ventricle cavity is normal in size and wall thickness. Normal global wall motion. Normal LV systolic function with EF 56%. Normal diastolic filling pattern. Right ventricle cavity is upper limit normal in size. Normal right ventricular function. Aortic valve not well visualized, previously reported bicuspid.  Mild aortic stenosis. Vmax 2.1 m/sec, mean PG 10 mmHg, AVA 1.8 cm by continuity equation. Mild tricuspid regurgitation. Estimated pulmonary artery systolic pressure 26 mmHg. Ascending aortic aneurysm measuring 4.5 cm. Previous study on 12/24/2020 reported ascending aorta at 4.2 cm. Recent CTA reported ascending 4.2 cm ascending aorta aneurysm. Overall, this may represent marginal variation, but overall stable ascending aorta aneurysm.  EKG  EKG normal sinus rhythm with rate of 57 bpm, incomplete right bundle branch block.  Normal EKG. Compared to 12/15/2021, probably ectopic atrial rhythm versus frequent PACs not present.  Otherwise no change.   Assessment     ICD-10-CM   1. Bicuspid aortic valve  Q23.1 EKG 12-Lead    PCV ECHOCARDIOGRAM COMPLETE    2. Aneurysm of ascending aorta without rupture (HCC)  I71.21 PCV ECHOCARDIOGRAM COMPLETE    3. 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  Q26.3        Recommendations:   GINI CAPUTO  is a 56 y.o. female  with bicuspid AV, ascending thoracic aortic aneurysm measured at 4.2-4.5 cm.,  Partial  anomalous pulmonary venous return involving the left pulmonary vein, she is essentially asymptomatic presents here 39-month office visit, on her last office visit she had what appeared to be ectopic atrial rhythm.  She remains asymptomatic.  She is tolerating losartan without any side effects.  No change in her physical exam.  I will schedule her for repeat echocardiogram in 1 year from now and I will see her back at that time.  Sending aortic aneurysm size has remained stable over the last 2 to 3 years, we could even consider screening her/imaging her every other year.  EKG today reveals normal sinus rhythm.  Probably ectopic atrial rhythm or frequent PACs may have been the reason for her previously abnormal EKG and probably nonspecific.  Echocardiogram also had revealed borderline RV enlargement, probably related to obesity, will follow-up on next echocardiogram in a year.      Yates Decamp, MD, [facility] 03/17/2022, 8:57 AM Office: [phone] Fax: [phone] Pager: [phone]

## 2022-04-05 ENCOUNTER — Other Ambulatory Visit: Payer: Self-pay

## 2022-04-05 MED ORDER — LOSARTAN POTASSIUM 100 MG PO TABS: 100.0000 mg | ORAL_TABLET | Freq: Every day | ORAL | 3 refills | Status: DC

## 2022-04-10 ENCOUNTER — Other Ambulatory Visit: Payer: Self-pay

## 2022-04-10 MED ORDER — LOSARTAN POTASSIUM 100 MG PO TABS: 100.0000 mg | ORAL_TABLET | Freq: Every day | ORAL | 3 refills | Status: DC

## 2022-06-07 ENCOUNTER — Other Ambulatory Visit: Payer: Self-pay | Admitting: Endocrinology

## 2022-06-27 ENCOUNTER — Encounter: Payer: Self-pay | Admitting: Internal Medicine

## 2022-06-27 ENCOUNTER — Ambulatory Visit: Payer: Managed Care, Other (non HMO) | Admitting: Internal Medicine

## 2022-06-27 VITALS — BP 120/74 | HR 70 | Temp 98.5°F | Ht 67.0 in | Wt 219.0 lb

## 2022-06-27 DIAGNOSIS — E7849 Other hyperlipidemia: Secondary | ICD-10-CM

## 2022-06-27 DIAGNOSIS — Z860100 Personal history of colon polyps, unspecified: Secondary | ICD-10-CM | POA: Insufficient documentation

## 2022-06-27 DIAGNOSIS — Z0001 Encounter for general adult medical examination with abnormal findings: Secondary | ICD-10-CM

## 2022-06-27 DIAGNOSIS — R739 Hyperglycemia, unspecified: Secondary | ICD-10-CM | POA: Diagnosis not present

## 2022-06-27 DIAGNOSIS — R229 Localized swelling, mass and lump, unspecified: Secondary | ICD-10-CM

## 2022-06-27 DIAGNOSIS — E559 Vitamin D deficiency, unspecified: Secondary | ICD-10-CM

## 2022-06-27 DIAGNOSIS — E538 Deficiency of other specified B group vitamins: Secondary | ICD-10-CM

## 2022-06-27 DIAGNOSIS — E039 Hypothyroidism, unspecified: Secondary | ICD-10-CM

## 2022-06-27 LAB — CBC WITH DIFFERENTIAL/PLATELET
Basophils Absolute: 0.1 10*3/uL (ref 0.0–0.1)
Basophils Relative: 0.7 % (ref 0.0–3.0)
Eosinophils Absolute: 0.2 10*3/uL (ref 0.0–0.7)
Eosinophils Relative: 2.6 % (ref 0.0–5.0)
HCT: 41.6 % (ref 36.0–46.0)
Hemoglobin: 13.9 g/dL (ref 12.0–15.0)
Lymphocytes Relative: 39.2 % (ref 12.0–46.0)
Lymphs Abs: 3 10*3/uL (ref 0.7–4.0)
MCHC: 33.4 g/dL (ref 30.0–36.0)
MCV: 95 fl (ref 78.0–100.0)
Monocytes Absolute: 0.6 10*3/uL (ref 0.1–1.0)
Monocytes Relative: 7.9 % (ref 3.0–12.0)
Neutro Abs: 3.8 10*3/uL (ref 1.4–7.7)
Neutrophils Relative %: 49.6 % (ref 43.0–77.0)
Platelets: 314 10*3/uL (ref 150.0–400.0)
RBC: 4.38 Mil/uL (ref 3.87–5.11)
RDW: 13.1 % (ref 11.5–15.5)
WBC: 7.6 10*3/uL (ref 4.0–10.5)

## 2022-06-27 LAB — TSH: TSH: 2.12 u[IU]/mL (ref 0.35–5.50)

## 2022-06-27 LAB — BASIC METABOLIC PANEL
BUN: 15 mg/dL (ref 6–23)
CO2: 29 mEq/L (ref 19–32)
Calcium: 10.5 mg/dL (ref 8.4–10.5)
Chloride: 101 mEq/L (ref 96–112)
Creatinine, Ser: 0.82 mg/dL (ref 0.40–1.20)
GFR: 79.11 mL/min (ref >60.00)
Glucose, Bld: 92 mg/dL (ref 70–99)
Potassium: 4.2 mEq/L (ref 3.5–5.1)
Sodium: 138 mEq/L (ref 135–145)

## 2022-06-27 LAB — HEPATIC FUNCTION PANEL
ALT: 25 U/L (ref 0–35)
AST: 19 U/L (ref 0–37)
Albumin: 4.5 g/dL (ref 3.5–5.2)
Alkaline Phosphatase: 75 U/L (ref 39–117)
Bilirubin, Direct: 0.2 mg/dL (ref 0.0–0.3)
Total Bilirubin: 1.1 mg/dL (ref 0.2–1.2)
Total Protein: 7.4 g/dL (ref 6.0–8.3)

## 2022-06-27 LAB — LIPID PANEL
Cholesterol: 211 mg/dL — ABNORMAL HIGH (ref 0–200)
HDL: 72.5 mg/dL (ref >39.00)
LDL Cholesterol: 122 mg/dL — ABNORMAL HIGH (ref 0–99)
NonHDL: 138.94
Total CHOL/HDL Ratio: 3
Triglycerides: 84 mg/dL (ref 0.0–149.0)
VLDL: 16.8 mg/dL (ref 0.0–40.0)

## 2022-06-27 LAB — HEMOGLOBIN A1C: Hgb A1c MFr Bld: 5.7 % (ref 4.6–6.5)

## 2022-06-27 LAB — T4, FREE: Free T4: 1.03 ng/dL (ref 0.60–1.60)

## 2022-06-27 LAB — VITAMIN D 25 HYDROXY (VIT D DEFICIENCY, FRACTURES): VITD: 49.36 ng/mL (ref 30.00–100.00)

## 2022-06-27 LAB — VITAMIN B12: Vitamin B-12: 574 pg/mL (ref 211–911)

## 2022-06-27 NOTE — Assessment & Plan Note (Signed)
Last vitamin D Lab Results  Component Value Date   VD25OH 57.61 11/28/2021   Stable, cont oral replacement

## 2022-06-27 NOTE — Assessment & Plan Note (Signed)
Likely benign but possibly enlarging, ok for general surgury referral

## 2022-06-27 NOTE — Assessment & Plan Note (Signed)
Lab Results  Component Value Date   LDLCALC 98 07/25/2021   Stable, pt to continue current low chol diet, for f/u lab, consider statin for ldl > 100

## 2022-06-27 NOTE — Assessment & Plan Note (Signed)
Lab Results  Component Value Date   TSH 2.32 11/28/2021   Stable, pt to continue levothyroxine 125 mcg

## 2022-06-27 NOTE — Progress Notes (Signed)
Patient ID: Olivia Mendez, female   DOB: [birth year], 57 y.o.   MRN: [id number]         Chief Complaint:: wellness exam and knot on right side of hip  , low b12, hld, low thyroid, low vit d       HPI:  Olivia Mendez is a 57 y.o. female here for wellness exam - deciens covid booster, o/w up to date                Also Pt denies chest pain, increased sob or doe, wheezing, orthopnea, PND, increased LE swelling, palpitations, dizziness or syncope.   Pt denies polydipsia, polyuria, or new focal neuro s/s.    Pt denies fever, wt loss, night sweats, loss of appetite, or other constitutional symptoms Also has a subq nodule to right lateral hip area above the ischium, possibly getting enlarged, firm.     Wt Readings from Last 3 Encounters:  06/27/22 219 lb (99.3 kg)  03/17/22 231 lb 3.2 oz (104.9 kg)  02/01/22 233 lb (105.7 kg)   BP Readings from Last 3 Encounters:  06/27/22 120/74  03/17/22 107/76  02/01/22 112/82   Immunization History  Administered Date(s) Administered   Influenza,inj,Quad PF,6+ Mos 02/20/2020   Influenza,inj,quad, With Preservative 03/14/2022   Influenza-Unspecified 03/20/2019, 04/11/2021   PFIZER(Purple Top)SARS-COV-2 Vaccination 08/18/2019, 09/08/2019, 04/11/2020, 10/16/2020, 02/26/2021   Td 09/14/2008   Tdap 12/05/2016   Zoster Recombinat (Shingrix) 07/13/2020, 09/20/2020  There are no preventive care reminders to display for this patient.    Past Medical History:  Diagnosis Date   Allergic rhinitis 12/26/2014   Aortic aneurysm ([place]) 2018   Bicuspid aortic valve 12/14/2020   Endometriosis 1990's   Hypothyroid    Migraines    Past Surgical History:  Procedure Laterality Date   APPENDECTOMY     CHOLECYSTECTOMY     OTHER SURGICAL HISTORY     surgery for Endometriosis   TEE WITHOUT CARDIOVERSION N/A 03/22/2017   Procedure: TRANSESOPHAGEAL ECHOCARDIOGRAM (TEE);  Surgeon: Adrian Prows, MD;  Location: [facility] ENDOSCOPY;  Service: Cardiovascular;  Laterality: N/A;     reports that she has never smoked. She has never used smokeless tobacco. She reports current alcohol use. She reports that she does not use drugs. family history includes Cancer in her paternal grandmother; Diabetes in her mother; Healthy in her father; Heart disease in her maternal grandfather and paternal grandfather; Multiple sclerosis in her maternal grandmother; Thyroid disease in her cousin and maternal aunt. Allergies  Allergen Reactions   Vicodin [Hydrocodone-Acetaminophen] Nausea And Vomiting   Current Outpatient Medications on File Prior to Visit  Medication Sig Dispense Refill   Ascorbic Acid (VITAMIN C) 1000 MG tablet SMARTSIG:1 By Mouth     Cholecalciferol (VITAMIN D-3 PO) Take 2,000 Units by mouth.     levothyroxine (SYNTHROID) 125 MCG tablet TAKE 1 TABLET DAILY 90 tablet 3   liothyronine (CYTOMEL) 5 MCG tablet TAKE 1 TABLET DAILY 90 tablet 3   losartan (COZAAR) 100 MG tablet Take 1 tablet (100 mg total) by mouth daily. 90 tablet 3   Omega-3 Fatty Acids (FISH OIL) 1000 MG CAPS Take by mouth.     pantoprazole (PROTONIX) 40 MG tablet Take 1 tablet (40 mg total) by mouth daily. 90 tablet 3   vitamin B-12 (CYANOCOBALAMIN) 100 MCG tablet Take 100 mcg by mouth daily.     B Complex Vitamins (VITAMIN B COMPLEX) TABS Orally     Cholecalciferol 50 MCG (2000 UT) CAPS 1 capsule Orally Once  a day for 30 day(s)     No current facility-administered medications on file prior to visit.        ROS:  All others reviewed and negative.  Objective        PE:  BP 120/74 (BP Location: Left Arm, Patient Position: Sitting, Cuff Size: Large)   Pulse 70   Temp 98.5 F (36.9 C) (Oral)   Ht 5\' 7"  (1.702 m)   Wt 219 lb (99.3 kg)   LMP 10/03/2008   SpO2 97%   BMI 34.30 kg/m                 Constitutional: Pt appears in NAD               HENT: Head: NCAT.                Right Ear: External ear normal.                 Left Ear: External ear normal.                Eyes: . Pupils are equal, round,  and reactive to light. Conjunctivae and EOM are normal               Nose: without d/c or deformity               Neck: Neck supple. Gross normal ROM               Cardiovascular: Normal rate and regular rhythm.                 Pulmonary/Chest: Effort normal and breath sounds without rales or wheezing.                Abd:  Soft, NT, ND, + BS, no organomegaly               Neurological: Pt is alert. At baseline orientation, motor grossly intact               Skin:  LE edema - none, right side above the ischium is subq nodule firm but not hard, non mobile               Psychiatric: Pt behavior is normal without agitation   Micro: none  Cardiac tracings I have personally interpreted today:  none  Pertinent Radiological findings (summarize): none   Lab Results  Component Value Date   WBC 5.0 07/25/2021   HGB 13.5 07/25/2021   HCT 39.9 07/25/2021   PLT 289.0 07/25/2021   GLUCOSE 95 07/25/2021   CHOL 176 07/25/2021   TRIG 75.0 07/25/2021   HDL 63.50 07/25/2021   LDLCALC 98 07/25/2021   ALT 24 07/25/2021   AST 18 07/25/2021   NA 141 07/25/2021   K 4.5 07/25/2021   CL 105 07/25/2021   CREATININE 0.71 07/25/2021   BUN 13 07/25/2021   CO2 33 (H) 07/25/2021   TSH 2.32 11/28/2021   INR 0.9 ratio 09/14/2008   HGBA1C 5.5 11/13/2014   Assessment/Plan:  Olivia Mendez is a 57 y.o. White or Caucasian [1] female with  has a past medical history of Allergic rhinitis (12/26/2014), Aortic aneurysm ([facility]) (2018), Bicuspid aortic valve (12/14/2020), Endometriosis (1990's), Hypothyroid, and Migraines.  Encounter for well adult exam with abnormal findings Age and sex appropriate education and counseling updated with regular exercise and diet Referrals for preventative services - none needed Immunizations addressed - declines covid booster Smoking counseling  -  none needed Evidence for depression or other mood disorder - none significant Most recent labs reviewed. I have personally reviewed and  have noted: 1) the patient's medical and social history 2) The patient's current medications and supplements 3) The patient's height, weight, and BMI have been recorded in the chart   B12 deficiency Lab Results  Component Value Date   VITAMINB12 293 07/25/2021   Stable, cont oral replacement - b12 1000 mcg qd   HLD (hyperlipidemia) Lab Results  Component Value Date   LDLCALC 98 07/25/2021   Stable, pt to continue current low chol diet, for f/u lab, consider statin for ldl > 100   Hypothyroidism Lab Results  Component Value Date   TSH 2.32 11/28/2021   Stable, pt to continue levothyroxine 125 mcg   Vitamin D deficiency Last vitamin D Lab Results  Component Value Date   VD25OH 57.61 11/28/2021   Stable, cont oral replacement   Skin nodule Likely benign but possibly enlarging, ok for general surgury referral Followup: Return in about 6 months (around 12/26/2022).  Cathlean Cower, MD 06/27/2022 8:28 PM [facility]

## 2022-06-27 NOTE — Assessment & Plan Note (Signed)

## 2022-06-27 NOTE — Assessment & Plan Note (Signed)
Lab Results  Component Value Date   VITAMINB12 293 07/25/2021   Stable, cont oral replacement - b12 1000 mcg qd

## 2022-06-27 NOTE — Patient Instructions (Signed)
Please continue all other medications as before, and refills have been done if requested.  Please have the pharmacy call with any other refills you may need.  Please continue your efforts at being more active, low cholesterol diet, and weight control.  You are otherwise up to date with prevention measures today.  Please keep your appointments with your specialists as you may have planned - Dr Dwyane Dee or other endo  You will be contacted regarding the referral for: General Surgury  Please go to the LAB at the blood drawing area for the tests to be done  You will be contacted by phone if any changes need to be made immediately.  Otherwise, you will receive a letter about your results with an explanation, but please check with MyChart first.  Please remember to sign up for MyChart if you have not done so, as this will be important to you in the future with finding out test results, communicating by private email, and scheduling acute appointments online when needed.  Please make an Appointment to return in 6 months, or sooner if needed

## 2022-06-28 LAB — URINALYSIS, ROUTINE W REFLEX MICROSCOPIC
Bilirubin Urine: NEGATIVE
Hgb urine dipstick: NEGATIVE
Nitrite: NEGATIVE
RBC / HPF: NONE SEEN (ref 0–?)
Specific Gravity, Urine: >=1.03 — AB (ref 1.000–1.030)
Total Protein, Urine: NEGATIVE
Urine Glucose: NEGATIVE
Urobilinogen, UA: 0.2 (ref 0.0–1.0)
pH: 5.5 (ref 5.0–8.0)

## 2022-07-08 ENCOUNTER — Encounter: Payer: Self-pay | Admitting: Emergency Medicine

## 2022-07-08 ENCOUNTER — Ambulatory Visit
Admission: EM | Admit: 2022-07-08 | Discharge: 2022-07-08 | Disposition: A | Discharge status: Home / Self Care | Payer: Managed Care, Other (non HMO) | Attending: Emergency Medicine | Admitting: Emergency Medicine

## 2022-07-08 DIAGNOSIS — J069 Acute upper respiratory infection, unspecified: Secondary | ICD-10-CM | POA: Diagnosis not present

## 2022-07-08 MED ORDER — AZITHROMYCIN 250 MG PO TABS: 250.0000 mg | ORAL_TABLET | Freq: Every day | ORAL | 0 refills | Status: DC

## 2022-07-08 NOTE — Discharge Instructions (Signed)
Your symptoms today are most likely being caused by a virus and should steadily improve in time it can take up to 7 to 10 days before you truly start to see a turnaround however things will get better  You may take azithromycin prophylactically to prevent bacteria from entering the body causing symptoms to worsen  Continue use of your over-the-counter medications for management of your symptoms, may also attempt any of the following below    You can take Tylenol and/or Ibuprofen as needed for fever reduction and pain relief.   For cough: honey 1/2 to 1 teaspoon (you can dilute the honey in water or another fluid).  You can also use guaifenesin and dextromethorphan for cough. You can use a humidifier for chest congestion and cough.  If you don't have a humidifier, you can sit in the bathroom with the hot shower running.      For sore throat: try warm salt water gargles, cepacol lozenges, throat spray, warm tea or water with lemon/honey, popsicles or ice, or OTC cold relief medicine for throat discomfort.   For congestion: take a daily anti-histamine like Zyrtec, Claritin, and a oral decongestant, such as pseudoephedrine.  You can also use Flonase 1-2 sprays in each nostril daily.   It is important to stay hydrated: drink plenty of fluids (water, gatorade/powerade/pedialyte, juices, or teas) to keep your throat moisturized and help further relieve irritation/discomfort.

## 2022-07-08 NOTE — ED Triage Notes (Signed)
Pt presents with a cough, sore throat, head congestion and headache x 3 days.

## 2022-07-08 NOTE — ED Provider Notes (Signed)
Olivia Mendez    CSN: [id number] Arrival date & time: 07/08/22  5784      History   Chief Complaint Chief Complaint  Patient presents with   Cough   Sore Throat   Headache   Nasal Congestion    HPI Olivia Mendez is a 57 y.o. female.   Patient presents for evaluation of nasal congestion, rhinorrhea, sore throat and productive cough with green sputum for 3 days.  He experienced left ear pain for approximately 1 day before spontaneous resolution.  Tolerating food and liquids.  Known sick contact within household.  Who had tested negative for COVID with home test 4 times.  Has attempted use of guaifenesin, Delsym, phenylephrine.  Denies respiratory history.  Non-smoker.  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/18/2017   Pain in left finger(s) 02/21/2017   Finger pain, left 02/07/2017   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/05/2015   Acute upper respiratory infection 05/05/2015   Lower leg edema 11/13/2014   Fatigue 11/13/2014   Tick bite 69/62/9528   HELICOBACTER PYLORI INFECTION 02/25/2009   ESOPHAGEAL STRICTURE 02/25/2009   HIATAL HERNIA 02/25/2009   DIVERTICULOSIS, COLON 02/25/2009   GERD 12/01/2008   Anxiety state 11/26/2008   CHEST PAIN 11/26/2008   Hypothyroidism 09/14/2008   HLD (hyperlipidemia) 09/14/2008   BACK PAIN 09/14/2008    Past Surgical History:  Procedure Laterality Date   APPENDECTOMY      CHOLECYSTECTOMY     OTHER SURGICAL HISTORY     surgery for Endometriosis   TEE WITHOUT CARDIOVERSION N/A 03/22/2017   Procedure: TRANSESOPHAGEAL ECHOCARDIOGRAM (TEE);  Surgeon: Adrian Prows, MD;  Location: [facility] ENDOSCOPY;  Service: Cardiovascular;  Laterality: N/A;    OB History     Gravida  0   Para  0   Term  0   Preterm  0   AB  0   Living  0      SAB  0   IAB  0   Ectopic  0   Multiple  0   Live Births  0            Home Medications    Prior to Admission medications   Medication Sig Start Date End Date Taking? Authorizing Provider  azithromycin (ZITHROMAX) 250 MG tablet Take 1 tablet (250 mg total) by mouth daily. Take first 2 tablets together, then 1 every day until finished. 07/08/22  Yes Hans Eden, NP  Ascorbic Acid (VITAMIN C) 1000 MG tablet SMARTSIG:1 By Mouth 06/08/21   [provider]  B Complex Vitamins (VITAMIN B COMPLEX) TABS Orally    [provider]  Cholecalciferol (VITAMIN D-3 PO) Take 2,000 Units by mouth.    [provider]  Cholecalciferol 50 MCG (2000 UT) CAPS 1 capsule Orally Once a day for 30 day(s)    [provider]  levothyroxine (SYNTHROID) 125 MCG tablet TAKE 1 TABLET  DAILY 01/17/22   Elayne Snare, MD  liothyronine (CYTOMEL) 5 MCG tablet TAKE 1 TABLET DAILY 06/07/22   Elayne Snare, MD  losartan (COZAAR) 100 MG tablet Take 1 tablet (100 mg total) by mouth daily. 04/10/22   Adrian Prows, MD  Omega-3 Fatty Acids (FISH OIL) 1000 MG CAPS Take by mouth.    [provider]  pantoprazole (PROTONIX) 40 MG tablet Take 1 tablet (40 mg total) by mouth daily. 07/25/21   Biagio Borg, MD  vitamin B-12 (CYANOCOBALAMIN) 100 MCG tablet Take 100 mcg by mouth daily.    [provider]    Family History Family History  Problem Relation Age of Onset   Diabetes Mother    Healthy Father    Thyroid disease Maternal Aunt    Multiple sclerosis Maternal Grandmother    Heart disease Maternal Grandfather     Cancer Paternal Grandmother        melanoma   Heart disease Paternal Grandfather    Thyroid disease Cousin     Social History Social History   Tobacco Use   Smoking status: Never   Smokeless tobacco: Never  Vaping Use   Vaping Use: Never used  Substance Use Topics   Alcohol use: Yes    Comment: Rare   Drug use: No     Allergies   Vicodin [hydrocodone-acetaminophen]   Review of Systems Review of Systems Defer to HPI    Physical Exam Triage Vital Signs ED Triage Vitals  Enc Vitals Group     BP 07/08/22 1009 139/79     Pulse Rate 07/08/22 1009 61     Resp 07/08/22 1009 18     Temp 07/08/22 1009 98.2 F (36.8 C)     Temp Source 07/08/22 1009 Oral     SpO2 07/08/22 1009 97 %     Weight --      Height --      Head Circumference --      Peak Flow --      Pain Score 07/08/22 1013 0     Pain Loc --      Pain Edu? --      Excl. in [place]? --    No data found.  Updated Vital Signs BP 139/79 (BP Location: Left Arm)   Pulse 61   Temp 98.2 F (36.8 C) (Oral)   Resp 18   LMP 10/03/2008   SpO2 97%   Visual Acuity Right Eye Distance:   Left Eye Distance:   Bilateral Distance:    Right Eye Near:   Left Eye Near:    Bilateral Near:     Physical Exam Constitutional:      Appearance: Normal appearance. She is well-developed.  HENT:     Head: Normocephalic.     Right Ear: Tympanic membrane and ear canal normal.     Left Ear: Tympanic membrane and ear canal normal.     Nose: Congestion and rhinorrhea present.     Mouth/Throat:     Mouth: Mucous membranes are moist.     Pharynx: No posterior oropharyngeal erythema.     Tonsils: No tonsillar exudate. 0 on the right. 0 on the left.  Cardiovascular:     Rate and Rhythm: Normal rate and regular rhythm.     Pulses: Normal pulses.     Heart sounds: Normal heart sounds.  Pulmonary:     Effort: Pulmonary effort is normal.     Breath sounds: Normal breath sounds.  Skin:  General: Skin is warm and dry.   Neurological:     General: No focal deficit present.     Mental Status: She is alert and oriented to person, place, and time.  Psychiatric:        Mood and Affect: Mood normal.        Behavior: Behavior normal.      UC Treatments / Results  Labs (all labs ordered are listed, but only abnormal results are displayed) Labs Reviewed - No data to display  EKG   Radiology No results found.  Procedures Procedures (including critical care time)  Medications Ordered in UC Medications - No data to display  Initial Impression / Assessment and Plan / UC Course  I have reviewed the triage vital signs and the nursing notes.  Pertinent labs & imaging results that were available during my care of the patient were reviewed by me and considered in my medical decision making (see chart for details).  Viral URI with cough  Patient is in no signs of distress nor toxic appearing.  Vital signs are stable.  Low suspicion for pneumonia, pneumothorax or bronchitis and therefore will defer imaging.  Prescribed azithromycin prophylactically, endorse that if symptoms continue to persist past use of antibiotic and it is due to symptoms most likely being a viral etiology, verbalized understanding.May use additional over-the-counter medications as needed for supportive care.  May follow-up with urgent care as needed if symptoms persist or worsen.   Final Clinical Impressions(s) / UC Diagnoses   Final diagnoses:  Viral URI with cough     Discharge Instructions      Your symptoms today are most likely being caused by a virus and should steadily improve in time it can take up to 7 to 10 days before you truly start to see a turnaround however things will get better  You may take azithromycin prophylactically to prevent bacteria from entering the body causing symptoms to worsen  Continue use of your over-the-counter medications for management of your symptoms, may also attempt any of the following  below    You can take Tylenol and/or Ibuprofen as needed for fever reduction and pain relief.   For cough: honey 1/2 to 1 teaspoon (you can dilute the honey in water or another fluid).  You can also use guaifenesin and dextromethorphan for cough. You can use a humidifier for chest congestion and cough.  If you don't have a humidifier, you can sit in the bathroom with the hot shower running.      For sore throat: try warm salt water gargles, cepacol lozenges, throat spray, warm tea or water with lemon/honey, popsicles or ice, or OTC cold relief medicine for throat discomfort.   For congestion: take a daily anti-histamine like Zyrtec, Claritin, and a oral decongestant, such as pseudoephedrine.  You can also use Flonase 1-2 sprays in each nostril daily.   It is important to stay hydrated: drink plenty of fluids (water, gatorade/powerade/pedialyte, juices, or teas) to keep your throat moisturized and help further relieve irritation/discomfort.    ED Prescriptions     Medication Sig Dispense Auth. Provider   azithromycin (ZITHROMAX) 250 MG tablet Take 1 tablet (250 mg total) by mouth daily. Take first 2 tablets together, then 1 every day until finished. 6 tablet Hans Eden, NP      PDMP not reviewed this encounter.   Hans Eden, NP 07/08/22 1056

## 2022-07-20 ENCOUNTER — Ambulatory Visit
Admission: EM | Admit: 2022-07-20 | Discharge: 2022-07-20 | Disposition: A | Discharge status: Home / Self Care | Payer: Managed Care, Other (non HMO) | Attending: Emergency Medicine | Admitting: Emergency Medicine

## 2022-07-20 DIAGNOSIS — U071 COVID-19: Secondary | ICD-10-CM | POA: Insufficient documentation

## 2022-07-20 DIAGNOSIS — B349 Viral infection, unspecified: Secondary | ICD-10-CM

## 2022-07-20 NOTE — Discharge Instructions (Addendum)
Your COVID test is pending.    Take Tylenol as needed for fever or discomfort.  Rest and keep yourself hydrated.    Follow-up with your primary care provider if your symptoms are not improving.

## 2022-07-20 NOTE — ED Provider Notes (Signed)
Roderic Palau    CSN: [id number] Arrival date & time: 07/20/22  1754      History   Chief Complaint Chief Complaint  Patient presents with   Nasal Congestion    HPI Olivia Mendez is a 57 y.o. female.  Patient presents with 3 day history of chills, ear pain, congestion, runny nose, postnasal drip, sore throat, cough.  She had diarrhea but none today.  No fever, rash, shortness of breath, abdominal pain, vomiting, or other symptoms.  Treating symptoms with Tylenol, Mucinex, cough syrup.  She was seen at this urgent care on 07/08/2022; diagnosed with viral URI with cough; treated with Zithromax.    The history is provided by the patient and medical records.    Past Medical History:  Diagnosis Date   Allergic rhinitis 12/26/2014   Aortic aneurysm ([place]) 2018   Bicuspid aortic valve 12/14/2020   Endometriosis 1990's   Hypothyroid    Migraines     Patient Active Problem List   Diagnosis Date Noted   Personal history of colonic polyps 06/27/2022   Skin nodule 06/27/2022   Vitamin D deficiency 07/25/2021   B12 deficiency 07/25/2021   Bicuspid aortic valve 12/14/2020   Rash 02/21/2020   Allergic rhinitis 04/09/2019   Viral illness 12/16/2018   Encounter for well adult exam with abnormal findings 02/21/2018   Thoracic ascending aortic aneurysm ([place]) 03/18/2017   Pain in left finger(s) 02/21/2017   Finger pain, left 02/07/2017   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/05/2015   Acute upper respiratory infection 05/05/2015   Lower leg edema 11/13/2014   Fatigue 11/13/2014   Tick bite [id number]   HELICOBACTER PYLORI INFECTION 02/25/2009   ESOPHAGEAL STRICTURE 02/25/2009   HIATAL HERNIA 02/25/2009   DIVERTICULOSIS, COLON 02/25/2009   GERD 12/01/2008   Anxiety state 11/26/2008   CHEST PAIN 11/26/2008   Hypothyroidism 09/14/2008   HLD (hyperlipidemia) 09/14/2008   BACK PAIN 09/14/2008    Past Surgical History:  Procedure Laterality Date   APPENDECTOMY     CHOLECYSTECTOMY      OTHER SURGICAL HISTORY     surgery for Endometriosis   TEE WITHOUT CARDIOVERSION N/A 03/22/2017   Procedure: TRANSESOPHAGEAL ECHOCARDIOGRAM (TEE);  Surgeon: Adrian Prows, MD;  Location: [facility] ENDOSCOPY;  Service: Cardiovascular;  Laterality: N/A;    OB History     Gravida  0   Para  0   Term  0   Preterm  0   AB  0   Living  0      SAB  0   IAB  0   Ectopic  0   Multiple  0   Live Births  0            Home Medications    Prior to Admission medications   Medication Sig Start Date End Date Taking? Authorizing Provider  Ascorbic Acid (VITAMIN C) 1000 MG tablet SMARTSIG:1 By Mouth 06/08/21   [provider]  azithromycin (ZITHROMAX) 250 MG tablet Take 1 tablet (250 mg total) by mouth daily. Take first 2 tablets together, then 1 every day until finished. Patient not taking: Reported on 07/20/2022 07/08/22   Hans Eden, NP  B Complex Vitamins (VITAMIN B COMPLEX) TABS Orally    [provider]  Cholecalciferol (VITAMIN D-3 PO) Take 2,000 Units by mouth.    [provider]  Cholecalciferol 50 MCG (2000 UT) CAPS 1 capsule Orally Once a day for 30 day(s)    [provider]  levothyroxine (SYNTHROID)  125 MCG tablet TAKE 1 TABLET DAILY 01/17/22   Elayne Snare, MD  liothyronine (CYTOMEL) 5 MCG tablet TAKE 1 TABLET DAILY 06/07/22   Elayne Snare, MD  losartan (COZAAR) 100 MG tablet Take 1 tablet (100 mg total) by mouth daily. 04/10/22   Adrian Prows, MD  Omega-3 Fatty Acids (FISH OIL) 1000 MG CAPS Take by mouth.    [provider]  pantoprazole (PROTONIX) 40 MG tablet Take 1 tablet (40 mg total) by mouth daily. 07/25/21   Biagio Borg, MD  vitamin B-12 (CYANOCOBALAMIN) 100 MCG tablet Take 100 mcg by mouth daily.    [provider]    Family History Family History  Problem Relation Age of Onset   Diabetes Mother    Healthy Father    Thyroid disease Maternal Aunt    Multiple sclerosis Maternal Grandmother    Heart disease  Maternal Grandfather    Cancer Paternal Grandmother        melanoma   Heart disease Paternal Grandfather    Thyroid disease Cousin     Social History Social History   Tobacco Use   Smoking status: Never   Smokeless tobacco: Never  Vaping Use   Vaping Use: Never used  Substance Use Topics   Alcohol use: Yes    Comment: Rare   Drug use: No     Allergies   Vicodin [hydrocodone-acetaminophen]   Review of Systems Review of Systems  Constitutional:  Positive for chills. Negative for fever.  HENT:  Positive for congestion, ear pain and sore throat.   Respiratory:  Positive for cough. Negative for shortness of breath.   Cardiovascular:  Negative for chest pain and palpitations.  Gastrointestinal:  Negative for diarrhea and vomiting.  Skin:  Negative for color change and rash.  All other systems reviewed and are negative.    Physical Exam Triage Vital Signs ED Triage Vitals  Enc Vitals Group     BP      Pulse      Resp      Temp      Temp src      SpO2      Weight      Height      Head Circumference      Peak Flow      Pain Score      Pain Loc      Pain Edu?      Excl. in [place]?    No data found.  Updated Vital Signs BP 125/79   Pulse 71   Temp 97.8 F (36.6 C)   Resp 18   LMP 10/03/2008   SpO2 98%   Visual Acuity Right Eye Distance:   Left Eye Distance:   Bilateral Distance:    Right Eye Near:   Left Eye Near:    Bilateral Near:     Physical Exam Vitals and nursing note reviewed.  Constitutional:      General: She is not in acute distress.    Appearance: She is well-developed. She is not ill-appearing.  HENT:     Right Ear: Tympanic membrane normal.     Left Ear: Tympanic membrane normal.     Nose: Nose normal.     Mouth/Throat:     Mouth: Mucous membranes are moist.     Pharynx: Oropharynx is clear.  Cardiovascular:     Rate and Rhythm: Normal rate and regular rhythm.     Heart sounds: Normal heart sounds.  Pulmonary:  Effort:  Pulmonary effort is normal. No respiratory distress.     Breath sounds: Normal breath sounds.  Musculoskeletal:     Cervical back: Neck supple.  Skin:    General: Skin is warm and dry.  Neurological:     Mental Status: She is alert.  Psychiatric:        Mood and Affect: Mood normal.        Behavior: Behavior normal.      UC Treatments / Results  Labs (all labs ordered are listed, but only abnormal results are displayed) Labs Reviewed  SARS CORONAVIRUS 2 (TAT 6-24 HRS)    EKG   Radiology No results found.  Procedures Procedures (including critical care time)  Medications Ordered in UC Medications - No data to display  Initial Impression / Assessment and Plan / UC Course  I have reviewed the triage vital signs and the nursing notes.  Pertinent labs & imaging results that were available during my care of the patient were reviewed by me and considered in my medical decision making (see chart for details).    Viral illness.  COVID pending.  If COVID positive, recommend treatment with molnupiravir (due to risk for hypertension with Paxlovid and patient's history of aneurysm).  Discussed symptomatic treatment including Tylenol, rest, hydration.  Instructed patient to follow up with her PCP if symptoms are not improving.  ED precautions discussed.  She agrees to plan of care.   Final Clinical Impressions(s) / UC Diagnoses   Final diagnoses:  Viral illness     Discharge Instructions      Your COVID test is pending.    Take Tylenol as needed for fever or discomfort.  Rest and keep yourself hydrated.    Follow-up with your primary care provider if your symptoms are not improving.         ED Prescriptions   None    PDMP not reviewed this encounter.   Sharion Balloon, NP 07/20/22 [phone]

## 2022-07-20 NOTE — ED Triage Notes (Signed)
Patient to Urgent Care with complaints of  sinus congestion/ sinus pain. Reports sore throat/ watery eyes/ chills/ coughs/ bilateral ear pain. Denies any known fevers.   Previously seen 2/3. Prescribed a z-pack with some improvement in symptoms. Reports symptoms started again on Monday and have worsened.   Currently taking mucinex/ otc cough syrup/ tylenol.

## 2022-07-21 LAB — SARS CORONAVIRUS 2 (TAT 6-24 HRS): SARS Coronavirus 2: POSITIVE — AB

## 2022-11-24 ENCOUNTER — Other Ambulatory Visit (INDEPENDENT_AMBULATORY_CARE_PROVIDER_SITE_OTHER): Payer: BC Managed Care – PPO

## 2022-11-24 DIAGNOSIS — E063 Autoimmune thyroiditis: Secondary | ICD-10-CM | POA: Diagnosis not present

## 2022-11-24 LAB — T4, FREE: Free T4: 1.38 ng/dL (ref 0.60–1.60)

## 2022-11-24 LAB — TSH: TSH: 0.99 u[IU]/mL (ref 0.35–5.50)

## 2022-11-29 ENCOUNTER — Ambulatory Visit: Payer: BC Managed Care – PPO | Admitting: Endocrinology

## 2022-11-29 VITALS — BP 110/70 | HR 55 | Ht 67.75 in | Wt 215.4 lb

## 2022-11-29 DIAGNOSIS — E063 Autoimmune thyroiditis: Secondary | ICD-10-CM | POA: Diagnosis not present

## 2022-11-29 MED ORDER — LEVOTHYROXINE SODIUM 125 MCG PO TABS: 125.0000 ug | ORAL_TABLET | Freq: Every day | ORAL | 3 refills | Status: DC

## 2022-11-29 MED ORDER — LIOTHYRONINE SODIUM 5 MCG PO TABS: 5.0000 ug | ORAL_TABLET | Freq: Every day | ORAL | 3 refills | Status: DC

## 2022-11-29 NOTE — Progress Notes (Signed)
Patient ID: Olivia Mendez, female   DOB: 1965-09-11, 57 y.o.   MRN: [id number]           Referring Physician: Oliver Barre  Reason for Appointment:  follow-up visit    History of Present Illness:   Hypothyroidism was first diagnosed in 1994  Baseline consultation history: At the time of diagnosis patient was having symptoms of fatigue and hair loss.  Also had noticed a swelling in her neck and was found to have a goiter She does not think she had symptoms of, cold sensitivity, difficulty concentrating, dry skin, weight gain  She had been treated by various physicians over the last several years Around 2014 she thinks she was continuing to have fatigue  and when seen by a new endocrinologist she was suggested trying levothyroxine and liothyronine separately However she is not clear whether she started feeling better with this, she may have felt a little less tired        The patient has been treated with either levothyroxine or a combination of levothyroxine and Cytomel  RECENT history: On her initial consultation in 8/19 she was complaining of fatigue She has had chronic cold intolerance  The dose of levothyroxine was progressively reduced after the visit in 8/21  Also she did not have coverage for brand-name Synthroid use previously Current dose of levothyroxine is 125 mcg  With levothyroxine she also takes generic LIOTHYRONINE 5 mcg daily  She has been feeling fairly good recently with her energy level Has mild chronic cold intolerance Weight is significantly lower compared to last year and she thinks this is mostly related to changing her diet and increased activity  She has been consistent with taking her levothyroxine and liothyronine before eating or having her coffee which she does 30 minutes later  Does not take any calcium or iron containing vitamins at the same time; takes her Protonix at bedtime  Her TSH is again consistently normal  Free T4 somewhat variable          Patient's weight history is as follows:  Wt Readings from Last 3 Encounters:  11/29/22 215 lb 6.4 oz (97.7 kg)  06/27/22 219 lb (99.3 kg)  03/17/22 231 lb 3.2 oz (104.9 kg)    Thyroid function results have been as follows:   Lab Results  Component Value Date   TSH 0.99 11/24/2022   TSH 2.12 06/27/2022   TSH 2.32 11/28/2021   TSH 1.32 07/25/2021   FREET4 1.38 11/24/2022   FREET4 1.03 06/27/2022   FREET4 1.20 11/28/2021   FREET4 1.34 07/25/2021   T3FREE 3.4 06/08/2020   T3FREE 3.3 07/07/2019   T3FREE 3.1 05/13/2019   Related to problem discussed today: See review of systems  Past Medical History:  Diagnosis Date   Allergic rhinitis 12/26/2014   Aortic aneurysm ([facility]) 2018   Bicuspid aortic valve 12/14/2020   Endometriosis 1990's   Hypothyroid    Migraines     Past Surgical History:  Procedure Laterality Date   APPENDECTOMY     CHOLECYSTECTOMY     OTHER SURGICAL HISTORY     surgery for Endometriosis   TEE WITHOUT CARDIOVERSION N/A 03/22/2017   Procedure: TRANSESOPHAGEAL ECHOCARDIOGRAM (TEE);  Surgeon: Yates Decamp, MD;  Location: [facility] ENDOSCOPY;  Service: Cardiovascular;  Laterality: N/A;    Family History  Problem Relation Age of Onset   Diabetes Mother    Healthy Father    Thyroid disease Maternal Aunt    Multiple sclerosis Maternal Grandmother  Heart disease Maternal Grandfather    Cancer Paternal Grandmother        melanoma   Heart disease Paternal Grandfather    Thyroid disease Cousin     Social History:  reports that she has never smoked. She has never used smokeless tobacco. She reports current alcohol use. She reports that she does not use drugs.  Allergies:  Allergies  Allergen Reactions   Vicodin [Hydrocodone-Acetaminophen] Nausea And Vomiting    Allergies as of 11/29/2022       Reactions   Vicodin [hydrocodone-acetaminophen] Nausea And Vomiting        Medication List        Accurate as of November 29, 2022  8:59 AM. If you have any  questions, ask your nurse or doctor.          azithromycin 250 MG tablet Commonly known as: ZITHROMAX Take 1 tablet (250 mg total) by mouth daily. Take first 2 tablets together, then 1 every day until finished.   Fish Oil 1000 MG Caps Take by mouth.   levothyroxine 125 MCG tablet Commonly known as: SYNTHROID TAKE 1 TABLET DAILY   liothyronine 5 MCG tablet Commonly known as: CYTOMEL TAKE 1 TABLET DAILY   losartan 100 MG tablet Commonly known as: COZAAR Take 1 tablet (100 mg total) by mouth daily.   pantoprazole 40 MG tablet Commonly known as: PROTONIX Take 1 tablet (40 mg total) by mouth daily.   Vitamin B Complex Tabs Orally   vitamin B-12 100 MCG tablet Commonly known as: CYANOCOBALAMIN Take 100 mcg by mouth daily.   vitamin C 1000 MG tablet SMARTSIG:1 By Mouth   VITAMIN D-3 PO Take 2,000 Units by mouth.   Cholecalciferol 50 MCG (2000 UT) Caps 1 capsule Orally Once a day for 30 day(s)        Review of Systems   She has had chronic reflux and takes her Protonix at bedtime      Vitamin B12 deficiency: She previously had stopped B12 supplement since she thought she started having itching after starting this and vitamin D She is taking only B complex vitamins Her B12 level last was back to normal levels  Lab Results  Component Value Date   VITAMINB12 574 06/27/2022     VITAMIN D deficiency: She had been given 5000 units vitamin D3 which she now takes in the evenings with food  Vitamin D level is consistently normal   Lab Results  Component Value Date   VD25OH 49.36 06/27/2022   VD25OH 57.61 11/28/2021   VD25OH 54.38 07/25/2021   VD25OH 36.92 11/09/2020   Mild OSTEOPENIA: T score was -1.1 when checked in 2021 this is despite likely having premature menopause in her 40s  Followed by PCP for her hypertension, taking losartan 100 mg  BP Readings from Last 3 Encounters:  11/29/22 110/70  07/20/22 125/79  07/08/22 139/79            Examination:    BP 110/70 (BP Location: Left Arm, Patient Position: Sitting, Cuff Size: Normal)   Pulse (!) 55   Ht 5' 7.75" (1.721 m)   Wt 215 lb 6.4 oz (97.7 kg)   LMP 10/03/2008   SpO2 98%   BMI 32.99 kg/m     Assessment:  HYPOTHYROIDISM, primary, 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  She has been on a combination of levothyroxine 125 mcg and 5 mcg of liothyronine  She is taking her supplements consistently every morning  No change in the labs even with  her weight loss since last year  He is subjectively doing well  PLAN:   She will continue with the doses of levothyroxine and liothyronine unchanged   Follow-up in 6 months   There are no Patient Instructions on file for this visit.   Reather Littler 11/29/2022, 8:59 AM     Note: This office note was prepared with Dragon voice recognition system technology. Any transcriptional errors that result from this process are unintentional.    Reather Littler

## 2022-12-25 ENCOUNTER — Other Ambulatory Visit: Payer: Self-pay

## 2022-12-25 MED ORDER — LOSARTAN POTASSIUM 100 MG PO TABS: 100.0000 mg | ORAL_TABLET | Freq: Every day | ORAL | 3 refills | Status: DC

## 2022-12-26 ENCOUNTER — Ambulatory Visit: Payer: Managed Care, Other (non HMO) | Admitting: Internal Medicine

## 2022-12-29 ENCOUNTER — Ambulatory Visit: Payer: Managed Care, Other (non HMO) | Admitting: Internal Medicine

## 2023-01-04 ENCOUNTER — Ambulatory Visit: Payer: Managed Care, Other (non HMO) | Admitting: Internal Medicine

## 2023-01-11 ENCOUNTER — Ambulatory Visit: Payer: BC Managed Care – PPO | Admitting: Internal Medicine

## 2023-01-16 ENCOUNTER — Telehealth: Payer: Self-pay | Admitting: Internal Medicine

## 2023-01-16 NOTE — Telephone Encounter (Signed)
Patient requests TOC from Dr. Jonny Ruiz to Dr. Mardelle Matte.  Please advise

## 2023-01-16 NOTE — Telephone Encounter (Signed)
Ok with me 

## 2023-01-17 ENCOUNTER — Ambulatory Visit: Payer: BC Managed Care – PPO | Admitting: Internal Medicine

## 2023-01-17 NOTE — Telephone Encounter (Signed)
TOC appointment scheduled for 07/24/23

## 2023-02-06 ENCOUNTER — Ambulatory Visit
Admission: EM | Admit: 2023-02-06 | Discharge: 2023-02-06 | Disposition: A | Discharge status: Home / Self Care | Payer: BC Managed Care – PPO | Attending: Emergency Medicine | Admitting: Emergency Medicine

## 2023-02-06 ENCOUNTER — Ambulatory Visit: Payer: BC Managed Care – PPO

## 2023-02-06 ENCOUNTER — Ambulatory Visit: Payer: Managed Care, Other (non HMO) | Admitting: Nurse Practitioner

## 2023-02-06 DIAGNOSIS — U071 COVID-19: Secondary | ICD-10-CM | POA: Diagnosis not present

## 2023-02-06 DIAGNOSIS — J069 Acute upper respiratory infection, unspecified: Secondary | ICD-10-CM | POA: Diagnosis not present

## 2023-02-06 MED ORDER — BENZONATATE 100 MG PO CAPS: 100.0000 mg | ORAL_CAPSULE | Freq: Three times a day (TID) | ORAL | 0 refills | Status: DC | PRN

## 2023-02-06 NOTE — Discharge Instructions (Addendum)
Your COVID test is pending.    Take the [facility] as directed for cough.  Take Tylenol as needed for fever or discomfort.  Rest and keep yourself hydrated.    Follow-up with your primary care provider if your symptoms are not improving.

## 2023-02-06 NOTE — ED Provider Notes (Signed)
Renaldo Fiddler    CSN: [id number] Arrival date & time: 02/06/23  1839      History   Chief Complaint Chief Complaint  Patient presents with   Nasal Congestion   Sore Throat   Generalized Body Aches    HPI Olivia Mendez is a 57 y.o. female.  Patient presents with 2-day history of bodyaches, headache, congestion, sore throat, cough.  Treating with OTC cough and allergy medications.  No fever, shortness of breath, or other symptoms.  Her medical history includes allergies, aneurysm, migraine headaches, hypothyroidism, GERD, diverticulosis.  The history is provided by the patient and medical records.    Past Medical History:  Diagnosis Date   Allergic rhinitis 12/26/2014   Aortic aneurysm ([facility]) 2018   Bicuspid aortic valve 12/14/2020   Endometriosis 1990's   Hypothyroid    Migraines     Patient Active Problem List   Diagnosis Date Noted   Personal history of colonic polyps 06/27/2022   Skin nodule 06/27/2022   Vitamin D deficiency 07/25/2021   B12 deficiency 07/25/2021   Bicuspid aortic valve 12/14/2020   Rash 02/21/2020   Allergic rhinitis 04/09/2019   Viral illness 12/16/2018   Encounter for well adult exam with abnormal findings 02/21/2018   Thoracic ascending aortic aneurysm ([facility]) 03/18/2017   Pain in left finger(s) 02/21/2017   Finger pain, left 02/07/2017   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/05/2015   Acute upper respiratory infection 05/05/2015   Lower leg edema 11/13/2014   Fatigue 11/13/2014   Tick bite 11/13/2014   HELICOBACTER PYLORI INFECTION 02/25/2009   ESOPHAGEAL STRICTURE 02/25/2009   HIATAL HERNIA 02/25/2009   DIVERTICULOSIS, COLON 02/25/2009   GERD 12/01/2008   Anxiety state 11/26/2008   CHEST PAIN 11/26/2008   Hypothyroidism 09/14/2008   HLD (hyperlipidemia) 09/14/2008   BACK PAIN 09/14/2008    Past Surgical History:  Procedure Laterality Date   APPENDECTOMY     CHOLECYSTECTOMY     OTHER SURGICAL HISTORY     surgery for Endometriosis    TEE WITHOUT CARDIOVERSION N/A 03/22/2017   Procedure: TRANSESOPHAGEAL ECHOCARDIOGRAM (TEE);  Surgeon: Yates Decamp, MD;  Location: [facility] ENDOSCOPY;  Service: Cardiovascular;  Laterality: N/A;    OB History     Gravida  0   Para  0   Term  0   Preterm  0   AB  0   Living  0      SAB  0   IAB  0   Ectopic  0   Multiple  0   Live Births  0            Home Medications    Prior to Admission medications   Medication Sig Start Date End Date Taking? Authorizing Provider  benzonatate (TESSALON) 100 MG capsule Take 1 capsule (100 mg total) by mouth 3 (three) times daily as needed for cough. 02/06/23  Yes Mickie Bail, NP  Ascorbic Acid (VITAMIN C) 1000 MG tablet SMARTSIG:1 By Mouth 06/08/21   [provider]  azithromycin (ZITHROMAX) 250 MG tablet Take 1 tablet (250 mg total) by mouth daily. Take first 2 tablets together, then 1 every day until finished. 07/08/22   Valinda Hoar, NP  B Complex Vitamins (VITAMIN B COMPLEX) TABS Orally    [provider]  Cholecalciferol (VITAMIN D-3 PO) Take 2,000 Units by mouth.    [provider]  Cholecalciferol 50 MCG (2000 UT) CAPS 1 capsule Orally Once a day for 30 day(s)  [provider]  levothyroxine (SYNTHROID) 125 MCG tablet Take 1 tablet (125 mcg total) by mouth daily. 11/29/22   Reather Littler, MD  liothyronine (CYTOMEL) 5 MCG tablet Take 1 tablet (5 mcg total) by mouth daily. 11/29/22   Reather Littler, MD  losartan (COZAAR) 100 MG tablet Take 1 tablet (100 mg total) by mouth daily. 12/25/22   Yates Decamp, MD  Omega-3 Fatty Acids (FISH OIL) 1000 MG CAPS Take by mouth.    [provider]  pantoprazole (PROTONIX) 40 MG tablet Take 1 tablet (40 mg total) by mouth daily. 07/25/21   Corwin Levins, MD  vitamin B-12 (CYANOCOBALAMIN) 100 MCG tablet Take 100 mcg by mouth daily.    [provider]    Family History Family History  Problem Relation Age of Onset   Diabetes Mother    Healthy Father     Thyroid disease Maternal Aunt    Multiple sclerosis Maternal Grandmother    Heart disease Maternal Grandfather    Cancer Paternal Grandmother        melanoma   Heart disease Paternal Grandfather    Thyroid disease Cousin     Social History Social History   Tobacco Use   Smoking status: Never   Smokeless tobacco: Never  Vaping Use   Vaping status: Never Used  Substance Use Topics   Alcohol use: Yes    Comment: Rare   Drug use: No     Allergies   Vicodin [hydrocodone-acetaminophen]   Review of Systems Review of Systems  Constitutional:  Negative for chills and fever.  HENT:  Positive for congestion and sore throat. Negative for ear pain.   Respiratory:  Positive for cough. Negative for shortness of breath.   Gastrointestinal:  Negative for diarrhea and vomiting.  Neurological:  Positive for headaches.     Physical Exam Triage Vital Signs ED Triage Vitals  Encounter Vitals Group     BP 02/06/23 1937 127/80     Systolic BP Percentile --      Diastolic BP Percentile --      Pulse Rate 02/06/23 1923 76     Resp 02/06/23 1923 18     Temp 02/06/23 1923 98.3 F (36.8 C)     Temp src --      SpO2 02/06/23 1923 99 %     Weight --      Height --      Head Circumference --      Peak Flow --      Pain Score 02/06/23 1929 3     Pain Loc --      Pain Education --      Exclude from Growth Chart --    No data found.  Updated Vital Signs BP 127/80   Pulse 76   Temp 98.3 F (36.8 C)   Resp 18   LMP 10/03/2008   SpO2 99%   Visual Acuity Right Eye Distance:   Left Eye Distance:   Bilateral Distance:    Right Eye Near:   Left Eye Near:    Bilateral Near:     Physical Exam Vitals and nursing note reviewed.  Constitutional:      General: She is not in acute distress.    Appearance: She is well-developed.  HENT:     Right Ear: Tympanic membrane normal.     Left Ear: Tympanic membrane normal.     Nose: Nose normal.     Mouth/Throat:     Mouth: Mucous  membranes  are moist.     Pharynx: Oropharynx is clear.  Cardiovascular:     Rate and Rhythm: Normal rate and regular rhythm.     Heart sounds: Normal heart sounds.  Pulmonary:     Effort: Pulmonary effort is normal. No respiratory distress.     Breath sounds: Normal breath sounds.  Musculoskeletal:     Cervical back: Neck supple.  Skin:    General: Skin is warm and dry.  Neurological:     Mental Status: She is alert.      UC Treatments / Results  Labs (all labs ordered are listed, but only abnormal results are displayed) Labs Reviewed  SARS CORONAVIRUS 2 (TAT 6-24 HRS)    EKG   Radiology No results found.  Procedures Procedures (including critical care time)  Medications Ordered in UC Medications - No data to display  Initial Impression / Assessment and Plan / UC Course  I have reviewed the triage vital signs and the nursing notes.  Pertinent labs & imaging results that were available during my care of the patient were reviewed by me and considered in my medical decision making (see chart for details).    Viral URI.  COVID pending.  If COVID test is positive, patient declines antiviral treatment.  Treating cough with Tessalon Perles.  Discussed symptomatic treatment including Tylenol, rest, hydration.  Instructed patient to follow up with PCP if symptoms are not improving.  She agrees to plan of care.   Final Clinical Impressions(s) / UC Diagnoses   Final diagnoses:  Viral URI     Discharge Instructions      Your COVID test is pending.    Take the [facility] as directed for cough.  Take Tylenol as needed for fever or discomfort.  Rest and keep yourself hydrated.    Follow-up with your primary care provider if your symptoms are not improving.         ED Prescriptions     Medication Sig Dispense Auth. Provider   benzonatate (TESSALON) 100 MG capsule Take 1 capsule (100 mg total) by mouth 3 (three) times daily as needed for cough. 21 capsule  Mickie Bail, NP      PDMP not reviewed this encounter.   Mickie Bail, NP 02/06/23 [phone]

## 2023-02-06 NOTE — ED Triage Notes (Signed)
Patient to Urgent Care with complaints of nasal congestion, sore throat, generalized body aches, headaches, cough.  Reports symptoms started two days ago. Denies any known fevers.   Takes daily otc allergy medication/ cough meds/ sudafed.

## 2023-02-07 LAB — SARS CORONAVIRUS 2 (TAT 6-24 HRS): SARS Coronavirus 2: POSITIVE — AB

## 2023-03-12 ENCOUNTER — Other Ambulatory Visit: Payer: Managed Care, Other (non HMO)

## 2023-03-14 ENCOUNTER — Ambulatory Visit (HOSPITAL_COMMUNITY): Payer: BC Managed Care – PPO | Attending: Cardiovascular Disease

## 2023-03-14 ENCOUNTER — Other Ambulatory Visit (HOSPITAL_COMMUNITY): Payer: Self-pay | Admitting: Cardiology

## 2023-03-14 DIAGNOSIS — Q2381 Bicuspid aortic valve: Secondary | ICD-10-CM | POA: Insufficient documentation

## 2023-03-14 DIAGNOSIS — I7121 Aneurysm of the ascending aorta, without rupture: Secondary | ICD-10-CM | POA: Diagnosis not present

## 2023-03-14 DIAGNOSIS — Q263 Partial anomalous pulmonary venous connection: Secondary | ICD-10-CM

## 2023-03-14 LAB — ECHOCARDIOGRAM COMPLETE
AR max vel: 1.5 cm2
AV Area VTI: 1.36 cm2
AV Area mean vel: 1.38 cm2
AV Mean grad: 11 mm[Hg]
AV Peak grad: 20.2 mm[Hg]
Ao pk vel: 2.25 m/s
Area-P 1/2: 3.42 cm2
S' Lateral: 2.8 cm

## 2023-03-14 NOTE — Progress Notes (Signed)
Echocardiogram 03/14/2023: 1. Left ventricular ejection fraction, by estimation, is 60 to 65%. Left ventricular ejection fraction by 3D volume is 59 %. The left ventricle has normal function. The left ventricle has no regional wall motion abnormalities. There is mild left ventricular hypertrophy. Left ventricular diastolic parameters were normal. The average left ventricular global longitudinal strain is -20.0 %. The global longitudinal strain is normal.  2. Right ventricular systolic function is normal. The right ventricular size is mildly enlarged. There is normal pulmonary artery systolic pressure. The estimated right ventricular systolic pressure is 23.2 mmHg.  3. The mitral valve is normal in structure. Trivial mitral valve regurgitation. No evidence of mitral stenosis.  4. The aortic valve was not well visualized. Unable to determine aortic valve morphology due to image quality. Aortic valve regurgitation is trivial. Mild aortic valve stenosis. Aortic valve area, by VTI measures 1.36 cm. Aortic valve mean gradient  measures 11.0 mmHg. Aortic valve Vmax measures 2.24 m/s.  5. Aortic dilatation noted. Aneurysm of the ascending aorta, measuring 46 mm. There is moderate dilatation of the ascending aorta.  6. The inferior vena cava is dilated in size with >50% respiratory variability, suggesting right atrial pressure of 8 mmHg. Compared to echocardiogram 12/10/2020, CT angiogram 11/22/2021, ascending aortic aneurysm has increased from 42 mm.

## 2023-03-14 NOTE — Progress Notes (Signed)
ICD-10-CM   1. Aneurysm of ascending aorta without rupture (HCC)  I71.21 CT ANGIO CHEST AORTA W/CM & OR WO/CM    Basic metabolic panel    2. Bicuspid aortic valve  Q23.81 CT ANGIO CHEST AORTA W/CM & OR WO/CM    3. Partial anomalous pulmonary venous return (PAPVR)  Q26.3 CT ANGIO CHEST AORTA W/CM & OR WO/CM     Orders Placed This Encounter  Procedures   CT ANGIO CHEST AORTA W/CM & OR WO/CM    Standing Status:   Future    Standing Expiration Date:   03/13/2024    Order Specific Question:   If indicated for the ordered procedure, I authorize the administration of contrast media per Radiology protocol    Answer:   Yes    Order Specific Question:   Preferred imaging location?    Answer:   [id number]    Order Specific Question:   Is patient pregnant?    Answer:   No   Basic metabolic panel    Standing Status:   Future    Standing Expiration Date:   03/13/2024

## 2023-03-19 ENCOUNTER — Ambulatory Visit
Admission: RE | Admit: 2023-03-19 | Discharge: 2023-03-19 | Disposition: A | Discharge status: Home / Self Care | Payer: BC Managed Care – PPO | Source: Ambulatory Visit | Attending: Cardiology | Admitting: Cardiology

## 2023-03-19 DIAGNOSIS — R911 Solitary pulmonary nodule: Secondary | ICD-10-CM | POA: Diagnosis not present

## 2023-03-19 DIAGNOSIS — I7121 Aneurysm of the ascending aorta, without rupture: Secondary | ICD-10-CM

## 2023-03-19 DIAGNOSIS — Q263 Partial anomalous pulmonary venous connection: Secondary | ICD-10-CM

## 2023-03-19 DIAGNOSIS — Q2381 Bicuspid aortic valve: Secondary | ICD-10-CM

## 2023-03-19 MED ORDER — IOPAMIDOL (ISOVUE-370) INJECTION 76%
75.0000 mL | Freq: Once | INTRAVENOUS | Status: AC | PRN
  Administered 2023-03-19: 75 mL via INTRAVENOUS

## 2023-03-22 ENCOUNTER — Ambulatory Visit: Payer: BC Managed Care – PPO | Attending: Cardiology | Admitting: Cardiology

## 2023-03-22 ENCOUNTER — Encounter: Payer: Self-pay | Admitting: Cardiology

## 2023-03-22 VITALS — BP 132/78 | HR 78 | Resp 16 | Ht 67.0 in | Wt 208.9 lb

## 2023-03-22 DIAGNOSIS — I7121 Aneurysm of the ascending aorta, without rupture: Secondary | ICD-10-CM | POA: Diagnosis not present

## 2023-03-22 DIAGNOSIS — Q2381 Bicuspid aortic valve: Secondary | ICD-10-CM | POA: Diagnosis not present

## 2023-03-22 NOTE — Progress Notes (Signed)
Cardiology Office Note:  .   Date:  03/22/2023  ID:  Olivia Mendez, DOB [birth year], MRN [id number] PCP: Willow Ora, MD  [facility] Providers Cardiologist:  Yates Decamp, MD    History of Present Illness: Olivia Mendez   Olivia Mendez is a 57 y.o. 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   Discussed the use of AI scribe software for clinical note transcription with the patient, who gave verbal consent to proceed.  History of Present Illness   The patient, with a history of bicuspid aortic valve and ascending aortic aneurysm, has recently changed jobs and is now on their feet more often. She reports that this change in activity level has been beneficial. She had COVID twice in the past year, in February and August, despite having received all vaccines and boosters.   The patient has been wearing compression socks, but stopped about a month ago. The patient also reports lifting heavy objects at work, up to 50 pounds, but ensures she do not hold their breath while lifting. She have been experiencing some stress at work due to increased social interaction, which is outside of her comfort zone. The patient has been losing weight and is pleased with their progress.   Review of Systems  Cardiovascular:  Negative for chest pain, dyspnea on exertion and leg swelling.    Risk Assessment/Calculations:     Lab Results  Component Value Date   CHOL 211 (H) 06/27/2022   HDL 72.50 06/27/2022   LDLCALC 122 (H) 06/27/2022   TRIG 84.0 06/27/2022   CHOLHDL 3 06/27/2022   Lab Results  Component Value Date   NA 138 06/27/2022   K 4.2 06/27/2022   CO2 29 06/27/2022   GLUCOSE 92 06/27/2022   BUN 15  06/27/2022   CREATININE 0.82 06/27/2022   CALCIUM 10.5 06/27/2022   GFR 79.11 06/27/2022   GFRNONAA >60 11/18/2008   Lab Results  Component Value Date   WBC 7.6 06/27/2022   HGB 13.9 06/27/2022   HCT 41.6 06/27/2022   MCV 95.0 06/27/2022   PLT 314.0 06/27/2022    Physical Exam:   VS:  BP 132/78 (BP Location: Left Arm, Patient Position: Sitting, Cuff Size: Large)   Pulse 78   Resp 16   Ht 5\' 7"  (1.702 m)   Wt 208 lb 14.4 oz (94.8 kg)   LMP 10/03/2008   SpO2 97%   BMI 32.72 kg/m    Wt Readings from Last 3 Encounters:  03/22/23 208 lb 14.4 oz (94.8 kg)  11/29/22 215 lb 6.4 oz (97.7 kg)  06/27/22 219 lb (99.3 kg)     Physical Exam Constitutional:      Appearance: She is obese.  Neck:     Vascular: No carotid bruit or JVD.  Cardiovascular:     Rate and Rhythm: Normal rate and regular rhythm.     Pulses: Normal pulses and intact distal pulses.     Heart sounds: S1 normal and S2 normal. Murmur heard.     Early systolic murmur is present with a grade of 2/6 at the upper right sternal border.     No gallop.  Pulmonary:     Effort: Pulmonary effort is normal. No accessory muscle  usage.     Breath sounds: Normal breath sounds.  Abdominal:     General: Bowel sounds are normal.     Palpations: Abdomen is soft.  Musculoskeletal:     Right lower leg: No edema.     Left lower leg: No edema.     Studies Reviewed: Olivia Mendez    EKG:    EKG Interpretation Date/Time:  Thursday March 22 2023 09:41:25 EDT Ventricular Rate:  59 PR Interval:  144 QRS Duration:  94 QT Interval:  402 QTC Calculation: 397 R Axis:   9  Text Interpretation: EKG 03/22/2023: Normal sinus rhythm at rate of 59 bpm, normal axis, incomplete right bundle branch block.  Normal EKG.  No significant change from prior EKG Confirmed by Delrae Rend [phone]) on 03/22/2023 10:14:03 AM    EKG 03/17/2022: Normal sinus rhythm with rate of 57 bpm, incomplete right bundle branch block. Normal EKG.   Echocardiogram  03/14/2023:   1. Left ventricular ejection fraction, by estimation, is 60 to 65%. Left ventricular ejection fraction by 3D volume is 59 %. The left ventricle has normal function. The left ventricle has no regional wall motion abnormalities. There is mild left ventricular hypertrophy. Left ventricular diastolic parameters were normal. The average left ventricular global longitudinal strain is -20.0 %. The global longitudinal strain is normal. 2. Right ventricular systolic function is normal. The right ventricular size is mildly enlarged. There is normal pulmonary artery systolic pressure. The estimated right ventricular systolic pressure is 23.2 mmHg. 3. The mitral valve is normal in structure. Trivial mitral valve regurgitation. No evidence of mitral stenosis. 4. The aortic valve was not well visualized. Unable to determine aortic valve morphology due to image quality. Aortic valve regurgitation is trivial. Mild aortic valve stenosis. Aortic valve area, by VTI measures 1.36 cm. Aortic valve mean gradient measures 11.0 mmHg. Aortic valve Vmax measures 2.24 m/s. 5. Aortic dilatation noted. Aneurysm of the ascending aorta, measuring 46 mm. There is moderate dilatation of the ascending aorta. 6. The inferior vena cava is dilated in size with >50% respiratory variability, suggesting right atrial pressure of 8 mmHg. Compared to 11/28/2021, no significant change.   ASSESSMENT AND PLAN: .      ICD-10-CM   1. Aneurysm of ascending aorta without rupture (HCC)  I71.21 EKG 12-Lead    2. Bicuspid aortic valve  Q23.81 EKG 12-Lead      Assessment and Plan  1. Aneurysm of ascending aorta without rupture (HCC) Patient's ascending aortic aneurysm was slightly increased in size from 4.4-4 0.5-4.6, CT angiogram of the chest has been performed but not read by radiology and I will follow-up on this.  Presently on losartan 100 mg daily and she is tolerating this.  She is also losing weight, encouraged to continue to  be active.  Advised her not to lift heavy objects greater than 15 to 20 pounds. - EKG 12-Lead  2. Bicuspid aortic valve Patient has bicuspid aortic valve noted by prior TEE, hence we will be aggressive with monitoring of her ascending aortic aneurysm.  I will review the CT report and also compared them to prior CT and echocardiogram, if measurements remain stable or can be conservatively managed, I will see her back in a year however if there has been a significant change we will discuss further options with the patient.  All questions were answered.  No changes in the medications today. - EKG 12-Lead  Signed,  Yates Decamp, MD, [facility] 03/22/2023, 9:33 PM [facility] [address] [place], [place] [address] Phone: [phone]. Fax:  [phone]

## 2023-03-22 NOTE — Patient Instructions (Addendum)
Medication Instructions:  Your physician recommends that you continue on your current medications as directed. Please refer to the Current Medication list given to you today.  *If you need a refill on your cardiac medications before your next appointment, please call your pharmacy*  Lab Work: None ordered today.  Testing/Procedures: None ordered today.  Follow-Up: At [facility], you and your health needs are our priority.  As part of our continuing mission to provide you with exceptional heart care, we have created designated Provider Care Teams.  These Care Teams include your primary Cardiologist (physician) and Advanced Practice Providers (APPs -  Physician Assistants and Nurse Practitioners) who all work together to provide you with the care you need, when you need it.  Your next appointment:   1 year(s)  The format for your next appointment:   In Person  Provider:   Yates Decamp, MD

## 2023-03-25 NOTE — Progress Notes (Signed)
CT angio chest 03/25/2023: 1. Stable uncomplicated fusiform aneurysmal dilatation of the ascending thoracic aorta measuring 44 mm in diameter, unchanged compared to the 02/2017 examination by my direct remeasurement.  2. Aortic aneurysm NOS (ICD10-I71.9). 3. Interval development of a 0.8 cm subpleural nodular opacity within the anteromedial aspect of the left upper lobe. Non-contrast chest CT at 6-12 months is recommended. If the nodule is stable at time of repeat CT, then future CT at 18-24 months (from today's scan) is considered optional for low-risk patients, but is recommended for high-risk patients. This recommendation follows the consensus statement: Guidelines for Management of Incidental Pulmonary Nodules Detected on CT Images: From the Fleischner Society 2017; Radiology 2017; 284:228-243. 4. Suspected hepatic steatosis.  Correlation with LFTs is advised.  Will consider repeat CT in 1 year because of lung nodule

## 2023-05-08 ENCOUNTER — Other Ambulatory Visit: Payer: Self-pay

## 2023-05-08 DIAGNOSIS — E063 Autoimmune thyroiditis: Secondary | ICD-10-CM

## 2023-05-28 ENCOUNTER — Other Ambulatory Visit: Payer: BC Managed Care – PPO

## 2023-06-04 ENCOUNTER — Ambulatory Visit: Payer: BC Managed Care – PPO | Admitting: Endocrinology

## 2023-06-07 ENCOUNTER — Other Ambulatory Visit: Payer: BC Managed Care – PPO

## 2023-06-07 DIAGNOSIS — E063 Autoimmune thyroiditis: Secondary | ICD-10-CM | POA: Diagnosis not present

## 2023-06-08 LAB — T4, FREE: Free T4: 1.5 ng/dL (ref 0.8–1.8)

## 2023-06-08 LAB — TSH: TSH: 0.82 m[IU]/L (ref 0.40–4.50)

## 2023-06-14 ENCOUNTER — Ambulatory Visit: Payer: BC Managed Care – PPO | Admitting: Endocrinology

## 2023-06-14 DIAGNOSIS — D220 Melanocytic nevi of lip: Secondary | ICD-10-CM | POA: Diagnosis not present

## 2023-06-14 DIAGNOSIS — L814 Other melanin hyperpigmentation: Secondary | ICD-10-CM | POA: Diagnosis not present

## 2023-06-14 DIAGNOSIS — L821 Other seborrheic keratosis: Secondary | ICD-10-CM | POA: Diagnosis not present

## 2023-06-14 DIAGNOSIS — D225 Melanocytic nevi of trunk: Secondary | ICD-10-CM | POA: Diagnosis not present

## 2023-07-19 ENCOUNTER — Ambulatory Visit: Payer: BC Managed Care – PPO | Admitting: Endocrinology

## 2023-07-24 ENCOUNTER — Encounter: Payer: BC Managed Care – PPO | Admitting: Family Medicine

## 2023-08-01 ENCOUNTER — Ambulatory Visit: Payer: Self-pay | Admitting: Family Medicine

## 2023-08-01 NOTE — Telephone Encounter (Signed)
  Chief Complaint: jaw pain Symptoms: earache, yawning, clinching  Frequency: comes and goes  Disposition: [] ED /[] Urgent Care (no appt availability in office) / [] Appointment(In office/virtual)/ []  [facility]/ [x] Home Care/ [] Refused Recommended Disposition /[] [facility]/ []  Follow-up with PCP Additional Notes: Pt calling to see if new pt appt can be moved up from 3/26 due to concerns of jaw pain worsening. This pain started in Nov/Dec 2024. Pt had crowns put on in January and notified  dentist. Dentist told pt this was muscular and to seek care with PCP. Pt has pain when she clinches, yawn, or opens mouth wide. The pain is creating a deep earache. RN called [facility] and has appt moved up to 3/18. RN notified pt to seek care at an Urgent Care if symptoms worsen or pain intensifies before appt. RN gave care advice and pt verbalized understanding.           Reason for Disposition  Nursing judgment or information in reference  Answer Assessment - Initial Assessment Questions 1. REASON FOR CALL: "What is your main concern right now?"     Jaw pain  2. ONSET: "When did the pain start?"     Nov/Dec 2024 3. SEVERITY: "How bad is the pain ?"     Pain is less during day 5-7;  4. FEVER: "Do you have a fever?"     Denies  5. OTHER SYMPTOMS: "Do you have any other new symptoms?"     Earache  6. TREATMENTS AND RESPONSE: "What have you done so far to try to make this better? What medicines have you used?"     Takes nothing for pain  Protocols used: No Guideline Available-A-AH

## 2023-08-01 NOTE — Telephone Encounter (Signed)
 Noted.

## 2023-08-21 ENCOUNTER — Encounter: Payer: Self-pay | Admitting: Family Medicine

## 2023-08-21 ENCOUNTER — Ambulatory Visit: Payer: BC Managed Care – PPO | Admitting: Family Medicine

## 2023-08-21 VITALS — BP 123/82 | HR 58 | Temp 97.7°F | Ht 67.0 in | Wt 200.2 lb

## 2023-08-21 DIAGNOSIS — E039 Hypothyroidism, unspecified: Secondary | ICD-10-CM | POA: Diagnosis not present

## 2023-08-21 DIAGNOSIS — E782 Mixed hyperlipidemia: Secondary | ICD-10-CM

## 2023-08-21 DIAGNOSIS — K760 Fatty (change of) liver, not elsewhere classified: Secondary | ICD-10-CM | POA: Insufficient documentation

## 2023-08-21 DIAGNOSIS — K219 Gastro-esophageal reflux disease without esophagitis: Secondary | ICD-10-CM

## 2023-08-21 DIAGNOSIS — M26621 Arthralgia of right temporomandibular joint: Secondary | ICD-10-CM | POA: Diagnosis not present

## 2023-08-21 DIAGNOSIS — I7121 Aneurysm of the ascending aorta, without rupture: Secondary | ICD-10-CM | POA: Diagnosis not present

## 2023-08-21 DIAGNOSIS — Z23 Encounter for immunization: Secondary | ICD-10-CM

## 2023-08-21 DIAGNOSIS — Q2381 Bicuspid aortic valve: Secondary | ICD-10-CM

## 2023-08-21 DIAGNOSIS — E538 Deficiency of other specified B group vitamins: Secondary | ICD-10-CM

## 2023-08-21 DIAGNOSIS — Z0001 Encounter for general adult medical examination with abnormal findings: Secondary | ICD-10-CM | POA: Diagnosis not present

## 2023-08-21 DIAGNOSIS — E559 Vitamin D deficiency, unspecified: Secondary | ICD-10-CM | POA: Diagnosis not present

## 2023-08-21 DIAGNOSIS — Z1231 Encounter for screening mammogram for malignant neoplasm of breast: Secondary | ICD-10-CM

## 2023-08-21 LAB — CBC WITH DIFFERENTIAL/PLATELET
Basophils Absolute: 0 10*3/uL (ref 0.0–0.1)
Basophils Relative: 0.4 % (ref 0.0–3.0)
Eosinophils Absolute: 0.2 10*3/uL (ref 0.0–0.7)
Eosinophils Relative: 4.3 % (ref 0.0–5.0)
HCT: 41.6 % (ref 36.0–46.0)
Hemoglobin: 13.9 g/dL (ref 12.0–15.0)
Lymphocytes Relative: 39.5 % (ref 12.0–46.0)
Lymphs Abs: 2 10*3/uL (ref 0.7–4.0)
MCHC: 33.5 g/dL (ref 30.0–36.0)
MCV: 95.3 fl (ref 78.0–100.0)
Monocytes Absolute: 0.3 10*3/uL (ref 0.1–1.0)
Monocytes Relative: 6.4 % (ref 3.0–12.0)
Neutro Abs: 2.5 10*3/uL (ref 1.4–7.7)
Neutrophils Relative %: 49.4 % (ref 43.0–77.0)
Platelets: 302 10*3/uL (ref 150.0–400.0)
RBC: 4.36 Mil/uL (ref 3.87–5.11)
RDW: 13 % (ref 11.5–15.5)
WBC: 5.1 10*3/uL (ref 4.0–10.5)

## 2023-08-21 LAB — COMPREHENSIVE METABOLIC PANEL
ALT: 15 U/L (ref 0–35)
AST: 13 U/L (ref 0–37)
Albumin: 4.4 g/dL (ref 3.5–5.2)
Alkaline Phosphatase: 71 U/L (ref 39–117)
BUN: 10 mg/dL (ref 6–23)
CO2: 26 meq/L (ref 19–32)
Calcium: 10.4 mg/dL (ref 8.4–10.5)
Chloride: 104 meq/L (ref 96–112)
Creatinine, Ser: 0.66 mg/dL (ref 0.40–1.20)
GFR: 96.23 mL/min (ref >60.00)
Glucose, Bld: 93 mg/dL (ref 70–99)
Potassium: 4.2 meq/L (ref 3.5–5.1)
Sodium: 138 meq/L (ref 135–145)
Total Bilirubin: 1 mg/dL (ref 0.2–1.2)
Total Protein: 6.7 g/dL (ref 6.0–8.3)

## 2023-08-21 LAB — LIPID PANEL
Cholesterol: 205 mg/dL — ABNORMAL HIGH (ref 0–200)
HDL: 74.2 mg/dL (ref >39.00)
LDL Cholesterol: 111 mg/dL — ABNORMAL HIGH (ref 0–99)
NonHDL: 131.19
Total CHOL/HDL Ratio: 3
Triglycerides: 100 mg/dL (ref 0.0–149.0)
VLDL: 20 mg/dL (ref 0.0–40.0)

## 2023-08-21 LAB — HEMOGLOBIN A1C: Hgb A1c MFr Bld: 5.8 % (ref 4.6–6.5)

## 2023-08-21 LAB — VITAMIN D 25 HYDROXY (VIT D DEFICIENCY, FRACTURES): VITD: 44.25 ng/mL (ref 30.00–100.00)

## 2023-08-21 LAB — VITAMIN B12: Vitamin B-12: 748 pg/mL (ref 211–911)

## 2023-08-21 MED ORDER — CYCLOBENZAPRINE HCL 10 MG PO TABS: 10.0000 mg | ORAL_TABLET | Freq: Every evening | ORAL | 2 refills | Status: DC | PRN

## 2023-08-21 NOTE — Progress Notes (Signed)
 Subjective  Chief Complaint  Patient presents with   Transfer of care    Pt stated that she has been having Rt side jaw pain that radiate to her ear. Pt needs order for Mammogram    HPI: Olivia Mendez is a 58 y.o. female who presents to [facility] [place] [facility] today for a Female Wellness Visit. She also has the concerns and/or needs as listed above in the chief complaint. These will be addressed in addition to the Health Maintenance Visit.  I reviewed multiple records from endocrinology, prior PCP, gastroenterology, cardiology, laboratory reports, echocardiogram results, lab results. Wellness Visit: annual visit with health maintenance review and exam  Health maintenance: She sees gynecology.  Overdue for mammogram.  Pap smear is current.  Dr. Dulce Sellar is her gastroenterologist.  Colonoscopy is current and up-to-date.  Does have history of benign polyps.  Now on every 10 year screenings.  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  She is liking the change.  A bit out of her comfort zone but doing well.  More active.  Eligible for Prevnar and influenza vaccines today.  Other immunizations up-to-date.  Eye exam current.  Physically feels well.  Of note, her mother passed in 2020, she was a former patient of mine.  There is happily married. Chronic disease f/u and/or acute problem visit: (deemed necessary to be done in addition to the wellness visit): Thoracic ascending aorta aneurysm without rupture and bicuspid aortic valve monitored by Dr. Jacinto Halim, cardiology.  Fortunately stable.  Has regular follow-up.  I reviewed echocardiogram results.  Feels well.  Does have some lifting weight restrictions.  She tries to comply.  She takes losartan for this reason.  Tolerates well.  No history of hypertension. Hypothyroidism: Managed by endocrinology.  Recent testing in January shows control is good.  She is transferring to a new endocrinologist because hers retired.  Feels well.  Compliant  with medications. Hyperlipidemia and history of hepatic steatosis: Not currently on statin medication.  Nonfasting for recheck today. GERD is controlled on Protonix daily.  Has history of esophageal stricture s/p dilatation.  No symptoms now. Vitamin B12 deficiency on oral supplements. Vitamin D deficiency on oral supplements. Complains of right sided ear pain, actually pointing to the joint.  Pain with certain chewing and sometimes can hear noise in the joint with pain with movement.  Does not believe she is a grinder or clench her.  No trauma.  No hearing loss.  Assessment  1. Encounter for well adult exam with abnormal findings   2. Need for influenza vaccination   3. Acquired hypothyroidism   4. Mixed hyperlipidemia   5. Aneurysm of ascending aorta without rupture (HCC)   6. Bicuspid aortic valve   7. Gastroesophageal reflux disease, unspecified whether esophagitis present   8. B12 deficiency   9. Vitamin D deficiency   10. Screening mammogram for breast cancer   11. Hepatic steatosis   12. Arthralgia of right temporomandibular joint   13. Need for pneumococcal 20-valent conjugate vaccination      Plan  Female Wellness Visit: Age appropriate Health Maintenance and Prevention measures were discussed with patient. Included topics are cancer screening recommendations, ways to keep healthy (see AVS) including dietary and exercise recommendations, regular eye and dental care, use of seat belts, and avoidance of moderate alcohol use and tobacco use.  Mammogram ordered.  Patient to schedule.  Other screens current BMI: discussed patient's BMI and encouraged positive lifestyle modifications to  help get to or maintain a target BMI. HM needs and immunizations were addressed and ordered. See below for orders. See HM and immunization section for updates.  Flu shot and Prevnar 20 given today Routine labs and screening tests ordered including cmp, cbc and lipids where appropriate. Discussed  recommendations regarding Vit D and calcium supplementation (see AVS)  Chronic disease management visit and/or acute problem visit: Hypothyroidism is well-controlled. Bicuspid aortic valve and thoracic aneurysm: Stable and monitored by cardiology.  Will follow along.  Continue losartan 100 mg daily.  Monitor electrolytes. Screen for lipid disorders today with nonfasting labs. Recheck vitamin B12 and D levels today.  On oral supplements. TMJ: Counseling education given.  Recommend Advil or Tylenol as needed.  Monitor for clenching.  Can consider bite guard if needed.  Add Flexeril at night if needed. GERD on chronic PPI.  Stable.  Follow up: 1 year for complete physical Orders Placed This Encounter  Procedures   MM DIGITAL SCREENING BILATERAL   Flu vaccine trivalent PF, 6mos and older(Flulaval,Afluria,Fluarix,Fluzone)   Pneumococcal conjugate vaccine 20-valent (Prevnar 20)   VITAMIN D 25 Hydroxy (Vit-D Deficiency, Fractures)   CBC with Differential/Platelet   Comprehensive metabolic panel   Lipid panel   Vitamin B12   Hemoglobin A1c   Meds ordered this encounter  Medications   cyclobenzaprine (FLEXERIL) 10 MG tablet    Sig: Take 1 tablet (10 mg total) by mouth at bedtime as needed (TMJ pain).    Dispense:  30 tablet    Refill:  2      Body mass index is 31.36 kg/m. Wt Readings from Last 3 Encounters:  08/21/23 200 lb 3.2 oz (90.8 kg)  03/22/23 208 lb 14.4 oz (94.8 kg)  11/29/22 215 lb 6.4 oz (97.7 kg)     Patient Active Problem List   Diagnosis Date Noted Date Diagnosed   Thoracic ascending aortic aneurysm (HCC) 03/18/2017     Priority: High    CT scan 02/09/17: Ascending thoracic aorta prominence of maximum measurer transverse diameter 4.44.4 cm.  No thoracic aortic dissection.  Recommend annual imaging.  Mild atelectasis on the left lung.  Hepatic steatosis    Hypothyroidism 09/14/2008     Priority: High   Mixed hyperlipidemia 09/14/2008     Priority: High   Hepatic  steatosis 08/21/2023     Priority: Medium    History of colonic polyps 06/27/2022     Priority: Medium    Bicuspid aortic valve 12/14/2020     Priority: Medium    Lower leg edema 11/13/2014     Priority: Medium    Stricture and stenosis of esophagus 02/25/2009     Priority: Medium    Diaphragmatic hernia 02/25/2009     Priority: Medium    Diverticulosis of colon 02/25/2009     Priority: Medium    GERD 12/01/2008     Priority: Medium    Vitamin D deficiency 07/25/2021     Priority: Low   B12 deficiency 07/25/2021     Priority: Low   Allergic rhinitis 04/09/2019     Priority: Low   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/05/2015     Priority: Low   Health Maintenance  Topic Date Due   MAMMOGRAM  01/21/2022   COVID-19 Vaccine (6 - 2024-25 season) 09/06/2023 (Originally 02/04/2023)   DTaP/Tdap/Td (3 - Td or Tdap) 12/06/2026   Cervical Cancer Screening (HPV/Pap Cotest)  02/02/2027   Colonoscopy  08/15/2027   Pneumococcal Vaccine 76-59 Years old  Completed  INFLUENZA VACCINE  Completed   Hepatitis C Screening  Completed   HIV Screening  Completed   Zoster Vaccines- Shingrix  Completed   HPV VACCINES  Aged Out   Immunization History  Administered Date(s) Administered   Influenza, Seasonal, Injecte, Preservative Fre 08/21/2023   Influenza,inj,Quad PF,6+ Mos 02/20/2020   Influenza,inj,quad, With Preservative 03/14/2022   Influenza-Unspecified 03/20/2019, 04/11/2021   PFIZER(Purple Top)SARS-COV-2 Vaccination 08/18/2019, 09/08/2019, 04/11/2020, 10/16/2020, 02/26/2021   PNEUMOCOCCAL CONJUGATE-20 08/21/2023   Td 09/14/2008   Tdap 12/05/2016   Zoster Recombinant(Shingrix) 07/13/2020, 09/20/2020   We updated and reviewed the patient's past history in detail and it is documented below. Allergies: Patient is allergic to vicodin [hydrocodone-acetaminophen]. Past Medical History Patient  has a past medical history of Allergic rhinitis (12/26/2014), Aortic aneurysm (HCC) (2018), Bicuspid aortic  valve (12/14/2020), Endometriosis (1990's), Hypothyroid, and Migraines. Past Surgical History Patient  has a past surgical history that includes Appendectomy; Cholecystectomy; Other surgical history; and TEE without cardioversion (N/A, 03/22/2017). Family History: Patient family history includes Cancer in her paternal grandmother; Diabetes in her mother; Healthy in her father; Heart disease in her maternal grandfather and paternal grandfather; Multiple sclerosis in her maternal grandmother; Thyroid disease in her cousin and maternal aunt. Social History:  Patient  reports that she has never smoked. She has never used smokeless tobacco. She reports current alcohol use. She reports that she does not use drugs.  Review of Systems: Constitutional: negative for fever or malaise Ophthalmic: negative for photophobia, double vision or loss of vision Cardiovascular: negative for chest pain, dyspnea on exertion, or new LE swelling Respiratory: negative for SOB or persistent cough Gastrointestinal: negative for abdominal pain, change in bowel habits or melena Genitourinary: negative for dysuria or gross hematuria, no abnormal uterine bleeding or disharge Musculoskeletal: negative for new gait disturbance or muscular weakness Integumentary: negative for new or persistent rashes, no breast lumps Neurological: negative for TIA or stroke symptoms Psychiatric: negative for SI or delusions Allergic/Immunologic: negative for hives  Patient Care Team    Relationship Specialty Notifications Start End  Willow Ora, MD PCP - General Family Medicine  08/21/23   Yates Decamp, MD PCP - Cardiology Cardiology  03/22/23   Yates Decamp, MD Consulting Physician Cardiology  07/24/23   Th[place], [place], MD Consulting Physician Endocrinology  07/24/23   Olivia Mackie, NP Nurse Practitioner Gynecology  07/24/23   Willis Modena, MD Consulting Physician Gastroenterology  08/21/23     Objective  Vitals: BP 123/82   Pulse  (!) 58   Temp 97.7 F (36.5 C)   Ht 5\' 7"  (1.702 m)   Wt 200 lb 3.2 oz (90.8 kg)   LMP 10/03/2008   SpO2 98%   BMI 31.36 kg/m  General:  Well developed, well nourished, no acute distress  Psych:  Alert and orientedx3,normal mood and affect HEENT:  Normocephalic, atraumatic, non-icteric sclera,  supple neck without adenopathy, mass or thyromegaly, right TMJ tender with crepitus and some mild subluxation. Cardiovascular:  Normal S1, S2, RRR without gallop, rub or murmur Respiratory:  Good breath sounds bilaterally, CTAB with normal respiratory effort Gastrointestinal: normal bowel sounds, soft, non-tender, no noted masses. No HSM MSK: extremities without edema, joints without erythema or swelling Neurologic:    Mental status is normal.  Gross motor and sensory exams are normal.  No tremor  Commons side effects, risks, benefits, and alternatives for medications and treatment plan prescribed today were discussed, and the patient expressed understanding of the given instructions. Patient is instructed to call  or message via MyChart if he/she has any questions or concerns regarding our treatment plan. No barriers to understanding were identified. We discussed Red Flag symptoms and signs in detail. Patient expressed understanding regarding what to do in case of urgent or emergency type symptoms.  Medication list was reconciled, printed and provided to the patient in AVS. Patient instructions and summary information was reviewed with the patient as documented in the AVS. This note was prepared with assistance of Dragon voice recognition software. Occasional wrong-word or sound-a-like substitutions may have occurred due to the inherent limitations of voice recognition software

## 2023-08-21 NOTE — Patient Instructions (Signed)
Please return in 12 months for your annual complete physical; please come fasting.   I will release your lab results to you on your MyChart account with further instructions. You may see the results before I do, but when I review them I will send you a message with my report or have my assistant call you if things need to be discussed. Please reply to my message with any questions. Thank you!   If you have any questions or concerns, please don't hesitate to send me a message via MyChart or call the office at [phone]. Thank you for visiting with us today! It's our pleasure caring for you.   Please do these things to maintain good health!  Exercise at least 30-45 minutes a day,  4-5 days a week.  Eat a low-fat diet with lots of fruits and vegetables, up to 7-9 servings per day. Drink plenty of water daily. Try to drink 8 8oz glasses per day. Seatbelts can save your life. Always wear your seatbelt. Place Smoke Detectors on every level of your home and check batteries every year. Schedule an appointment with an eye doctor for an eye exam every 1-2 years Safe sex - use condoms to protect yourself from STDs if you could be exposed to these types of infections. Use birth control if you do not want to become pregnant and are sexually active. Avoid heavy alcohol use. If you drink, keep it to less than 2 drinks/day and not every day. Health Care Power of Attorney.  Choose someone you trust that could speak for you if you became unable to speak for yourself. Depression is common in our stressful world.If you're feeling down or losing interest in things you normally enjoy, please come in for a visit. If anyone is threatening or hurting you, please get help. Physical or Emotional Violence is never OK.   

## 2023-08-29 ENCOUNTER — Encounter: Payer: BC Managed Care – PPO | Admitting: Family Medicine

## 2023-08-29 ENCOUNTER — Encounter: Payer: Self-pay | Admitting: Family Medicine

## 2023-08-29 NOTE — Progress Notes (Signed)
 Labs reviewed.  The 10-year ASCVD risk score (Arnett DK, et al., 2019) is: 2.3%   Values used to calculate the score:     Age: 58 years     Sex: Female     Is Non-Hispanic African American: No     Diabetic: No     Tobacco smoker: No     Systolic Blood Pressure: 123 mmHg     Is BP treated: No     HDL Cholesterol: 74.2 mg/dL     Total Cholesterol: 205 mg/dL

## 2023-09-03 ENCOUNTER — Encounter: Payer: Self-pay | Admitting: Endocrinology

## 2023-09-03 ENCOUNTER — Ambulatory Visit
Admission: RE | Admit: 2023-09-03 | Discharge: 2023-09-03 | Disposition: A | Discharge status: Home / Self Care | Source: Ambulatory Visit | Attending: Family Medicine | Admitting: Family Medicine

## 2023-09-03 ENCOUNTER — Ambulatory Visit: Payer: BC Managed Care – PPO | Admitting: Endocrinology

## 2023-09-03 VITALS — BP 120/80 | HR 59 | Ht 67.0 in | Wt 207.8 lb

## 2023-09-03 DIAGNOSIS — E063 Autoimmune thyroiditis: Secondary | ICD-10-CM | POA: Diagnosis not present

## 2023-09-03 DIAGNOSIS — Z1231 Encounter for screening mammogram for malignant neoplasm of breast: Secondary | ICD-10-CM | POA: Diagnosis not present

## 2023-09-03 MED ORDER — LEVOTHYROXINE SODIUM 125 MCG PO TABS: 125.0000 ug | ORAL_TABLET | Freq: Every day | ORAL | 3 refills | Status: AC

## 2023-09-03 MED ORDER — LIOTHYRONINE SODIUM 5 MCG PO TABS: 5.0000 ug | ORAL_TABLET | Freq: Every day | ORAL | 3 refills | Status: AC

## 2023-09-03 NOTE — Progress Notes (Signed)
 Outpatient Endocrinology Note Olivia Kameren Baade, MD   Patient's Name: Olivia Mendez    DOB: 03-Jul-1965    MRN: [id number]  REASON OF VISIT: Follow-up for hypothyroidism  PCP: Willow Ora, MD  HISTORY OF PRESENT ILLNESS:   Olivia Mendez is a 58 y.o. old female with past medical history as listed below is presented for a follow up for primary hypothyroidism.   Pertinent Thyroid History: Patient was previously seen by Dr. Lucianne Muss and was last time seen in June 2024.  Patient was diagnosed with primary hypothyroidism in 1994.  She has symptoms of fatigue and hair loss at that time.  She also found swelling of the neck and was found to have goiter.  She has seen various physician over the past several years for hypothyroid management.  Around 2014 she continued to have fatigue and a new endocrinologist started her on combination therapy with liothyronine and levothyroxine, which helped her with little less tiredness.  She had ultrasound thyroid in July 2011 heterogenous thyroid tissue without focal nodules.  She has been taking levothyroxine 125 mcg daily and liothyronine/Cytomel 5 mcg daily.  She did not have coverage for brand-name Synthroid.  # She has osteopenia, DEXA scan in 2021, managed by primary care provider.  She had premature menopause at the age of 71s.   Interval history She has been taking levothyroxine 125 mcg daily and Cytomel 5 Mg daily, she taking the morning and empty stomach.  She reports compliance.  Denies palpitation or heat intolerance.  No change in bowel habit.  Overall feeling fair energy.  No other complaints today. She had thyroid function test in January with normal TSH and free T4 as follows.   Latest Reference Range & Units 06/07/23 15:41  TSH 0.40 - 4.50 mIU/L 0.82  T4,Free(Direct) 0.8 - 1.8 ng/dL 1.5    REVIEW OF SYSTEMS:  As per history of present illness.   PAST MEDICAL HISTORY: Past Medical History:  Diagnosis Date   Allergic rhinitis 12/26/2014    Aortic aneurysm (HCC) 2018   Bicuspid aortic valve 12/14/2020   Endometriosis 1990's   Hypothyroid    Migraines     PAST SURGICAL HISTORY: Past Surgical History:  Procedure Laterality Date   APPENDECTOMY     CHOLECYSTECTOMY     OTHER SURGICAL HISTORY     surgery for Endometriosis   TEE WITHOUT CARDIOVERSION N/A 03/22/2017   Procedure: TRANSESOPHAGEAL ECHOCARDIOGRAM (TEE);  Surgeon: Yates Decamp, MD;  Location: [facility] ENDOSCOPY;  Service: Cardiovascular;  Laterality: N/A;    ALLERGIES: Allergies  Allergen Reactions   Vicodin [Hydrocodone-Acetaminophen] Nausea And Vomiting    FAMILY HISTORY:  Family History  Problem Relation Age of Onset   Diabetes Mother    Healthy Father    Thyroid disease Maternal Aunt    Multiple sclerosis Maternal Grandmother    Heart disease Maternal Grandfather    Cancer Paternal Grandmother        melanoma   Heart disease Paternal Grandfather    Thyroid disease Cousin     SOCIAL HISTORY: Social History   Socioeconomic History   Marital status: Married    Spouse name: Not on file   Number of children: 0   Years of education: 16   Highest education level: Not on file  Occupational History   Occupation: Warden/ranger  Tobacco Use   Smoking status: Never   Smokeless tobacco: Never  Vaping Use   Vaping status: Never Used  Substance and Sexual Activity   Alcohol  use: Yes    Comment: Rare   Drug use: No   Sexual activity: Yes    Partners: Male    Birth control/protection: Other-see comments    Comment: husband vasectomy  Other Topics Concern   Not on file  Social History Narrative   Fun: Garden, read   Denies religious beliefs effecting health care.    Social Drivers of Corporate investment banker Strain: Not on file  Food Insecurity: Not on file  Transportation Needs: Not on file  Physical Activity: Not on file  Stress: Not on file  Social Connections: Unknown (10/17/2021)   Received from [facility]   Social  Network    Social Network: Not on file    MEDICATIONS:  Current Outpatient Medications  Medication Sig Dispense Refill   Cholecalciferol (VITAMIN D-3 PO) Take 2,000 Units by mouth.     cyclobenzaprine (FLEXERIL) 10 MG tablet Take 1 tablet (10 mg total) by mouth at bedtime as needed (TMJ pain). 30 tablet 2   losartan (COZAAR) 100 MG tablet Take 1 tablet (100 mg total) by mouth daily. 90 tablet 3   pantoprazole (PROTONIX) 40 MG tablet Take 1 tablet (40 mg total) by mouth daily. 90 tablet 3   vitamin B-12 (CYANOCOBALAMIN) 100 MCG tablet Take 100 mcg by mouth daily.     levothyroxine (SYNTHROID) 125 MCG tablet Take 1 tablet (125 mcg total) by mouth daily. 90 tablet 3   liothyronine (CYTOMEL) 5 MCG tablet Take 1 tablet (5 mcg total) by mouth daily. 90 tablet 3   No current facility-administered medications for this visit.    PHYSICAL EXAM: Vitals:   09/03/23 1559  BP: 120/80  Pulse: (!) 59  SpO2: 98%  Weight: 207 lb 12.8 oz (94.3 kg)  Height: 5\' 7"  (1.702 m)   Body mass index is 32.55 kg/m.  Wt Readings from Last 3 Encounters:  09/03/23 207 lb 12.8 oz (94.3 kg)  08/21/23 200 lb 3.2 oz (90.8 kg)  03/22/23 208 lb 14.4 oz (94.8 kg)    General: Well developed, well nourished female in no apparent distress.  HEENT: AT/[place], no external lesions. Hearing intact to the spoken word Eyes: EOMI. No stare, proptosis or lid lag. Conjunctiva clear and no icterus. Neck: Trachea midline, neck supple Lungs: Clear to auscultation, no wheeze. Respirations not labored Heart: S1S2, Regular in rate and rhythm. No loud murmurs Abdomen: Soft, non tender, non distended Neurologic: Alert, oriented, normal speech, deep tendon biceps reflexes normal,  no gross focal neurological deficit Extremities: No pedal pitting edema, no tremors of outstretched hands Skin: Warm, color good.  Psychiatric: Does not appear depressed or anxious  PERTINENT HISTORIC LABORATORY AND IMAGING STUDIES:  All pertinent  laboratory results were reviewed. Please see HPI also for further details.   TSH  Date Value Ref Range Status  06/07/2023 0.82 0.40 - 4.50 mIU/L Final  11/24/2022 0.99 0.35 - 5.50 uIU/mL Final  06/27/2022 2.12 0.35 - 5.50 uIU/mL Final     ASSESSMENT / PLAN  1. Acquired autoimmune hypothyroidism    -Patient has longstanding history of primary hypothyroidism, diagnosed in 1994.  She has been on combination of levothyroxine 125 mcg and liothyronine 5 mcg daily.  She is euthyroid clinically today.  She had normal thyroid function test in July 2025.  Plan: -No Labs today. -Continue current dose of levothyroxine 125 mcg daily and liothyronine 5 mcg daily. -Annual endocrinology follow-up. -Recheck thyroid function test prior to follow-up visit in 1 year, TSH, free  T4 and we will also check free T3 as she has been on liothyronine. -Asked to call our clinic with any questions in between the visits, discussed about hypo and hyperthyroid symptoms. -Discussed that especially liothyronine can increase bone loss and potential for causing arrhythmia.  Will continue combination therapy for now.    Diagnoses and all orders for this visit:  Acquired autoimmune hypothyroidism -     liothyronine (CYTOMEL) 5 MCG tablet; Take 1 tablet (5 mcg total) by mouth daily. -     levothyroxine (SYNTHROID) 125 MCG tablet; Take 1 tablet (125 mcg total) by mouth daily. -     T4, free -     TSH -     T3, free    DISPOSITION Follow up in clinic in 12 months suggested.  Labs prior to follow-up visit as ordered.  All questions answered and patient verbalized understanding of the plan.  Olivia Jaia Alonge, MD [facility] Endocrinology [facility] [address] [place], [address] [place], [place] [address] Phone # [phone]  At least part of this note was generated using voice recognition software. Inadvertent word errors may have occurred, which were not recognized during the proofreading process.

## 2023-10-13 ENCOUNTER — Ambulatory Visit
Admission: EM | Admit: 2023-10-13 | Discharge: 2023-10-13 | Disposition: A | Discharge status: Home / Self Care | Attending: Emergency Medicine | Admitting: Emergency Medicine

## 2023-10-13 DIAGNOSIS — M25561 Pain in right knee: Secondary | ICD-10-CM | POA: Diagnosis not present

## 2023-10-13 MED ORDER — KETOROLAC TROMETHAMINE 30 MG/ML IJ SOLN
30.0000 mg | Freq: Once | INTRAMUSCULAR | Status: AC
  Administered 2023-10-13: 30 mg via INTRAMUSCULAR

## 2023-10-13 MED ORDER — DICLOFENAC SODIUM 75 MG PO TBEC: 75.0000 mg | DELAYED_RELEASE_TABLET | Freq: Two times a day (BID) | ORAL | 0 refills | Status: AC

## 2023-10-13 NOTE — ED Provider Notes (Signed)
 Olivia Mendez    CSN: [id number] Arrival date & time: 10/13/23  1142      History   Chief Complaint No chief complaint on file.   HPI Olivia Mendez is a 58 y.o. female.   Patient presents for evaluation of right knee pain beginning 4 days ago without precipitating event, injury or trauma.  Endorses that she was in the car for at least an hour and a when attempting to get out pain started abruptly.  Endorses long periods of standing and walking due to her job.  Since symptoms began has been unable to bear weight to the lower extremity, endorses when straightening the leg it causes pain to radiate down into the calf muscle.  Denies numbness or tingling.  Has had to use crutches today due to to pain elicited when bearing weight, independent at baseline.  Has attempted use of a muscle relaxant and aspirin which has been ineffective.  Past Medical History:  Diagnosis Date   Allergic rhinitis 12/26/2014   Aortic aneurysm (HCC) 2018   Bicuspid aortic valve 12/14/2020   Endometriosis 1990's   Hypothyroid    Migraines     Patient Active Problem List   Diagnosis Date Noted   Hepatic steatosis 08/21/2023   History of colonic polyps 06/27/2022   Vitamin D  deficiency 07/25/2021   B12 deficiency 07/25/2021   Bicuspid aortic valve 12/14/2020   Allergic rhinitis 04/09/2019   Thoracic ascending aortic aneurysm (HCC) 03/18/2017   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/05/2015   Lower leg edema 11/13/2014   Stricture and stenosis of esophagus 02/25/2009   Diaphragmatic hernia 02/25/2009   Diverticulosis of colon 02/25/2009   GERD 12/01/2008   Hypothyroidism 09/14/2008   Mixed hyperlipidemia 09/14/2008    Past Surgical History:  Procedure Laterality Date   APPENDECTOMY     CHOLECYSTECTOMY     OTHER SURGICAL HISTORY     surgery for Endometriosis   TEE WITHOUT CARDIOVERSION N/A 03/22/2017   Procedure: TRANSESOPHAGEAL ECHOCARDIOGRAM (TEE);  Surgeon: Knox Perl, MD;  Location: [facility] ENDOSCOPY;   Service: Cardiovascular;  Laterality: N/A;    OB History     Gravida  0   Para  0   Term  0   Preterm  0   AB  0   Living  0      SAB  0   IAB  0   Ectopic  0   Multiple  0   Live Births  0            Home Medications    Prior to Admission medications   Medication Sig Start Date End Date Taking? Authorizing Provider  diclofenac  (VOLTAREN ) 75 MG EC tablet Take 1 tablet (75 mg total) by mouth 2 (two) times daily. 10/13/23 11/12/23 Yes Enma Maeda R, NP  Cholecalciferol (VITAMIN D -3 PO) Take 2,000 Units by mouth.    [provider]  cyclobenzaprine  (FLEXERIL ) 10 MG tablet Take 1 tablet (10 mg total) by mouth at bedtime as needed (TMJ pain). 08/21/23   Luevenia Saha, MD  levothyroxine  (SYNTHROID ) 125 MCG tablet Take 1 tablet (125 mcg total) by mouth daily. 09/03/23   Thapa, [place], MD  liothyronine  (CYTOMEL ) 5 MCG tablet Take 1 tablet (5 mcg total) by mouth daily. 09/03/23   Thapa, [place], MD  losartan  (COZAAR ) 100 MG tablet Take 1 tablet (100 mg total) by mouth daily. 12/25/22   Knox Perl, MD  pantoprazole  (PROTONIX ) 40 MG tablet Take 1 tablet (40 mg total) by  mouth daily. 07/25/21   Roslyn Coombe, MD  vitamin B-12 (CYANOCOBALAMIN ) 100 MCG tablet Take 100 mcg by mouth daily.    [provider]    Family History Family History  Problem Relation Age of Onset   Diabetes Mother    Healthy Father    Thyroid  disease Maternal Aunt    Multiple sclerosis Maternal Grandmother    Heart disease Maternal Grandfather    Cancer Paternal Grandmother        melanoma   Heart disease Paternal Grandfather    Thyroid  disease Cousin     Social History Social History   Tobacco Use   Smoking status: Never   Smokeless tobacco: Never  Vaping Use   Vaping status: Never Used  Substance Use Topics   Alcohol use: Yes    Comment: Rare   Drug use: No     Allergies   Vicodin [hydrocodone-acetaminophen]   Review of Systems Review of Systems   Physical  Exam Triage Vital Signs ED Triage Vitals  Encounter Vitals Group     BP      Systolic BP Percentile      Diastolic BP Percentile      Pulse      Resp      Temp      Temp src      SpO2      Weight      Height      Head Circumference      Peak Flow      Pain Score      Pain Loc      Pain Education      Exclude from Growth Chart    No data found.  Updated Vital Signs LMP 10/03/2008   Visual Acuity Right Eye Distance:   Left Eye Distance:   Bilateral Distance:    Right Eye Near:   Left Eye Near:    Bilateral Near:     Physical Exam Constitutional:      Appearance: Normal appearance.  Eyes:     Extraocular Movements: Extraocular movements intact.  Pulmonary:     Effort: Pulmonary effort is normal.  Musculoskeletal:     Comments: Generalized swelling to the right knee, point tenderness present to the posterior knee at the tendon, no cyst present, no effusion present, difficulty bearing weight and completing range of motion due to pain elicited, 2+ popliteal pulse  Neurological:     Mental Status: She is alert and oriented to person, place, and time. Mental status is at baseline.      UC Treatments / Results  Labs (all labs ordered are listed, but only abnormal results are displayed) Labs Reviewed - No data to display  EKG   Radiology No results found.  Procedures Procedures (including critical care time)  Medications Ordered in UC Medications  ketorolac (TORADOL) 30 MG/ML injection 30 mg (30 mg Intramuscular Given 10/13/23 1216)    Initial Impression / Assessment and Plan / UC Course  I have reviewed the triage vital signs and the nursing notes.  Pertinent labs & imaging results that were available during my care of the patient were reviewed by me and considered in my medical decision making (see chart for details).  Acute pain of right knee  X-ray imaging unavailable, discussed this with patient prior to initiating visit, etiology is most likely  irritation to the tendon or ligament, discussed, stable at this time, Toradol IM given and knee compression applied, patient able to walk independently from  clinic, prescribed diclofenac  and recommended supportive care through [facility] with activity as tolerated, given walker referral to orthopedics if no improvement seen Final Clinical Impressions(s) / UC Diagnoses   Final diagnoses:  Acute pain of right knee   Discharge Instructions      Your pain is most likely caused by irritation to the ligament or tendon  You have been given an injection of Toradol which helps reduce inflammation and pain and ideally will start to see improvement within the hour, stop use of ibuprofen, aspirin or similar products, may use Tylenol  Starting tomorrow take diclofenac  twice daily for 5 days to continue the above process, may take Tylenol or any topical medicines additionally  Compression sleeve has been applied for stability and support, may use when completing activity, may continue use of crutches as needed until able to fully put weight onto the leg  You may use heating pad in 15 minute intervals as needed for additional comfort  Begin stretching affected area daily for 10 minutes as tolerated to further loosen muscles   When lying down place pillow underneath and between knees for support  If pain persist after recommended treatment or reoccurs if may be beneficial to follow up with orthopedic specialist for evaluation, this doctor specializes in the bones and can manage your symptoms long-term with options such as but not limited to imaging, medications or physical therapy     ED Prescriptions     Medication Sig Dispense Auth. Provider   diclofenac  (VOLTAREN ) 75 MG EC tablet Take 1 tablet (75 mg total) by mouth 2 (two) times daily. 60 tablet Arda Keadle R, NP      PDMP not reviewed this encounter.   Reena Canning, NP 10/13/23 1245

## 2023-10-13 NOTE — Discharge Instructions (Signed)
 Your pain is most likely caused by irritation to the ligament or tendon  You have been given an injection of Toradol which helps reduce inflammation and pain and ideally will start to see improvement within the hour, stop use of ibuprofen, aspirin or similar products, may use Tylenol  Starting tomorrow take diclofenac  twice daily for 5 days to continue the above process, may take Tylenol or any topical medicines additionally  Compression sleeve has been applied for stability and support, may use when completing activity, may continue use of crutches as needed until able to fully put weight onto the leg  You may use heating pad in 15 minute intervals as needed for additional comfort  Begin stretching affected area daily for 10 minutes as tolerated to further loosen muscles   When lying down place pillow underneath and between knees for support  If pain persist after recommended treatment or reoccurs if may be beneficial to follow up with orthopedic specialist for evaluation, this doctor specializes in the bones and can manage your symptoms long-term with options such as but not limited to imaging, medications or physical therapy

## 2023-11-08 ENCOUNTER — Ambulatory Visit: Admitting: Family Medicine

## 2023-11-22 DIAGNOSIS — M25561 Pain in right knee: Secondary | ICD-10-CM | POA: Diagnosis not present

## 2023-12-27 ENCOUNTER — Ambulatory Visit (INDEPENDENT_AMBULATORY_CARE_PROVIDER_SITE_OTHER): Payer: Self-pay | Admitting: Nurse Practitioner

## 2023-12-27 ENCOUNTER — Encounter: Payer: Self-pay | Admitting: Nurse Practitioner

## 2023-12-27 ENCOUNTER — Other Ambulatory Visit: Payer: Self-pay | Admitting: Cardiology

## 2023-12-27 VITALS — BP 111/60 | HR 78 | Ht 66.0 in | Wt 193.0 lb

## 2023-12-27 DIAGNOSIS — Z780 Asymptomatic menopausal state: Secondary | ICD-10-CM

## 2023-12-27 DIAGNOSIS — Z01419 Encounter for gynecological examination (general) (routine) without abnormal findings: Secondary | ICD-10-CM

## 2023-12-27 DIAGNOSIS — M85851 Other specified disorders of bone density and structure, right thigh: Secondary | ICD-10-CM

## 2023-12-27 DIAGNOSIS — Z1331 Encounter for screening for depression: Secondary | ICD-10-CM | POA: Diagnosis not present

## 2023-12-27 NOTE — Patient Instructions (Signed)

## 2023-12-27 NOTE — Progress Notes (Signed)
 Olivia Mendez 08-27-1965 [id number]   History:  58 y.o. G0 presents for annual exam without GYN complaints. Postmenopausal - no HRT, no bleeding. Hypothyroidism managed by endocrinology.   Gynecologic History Patient's last menstrual period was 10/03/2008.   Contraception: post menopausal status Sexually active: Yes  Health maintenance Last Pap: 02/01/2022. Results were: Normal neg HPV Last mammogram: 09/03/2023. Results were: Normal Last colonoscopy: 08/14/2017. Results were: Normal, 10-year recall Last Dexa: 02/24/2020. Results were: T-score -1.1, FRAX 11% / 0.3%     12/27/2023   12:18 PM  Depression screen PHQ 2/9  Decreased Interest 0  Down, Depressed, Hopeless 0  PHQ - 2 Score 0     Past medical history, past surgical history, family history and social history were all reviewed and documented in the EPIC chart. Married. 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 Husband owns [employer] by [employer].   ROS:  A ROS was performed and pertinent positives and negatives are included.  Exam:  Vitals:   12/27/23 1218  BP: 111/60  Pulse: 78  SpO2: 100%  Weight: 193 lb (87.5 kg)  Height: 5' 6 (1.676 m)      Body mass index is 31.15 kg/m.  General appearance:  Normal Thyroid :  Symmetrical, normal in size, without palpable masses or nodularity. Respiratory  Auscultation:  Clear without wheezing or rhonchi Cardiovascular  Auscultation:  Regular rate, without rubs, murmurs or gallops  Edema/varicosities:  Not grossly evident Abdominal  Soft,nontender, without masses, guarding or rebound.  Liver/spleen:  No organomegaly noted  Hernia:  None appreciated  Skin  Inspection:  Grossly normal   Breasts: Examined lying and sitting.   Right: Without masses, retractions, discharge or axillary adenopathy.   Left: Without masses, retractions, discharge or axillary adenopathy. Pelvic: External genitalia:  no lesions              Urethra:  normal appearing urethra with no  masses, tenderness or lesions              Bartholins and Skenes: normal                 Vagina: normal appearing vagina with normal color and discharge, no lesions. Atrophic changes              Cervix: no lesions Bimanual Exam:  Uterus:  no masses or tenderness              Adnexa: no mass, fullness, tenderness              Rectovaginal: Deferred              Anus:  normal, no lesions  Dereck Keas, CMA present as chaperone.   Assessment/Plan:  58 y.o. G0 for annual exam.   Well female exam with routine gynecological exam - Education provided on SBEs, importance of preventative screenings, current guidelines, high calcium diet, regular exercise, and multivitamin daily. Labs with PCP and endocrinology.   Osteopenia of neck of right femur - Plan: DG Bone Density. T-score -1.04 February 2020. Continue Vitamin D  supplement, high-calcium diet and regular exercise. Will repeat DXA.   Postmenopausal - No HRT, no bleeding.   Screening for cervical cancer - Normal Pap history.  Will repeat at 5-year interval per guidelines.   Screening for breast cancer - Normal mammogram history. Continue annual screenings. Normal breast exam today .   Screening for colon cancer - 2019 colonoscopy. Will repeat at GI's recommended interval.   Return in about 1 year (  around 12/26/2024) for Annual.      Annabella DELENA [facility], 12:37 PM 12/27/2023

## 2024-01-04 ENCOUNTER — Ambulatory Visit: Payer: Self-pay

## 2024-01-04 NOTE — Telephone Encounter (Signed)
 Noted

## 2024-01-04 NOTE — Telephone Encounter (Signed)
 FYI Only or Action Required?: Appointment  Patient was last seen in primary care on 08/21/2023 by Jodie Lavern CROME, MD.  Called Nurse Triage reporting Jaw Pain.  Symptoms began several weeks ago.  Interventions attempted: Nothing.  Symptoms are: gradually worsening. Jaw pain with right ear pain. Hurts to eat and open mouth.  Triage Disposition: See Physician Within 24 Hours  Patient/caregiver understands and will follow disposition?: Yes   Copied from CRM #[id number]. Topic: Clinical - Red Word Triage >> Jan 04, 2024  9:36 AM Harlene ORN wrote: Red Word that prompted transfer to Nurse Triage: pain in her jaw getting wore in the past two weeks Reason for Disposition  [1] MODERATE pain (e.g., interferes with normal activities) AND [2] constant AND [3] present > 24 hours  Answer Assessment - Initial Assessment Questions 1. ONSET: When did the pain start? (e.g., minutes, hours, days)     2 weeks 2. ONSET: Does the pain come and go, or has it been constant since it started? (e.g., constant, intermittent, fleeting)     Comes and goes 3. SEVERITY: How bad is the pain? (Scale 1-10; mild, moderate or severe)     7 4. LOCATION: Where does it hurt?      Jaw, right ear pain 5. RASH: Is there any redness, rash, or swelling of your face?     no 6. FEVER: Do you have a fever? If Yes, ask: What is it, how was it measured, and when did it start?      no 7. OTHER SYMPTOMS: Do you have any other symptoms? (e.g., fever, toothache, nasal discharge, nasal congestion, clicking sensation in jaw joint)     no 8. PREGNANCY: Is there any chance you are pregnant? When was your last menstrual period?     no  Protocols used: Face Pain-A-AH

## 2024-01-07 ENCOUNTER — Ambulatory Visit: Admitting: Family Medicine

## 2024-01-07 ENCOUNTER — Encounter: Payer: Self-pay | Admitting: Family Medicine

## 2024-01-07 VITALS — BP 108/77 | HR 69 | Temp 97.9°F | Ht 66.0 in | Wt 192.2 lb

## 2024-01-07 DIAGNOSIS — M26621 Arthralgia of right temporomandibular joint: Secondary | ICD-10-CM | POA: Diagnosis not present

## 2024-01-07 MED ORDER — CYCLOBENZAPRINE HCL 10 MG PO TABS: 10.0000 mg | ORAL_TABLET | Freq: Every evening | ORAL | 1 refills | Status: AC | PRN

## 2024-01-07 MED ORDER — DICLOFENAC SODIUM 75 MG PO TBEC: 75.0000 mg | DELAYED_RELEASE_TABLET | Freq: Two times a day (BID) | ORAL | 1 refills | Status: DC

## 2024-01-07 NOTE — Patient Instructions (Signed)
 Please return in March 2026 for your annual complete physical; please come fasting.   If you have any questions or concerns, please don't hesitate to send me a message via MyChart or call the office at [phone]. Thank you for visiting with us  today! It's our pleasure caring for you.   Temporomandibular Joint Syndrome  Temporomandibular joint syndrome (TMJ syndrome) is a condition that causes pain in the temporomandibular joints. These joints are located near your ears and allow your jaw to open and close. For people with TMJ syndrome, chewing, biting, or other movements of the jaw can be difficult or painful. TMJ syndrome is often mild and goes away within a few weeks. However, sometimes the condition becomes a long-term (chronic) problem. What are the causes? This condition may be caused by: Grinding your teeth or clenching your jaw. Some people do this when they are stressed. Arthritis. An injury to the jaw. A head or neck injury. Teeth or dentures that are not aligned well. In some cases, the cause of TMJ syndrome may not be known. What are the signs or symptoms? The most common symptom of this condition is aching pain on the side of the head in the area of the TMJ. Other symptoms may include: Pain when moving your jaw, such as when chewing or biting. Not being able to open your jaw all the way. Making a clicking sound when you open your mouth. Headache. Earache. Neck or shoulder pain. How is this diagnosed? This condition may be diagnosed based on: Your symptoms and medical history. A physical exam. Your health care provider may check the range of motion of your jaw. Imaging tests, such as X-rays or an MRI. You may also need to see your dentist, who will check if your teeth and jaw are lined up correctly. How is this treated? TMJ syndrome often goes away on its own. If treatment is needed, it may include: Eating soft foods and applying ice or heat. Medicines to relieve pain  or inflammation. Medicines or massage to relax the muscles. A splint, bite plate, or mouthpiece to prevent teeth grinding or jaw clenching. Relaxation techniques or counseling to help reduce stress. A therapy for pain in which an electrical current is applied to the nerves through the skin (transcutaneous electrical nerve stimulation). Acupuncture. This may help to relieve pain. Jaw surgery. This is rarely needed. Follow these instructions at home:  Eating and drinking Eat a soft diet if you are having trouble chewing. Avoid foods that require a lot of chewing. Do not chew gum. General instructions Take over-the-counter and prescription medicines only as told by your health care provider. If directed, put ice on the painful area. To do this: Put ice in a plastic bag. Place a towel between your skin and the bag. Leave the ice on for 20 minutes, 2-3 times a day. Remove the ice if your skin turns bright red. This is very important. If you cannot feel pain, heat, or cold, you have a greater risk of damage to the area. Apply a warm, wet cloth (warm compress) to the painful area as told. Massage your jaw area and do any jaw stretching exercises as told by your health care provider. If you were given a splint, bite plate, or mouthpiece, wear it as told by your health care provider. Keep all follow-up visits. This is important. Where to find more information [employer]: [employer] Contact a health care provider if: You have trouble eating. You  have new or worsening symptoms. Get help right away if: Your jaw locks. Summary Temporomandibular joint syndrome (TMJ syndrome) is a condition that causes pain in the temporomandibular joints. These joints are located near your ears and allow your jaw to open and close. TMJ syndrome is often mild and goes away within a few weeks. However, sometimes the condition becomes a long-term (chronic)  problem. Symptoms include an aching pain on the side of the head in the area of the TMJ, pain when chewing or biting, and being unable to open your jaw all the way. You may also make a clicking sound when you open your mouth. TMJ syndrome often goes away on its own. If treatment is needed, it may include medicines to relieve pain, reduce inflammation, or relax the muscles. A splint, bite plate, or mouthpiece may also be used to prevent teeth grinding or jaw clenching. This information is not intended to replace advice given to you by your health care provider. Make sure you discuss any questions you have with your health care provider. Document Revised: 01/02/2021 Document Reviewed: 01/02/2021 Elsevier Patient Education  2024 [employer].

## 2024-01-07 NOTE — Progress Notes (Signed)
 Subjective  CC:  Chief Complaint  Patient presents with   Jaw Pain    Jaw pain for the past 4/5 months and it has gotten worse    HPI: Olivia Mendez is a 58 y.o. female who presents to the office today to address the problems listed above in the chief complaint. Discussed the use of AI scribe software for clinical note transcription with the patient, who gave verbal consent to proceed.  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  She describes worsening symptoms related to her temporomandibular joint (TMJ) disorder, including a sensation of swelling similar to post-dental procedure puffiness, though it is not numb. The discomfort is persistent, affecting her ability to open and close her mouth comfortably. She experiences a sensation of the jaw 'backing up' rather than closing properly, and notes a feeling of fullness in her ear, described as 'clogged up'.  Biting down hard results in a pulsating sensation in her ear. The pain extends to her throat and ear, with occasional sharp pains, but is primarily described as an ache in the general area of the jaw. She uses a muscle relaxer at night, which helps her sleep but does not alleviate symptoms the following day. She has not consulted her dentist recently but recalls a previous conversation where the dentist suggested the issue might be muscular.  For pain management, she occasionally takes Tylenol and has used diclofenac , which provided some relief. She is able to take anti-inflammatories like Advil and aspirin. She mentions a knee issue for which she was prescribed diclofenac , and notes that it helped alleviate her jaw symptoms when used.  She reports a sore throat and fullness in the ear on the affected side. She denies that the pain keeps her awake at night, although she uses muscle relaxers if the pain worsens in the evening. She uses the muscle relaxer  intermittently, sometimes for a couple of nights in a row, and then stops. She is considering using a mouth guard due to possible clenching, although she is unsure if she grinds her teeth. She has not been informed by her dentist about any signs of grinding.   Assessment  1. 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 Previous diclofenac  and muscle relaxers provided limited relief. Potential clenching or grinding noted. - Prescribed diclofenac  for two weeks, twice daily. - Refilled cyclobenzaprine  for nighttime use. - Recommended over-the-counter mouth guard for clenching. - Advised follow-up with dentist for panoramic x-ray of jaw joint. - Consider referral to oral surgeon if symptoms persist. - Discussed potential for steroid injection if arthritis confirmed.    Follow up: Olivia Mendez No orders of the defined types were placed in this encounter.  Meds ordered this encounter  Medications   diclofenac  (VOLTAREN ) 75 MG EC tablet    Sig: Take 1 tablet (75 mg total) by mouth 2 (two) times daily.    Dispense:  60 tablet    Refill:  1   cyclobenzaprine  (FLEXERIL ) 10 MG tablet    Sig: Take 1 tablet (10 mg total) by mouth at bedtime as needed (TMJ).    Dispense:  90 tablet    Refill:  1     I reviewed the patients updated PMH, FH, and SocHx.  Patient Active Problem List   Diagnosis Date Noted   Thoracic ascending aortic aneurysm (HCC) 03/18/2017    Priority: High   Hypothyroidism 09/14/2008    Priority: High   Mixed hyperlipidemia 09/14/2008    Priority: High   Hepatic steatosis 08/21/2023    Priority: Medium    History of colonic polyps 06/27/2022    Priority: Medium    Bicuspid aortic valve 12/14/2020    Priority: Medium    Lower leg edema 11/13/2014    Priority: Medium     Stricture and stenosis of esophagus 02/25/2009    Priority: Medium    Diaphragmatic hernia 02/25/2009    Priority: Medium    Diverticulosis of colon 02/25/2009    Priority: Medium    GERD 12/01/2008    Priority: Medium    Vitamin D  deficiency 07/25/2021    Priority: Low   B12 deficiency 07/25/2021    Priority: Low   Allergic rhinitis 04/09/2019    Priority: Low   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/05/2015    Priority: Low   Current Meds  Medication Sig   Cholecalciferol (VITAMIN D -3 PO) Take 2,000 Units by mouth.   Cyanocobalamin  (VITAMIN B12) 1000 MCG TBCR 1 day or 1 dose.   cyclobenzaprine  (FLEXERIL ) 10 MG tablet Take 1 tablet (10 mg total) by mouth at bedtime as needed (TMJ).   diclofenac  (VOLTAREN ) 75 MG EC tablet Take 1 tablet (75 mg total) by mouth 2 (two) times daily.   levothyroxine  (SYNTHROID ) 125 MCG tablet Take 1 tablet (125 mcg total) by mouth daily.   liothyronine  (CYTOMEL ) 5 MCG tablet Take 1 tablet (5 mcg total) by mouth daily.   losartan  (COZAAR ) 100 MG tablet TAKE 1 TABLET BY MOUTH EVERY DAY   pantoprazole  (PROTONIX ) 40 MG tablet Take 1 tablet (40 mg total) by mouth daily.   vitamin B-12 (CYANOCOBALAMIN ) 100 MCG tablet Take 100 mcg by mouth daily.   [DISCONTINUED] levothyroxine  (SYNTHROID ) 125 MCG tablet Take 125 mcg by mouth daily.   [DISCONTINUED] liothyronine  (CYTOMEL ) 5 MCG tablet Take 5 mcg by mouth daily.   [DISCONTINUED] losartan  (COZAAR ) 100 MG tablet Take 100 mg by mouth daily.   [DISCONTINUED] pantoprazole  (PROTONIX ) 40 MG tablet daily.   Allergies: Patient is allergic to vicodin [hydrocodone-acetaminophen]. Family History: Patient family history includes Cancer in her paternal grandmother; Diabetes in her mother; Healthy in her father; Heart disease in her maternal grandfather and paternal grandfather; Multiple sclerosis in her maternal grandmother; Thyroid  disease in her cousin and maternal aunt. Social History:  Patient  reports that she has never smoked. She  has never used smokeless tobacco. She reports current alcohol use. She reports that she does not use drugs.  Review of Systems: Constitutional: Negative for fever malaise or anorexia Cardiovascular: negative for chest pain Respiratory: negative for SOB or persistent cough Gastrointestinal: negative for abdominal pain  Objective  Vitals: BP 108/77   Pulse 69   Temp 97.9 F (36.6 C)   Ht 5' 6 (1.676 m)   Wt 192 lb 3.2 oz (87.2 kg)   LMP 10/03/2008   SpO2 99%   BMI 31.02 kg/m  General: no acute distress , A&Ox3 HEENT: PEERL, conjunctiva normal, neck is supple Right TMJ is tender, right tm is nl. Op is nl. No lad Commons side effects, risks, benefits, and alternatives for medications and treatment plan prescribed today were discussed, and the patient expressed understanding of the given instructions. Patient is instructed to call or message via MyChart if he/she has any questions  or concerns regarding our treatment plan. No barriers to understanding were identified. We discussed Red Flag symptoms and signs in detail. Patient expressed understanding regarding what to do in case of urgent or emergency type symptoms.  Medication list was reconciled, printed and provided to the patient in AVS. Patient instructions and summary information was reviewed with the patient as documented in the AVS. This note was prepared with assistance of Dragon voice recognition software. Occasional wrong-word or sound-a-like substitutions may have occurred due to the inherent limitations of voice recognition software

## 2024-01-31 ENCOUNTER — Ambulatory Visit
Admission: EM | Admit: 2024-01-31 | Discharge: 2024-01-31 | Disposition: A | Discharge status: Home / Self Care | Attending: Emergency Medicine | Admitting: Emergency Medicine

## 2024-01-31 ENCOUNTER — Encounter: Payer: Self-pay | Admitting: *Deleted

## 2024-01-31 DIAGNOSIS — J069 Acute upper respiratory infection, unspecified: Secondary | ICD-10-CM

## 2024-01-31 DIAGNOSIS — R051 Acute cough: Secondary | ICD-10-CM

## 2024-01-31 DIAGNOSIS — J029 Acute pharyngitis, unspecified: Secondary | ICD-10-CM | POA: Diagnosis not present

## 2024-01-31 LAB — POCT RAPID STREP A (OFFICE): Rapid Strep A Screen: NEGATIVE

## 2024-01-31 LAB — POC SOFIA SARS ANTIGEN FIA: SARS Coronavirus 2 Ag: NEGATIVE

## 2024-01-31 NOTE — ED Triage Notes (Signed)
 Patient states cough and sore throat since yesterday.  Started taking mucinex  today

## 2024-01-31 NOTE — Discharge Instructions (Addendum)
 The COVID and strep tests are negative.   Take Tylenol  or ibuprofen as needed for fever or discomfort.  Take plain Mucinex as needed for congestion.  Rest and keep yourself hydrated.    Follow-up with your primary care provider if your symptoms are not improving.

## 2024-01-31 NOTE — ED Provider Notes (Signed)
 Olivia Mendez    CSN: [id number] Arrival date & time: 01/31/24  8147      History   Chief Complaint Chief Complaint  Patient presents with   Cough   Sore Throat    HPI Olivia Mendez is a 58 y.o. female.  Patient presents with 1 day history of sore throat and cough.  Treating with Mucinex .  No fever, shortness of breath, vomiting, diarrhea.  The history is provided by the patient and medical records.    Past Medical History:  Diagnosis Date   Allergic rhinitis 12/26/2014   Aortic aneurysm (HCC) 2018   Bicuspid aortic valve 12/14/2020   Endometriosis 1990's   Hypothyroid    Migraines     Patient Active Problem List   Diagnosis Date Noted   Hepatic steatosis 08/21/2023   History of colonic polyps 06/27/2022   Vitamin D  deficiency 07/25/2021   B12 deficiency 07/25/2021   Bicuspid aortic valve 12/14/2020   Allergic rhinitis 04/09/2019   Thoracic ascending aortic aneurysm (HCC) 03/18/2017   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/05/2015   Lower leg edema 11/13/2014   Stricture and stenosis of esophagus 02/25/2009   Diaphragmatic hernia 02/25/2009   Diverticulosis of colon 02/25/2009   GERD 12/01/2008   Hypothyroidism 09/14/2008   Mixed hyperlipidemia 09/14/2008    Past Surgical History:  Procedure Laterality Date   APPENDECTOMY     CHOLECYSTECTOMY     OTHER SURGICAL HISTORY     surgery for Endometriosis   TEE WITHOUT CARDIOVERSION N/A 03/22/2017   Procedure: TRANSESOPHAGEAL ECHOCARDIOGRAM (TEE);  Surgeon: Ladona Heinz, MD;  Location: [facility] ENDOSCOPY;  Service: Cardiovascular;  Laterality: N/A;    OB History     Gravida  0   Para  0   Term  0   Preterm  0   AB  0   Living  0      SAB  0   IAB  0   Ectopic  0   Multiple  0   Live Births  0            Home Medications    Prior to Admission medications   Medication Sig Start Date End Date Taking? Authorizing Provider  Cholecalciferol (VITAMIN D -3 PO) Take 2,000 Units by mouth.    [provider]  Cyanocobalamin  (VITAMIN B12) 1000 MCG TBCR 1 day or 1 dose.    [provider]  cyclobenzaprine  (FLEXERIL ) 10 MG tablet Take 1 tablet (10 mg total) by mouth at bedtime as needed (TMJ). 01/07/24   Jodie Lavern CROME, MD  diclofenac  (VOLTAREN ) 75 MG EC tablet Take 1 tablet (75 mg total) by mouth 2 (two) times daily. 01/07/24   Jodie Lavern CROME, MD  levothyroxine  (SYNTHROID ) 125 MCG tablet Take 1 tablet (125 mcg total) by mouth daily. 09/03/23   Thapa, [place], MD  liothyronine  (CYTOMEL ) 5 MCG tablet Take 1 tablet (5 mcg total) by mouth daily. 09/03/23   Thapa, [place], MD  losartan  (COZAAR ) 100 MG tablet TAKE 1 TABLET BY MOUTH EVERY DAY 12/27/23   Ladona Heinz, MD  pantoprazole  (PROTONIX ) 40 MG tablet Take 1 tablet (40 mg total) by mouth daily. 07/25/21   Norleen Lynwood ORN, MD  vitamin B-12 (CYANOCOBALAMIN ) 100 MCG tablet Take 100 mcg by mouth daily.    [provider]    Family History Family History  Problem Relation Age of Onset   Diabetes Mother    Healthy Father    Thyroid  disease Maternal Aunt  Multiple sclerosis Maternal Grandmother    Heart disease Maternal Grandfather    Cancer Paternal Grandmother        melanoma   Heart disease Paternal Grandfather    Thyroid  disease Cousin     Social History Social History   Tobacco Use   Smoking status: Never   Smokeless tobacco: Never  Vaping Use   Vaping status: Never Used  Substance Use Topics   Alcohol use: Yes    Comment: Rare   Drug use: No     Allergies   Vicodin [hydrocodone-acetaminophen]   Review of Systems Review of Systems  Constitutional:  Negative for chills and fever.  HENT:  Positive for sore throat. Negative for ear pain.   Respiratory:  Positive for cough. Negative for shortness of breath.   Gastrointestinal:  Negative for diarrhea and vomiting.     Physical Exam Triage Vital Signs ED Triage Vitals  Encounter Vitals Group     BP      Girls Systolic BP Percentile      Girls Diastolic  BP Percentile      Boys Systolic BP Percentile      Boys Diastolic BP Percentile      Pulse      Resp      Temp      Temp src      SpO2      Weight      Height      Head Circumference      Peak Flow      Pain Score      Pain Loc      Pain Education      Exclude from Growth Chart    No data found.  Updated Vital Signs BP 121/85 (BP Location: Left Arm)   Pulse 78   Temp 98.3 F (36.8 C) (Oral)   Resp 20   Ht 5' 8 (1.727 m)   Wt 195 lb (88.5 kg)   LMP 10/03/2008   SpO2 98%   BMI 29.65 kg/m   Visual Acuity Right Eye Distance:   Left Eye Distance:   Bilateral Distance:    Right Eye Near:   Left Eye Near:    Bilateral Near:     Physical Exam Constitutional:      General: She is not in acute distress. HENT:     Right Ear: Tympanic membrane normal.     Left Ear: Tympanic membrane normal.     Nose: Nose normal.     Mouth/Throat:     Mouth: Mucous membranes are moist.     Pharynx: Oropharynx is clear.  Cardiovascular:     Rate and Rhythm: Normal rate and regular rhythm.     Heart sounds: Normal heart sounds.  Pulmonary:     Effort: Pulmonary effort is normal. No respiratory distress.     Breath sounds: Normal breath sounds.  Neurological:     Mental Status: She is alert.      UC Treatments / Results  Labs (all labs ordered are listed, but only abnormal results are displayed) Labs Reviewed  POC SOFIA SARS ANTIGEN FIA  POCT RAPID STREP A (OFFICE)    EKG   Radiology No results found.  Procedures Procedures (including critical care time)  Medications Ordered in UC Medications - No data to display  Initial Impression / Assessment and Plan / UC Course  I have reviewed the triage vital signs and the nursing notes.  Pertinent labs & imaging results that were available during  my care of the patient were reviewed by me and considered in my medical decision making (see chart for details).    Cough, sore throat.  Afebrile and vital signs are stable.   Lungs are clear and O2 sat is 98% on room air.  Rapid strep and COVID are negative.  Discussed symptomatic treatment including Tylenol as needed.  Education provided on viral respiratory infection.  Instructed patient to follow-up with her PCP if she is not improving.  ED precautions given.  Patient agrees to plan of care.  Final Clinical Impressions(s) / UC Diagnoses   Final diagnoses:  Acute cough  Sore throat  Viral URI     Discharge Instructions      The COVID and strep tests are negative.   Take Tylenol or ibuprofen as needed for fever or discomfort.  Take plain Mucinex  as needed for congestion.  Rest and keep yourself hydrated.    Follow-up with your primary care provider if your symptoms are not improving.         ED Prescriptions   None    PDMP not reviewed this encounter.   Corlis Burnard DEL, NP 01/31/24 2014

## 2024-02-02 ENCOUNTER — Encounter: Payer: Self-pay | Admitting: Emergency Medicine

## 2024-02-02 ENCOUNTER — Ambulatory Visit
Admission: EM | Admit: 2024-02-02 | Discharge: 2024-02-02 | Disposition: A | Discharge status: Home / Self Care | Attending: Emergency Medicine | Admitting: Emergency Medicine

## 2024-02-02 DIAGNOSIS — J069 Acute upper respiratory infection, unspecified: Secondary | ICD-10-CM

## 2024-02-02 MED ORDER — PREDNISONE 10 MG (21) PO TBPK: ORAL_TABLET | Freq: Every day | ORAL | 0 refills | Status: DC

## 2024-02-02 MED ORDER — BENZONATATE 100 MG PO CAPS: 100.0000 mg | ORAL_CAPSULE | Freq: Three times a day (TID) | ORAL | 0 refills | Status: DC

## 2024-02-02 MED ORDER — AMOXICILLIN-POT CLAVULANATE 875-125 MG PO TABS: 1.0000 | ORAL_TABLET | Freq: Two times a day (BID) | ORAL | 0 refills | Status: DC

## 2024-02-02 MED ORDER — GUAIFENESIN-CODEINE 100-10 MG/5ML PO SOLN: 5.0000 mL | Freq: Four times a day (QID) | ORAL | 0 refills | Status: DC | PRN

## 2024-02-02 NOTE — ED Triage Notes (Signed)
 Patient reports was seen here on 01/31/24 for sore throat and cough. Patient now states symptoms are worse and she now has diarrhea that started yesterday and continues today. Patient states she has green phlegm and nasal congestion. Rates sore throat pain 10/10.

## 2024-02-02 NOTE — Discharge Instructions (Signed)
 Your symptoms today are most likely being caused by a virus and should steadily improve in time it can take up to 7 to 10 days before you truly start to see a turnaround however things will get better  Take Augmentin  twice daily for 7 days so symptoms do not progress to more serious lung condition such as pneumonia  Starting tomorrow take prednisone  every morning with food to reduce inflammation and help with discomfort, should help with chest discomfort, wheezing  You may use Tessalon  pill every 8 hours for cough, may use cough syrup every 6 hours as needed for shortness of breath    You can take Tylenol and/or Ibuprofen as needed for fever reduction and pain relief.   For cough: honey 1/2 to 1 teaspoon (you can dilute the honey in water or another fluid).  You can also use guaifenesin  and dextromethorphan for cough. You can use a humidifier for chest congestion and cough.  If you don't have a humidifier, you can sit in the bathroom with the hot shower running.      For sore throat: try warm salt water gargles, cepacol lozenges, throat spray, warm tea or water with lemon/honey, popsicles or ice, or OTC cold relief medicine for throat discomfort.   For congestion: take a daily anti-histamine like Zyrtec , Claritin, and a oral decongestant, such as pseudoephedrine .  You can also use Flonase  1-2 sprays in each nostril daily.   It is important to stay hydrated: drink plenty of fluids (water, gatorade/powerade/pedialyte, juices, or teas) to keep your throat moisturized and help further relieve irritation/discomfort.

## 2024-02-03 NOTE — ED Provider Notes (Signed)
 Olivia Mendez    CSN: [id number] Arrival date & time: 02/02/24  1438      History   Chief Complaint Chief Complaint  Patient presents with   Cough   Sore Throat   Diarrhea    HPI Olivia Mendez is a 58 y.o. female.   Patient presents for evaluation of chills, sore throat, hoarseness, generalized bodyaches, intermittently productive cough with green sputum, nasal congestion and diarrhea beginning 1 day ago.  Has begun to experience centralized chest soreness and heaviness exacerbated by cough, intermittent wheezing with coughing.  Was evaluated 4 days ago in this urgent care, COVID and strep testing negative.  Patient endorses symptoms progressively worsening.  Decreased appetite but able to tolerate some foods.  Has attempted use of Mucinex  DM, decongestants and Tylenol.  Denies presence of fever.  Past Medical History:  Diagnosis Date   Allergic rhinitis 12/26/2014   Aortic aneurysm (HCC) 2018   Bicuspid aortic valve 12/14/2020   Endometriosis 1990's   Hypothyroid    Migraines     Patient Active Problem List   Diagnosis Date Noted   Hepatic steatosis 08/21/2023   History of colonic polyps 06/27/2022   Vitamin D  deficiency 07/25/2021   B12 deficiency 07/25/2021   Bicuspid aortic valve 12/14/2020   Allergic rhinitis 04/09/2019   Thoracic ascending aortic aneurysm (HCC) 03/18/2017   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/05/2015   Lower leg edema 11/13/2014   Stricture and stenosis of esophagus 02/25/2009   Diaphragmatic hernia 02/25/2009   Diverticulosis of colon 02/25/2009   GERD 12/01/2008   Hypothyroidism 09/14/2008   Mixed hyperlipidemia 09/14/2008    Past Surgical History:  Procedure Laterality Date   APPENDECTOMY     CHOLECYSTECTOMY     OTHER SURGICAL HISTORY     surgery for Endometriosis   TEE WITHOUT CARDIOVERSION N/A 03/22/2017   Procedure: TRANSESOPHAGEAL ECHOCARDIOGRAM (TEE);  Surgeon: Ladona Heinz, MD;  Location: [facility] ENDOSCOPY;  Service: Cardiovascular;   Laterality: N/A;    OB History     Gravida  0   Para  0   Term  0   Preterm  0   AB  0   Living  0      SAB  0   IAB  0   Ectopic  0   Multiple  0   Live Births  0            Home Medications    Prior to Admission medications   Medication Sig Start Date End Date Taking? Authorizing Provider  amoxicillin -clavulanate (AUGMENTIN ) 875-125 MG tablet Take 1 tablet by mouth every 12 (twelve) hours. 02/02/24  Yes Tyion Boylen R, NP  benzonatate  (TESSALON ) 100 MG capsule Take 1 capsule (100 mg total) by mouth every 8 (eight) hours. 02/02/24  Yes Shia Delaine R, NP  guaiFENesin -codeine  100-10 MG/5ML syrup Take 5 mLs by mouth every 6 (six) hours as needed for cough. 02/02/24  Yes Alexus Michael R, NP  predniSONE  (STERAPRED UNI-PAK 21 TAB) 10 MG (21) TBPK tablet Take by mouth daily. Take 6 tabs by mouth daily  for 1 days, then 5 tabs for 1 days, then 4 tabs for 1 days, then 3 tabs for 1 days, 2 tabs for 1 days, then 1 tab by mouth daily for 1 days 02/02/24  Yes Blessing Ozga R, NP  Cholecalciferol (VITAMIN D -3 PO) Take 2,000 Units by mouth.    [provider]  Cyanocobalamin  (VITAMIN B12) 1000 MCG TBCR 1 day or 1 dose.  [provider]  cyclobenzaprine  (FLEXERIL ) 10 MG tablet Take 1 tablet (10 mg total) by mouth at bedtime as needed (TMJ). 01/07/24   Jodie Lavern CROME, MD  diclofenac  (VOLTAREN ) 75 MG EC tablet Take 1 tablet (75 mg total) by mouth 2 (two) times daily. 01/07/24   Jodie Lavern CROME, MD  levothyroxine  (SYNTHROID ) 125 MCG tablet Take 1 tablet (125 mcg total) by mouth daily. 09/03/23   Thapa, [place], MD  liothyronine  (CYTOMEL ) 5 MCG tablet Take 1 tablet (5 mcg total) by mouth daily. 09/03/23   Thapa, [place], MD  losartan  (COZAAR ) 100 MG tablet TAKE 1 TABLET BY MOUTH EVERY DAY 12/27/23   Ladona Heinz, MD  pantoprazole  (PROTONIX ) 40 MG tablet Take 1 tablet (40 mg total) by mouth daily. 07/25/21   Norleen Lynwood ORN, MD  vitamin B-12 (CYANOCOBALAMIN ) 100 MCG tablet  Take 100 mcg by mouth daily.    [provider]    Family History Family History  Problem Relation Age of Onset   Diabetes Mother    Healthy Father    Thyroid  disease Maternal Aunt    Multiple sclerosis Maternal Grandmother    Heart disease Maternal Grandfather    Cancer Paternal Grandmother        melanoma   Heart disease Paternal Grandfather    Thyroid  disease Cousin     Social History Social History   Tobacco Use   Smoking status: Never   Smokeless tobacco: Never  Vaping Use   Vaping status: Never Used  Substance Use Topics   Alcohol use: Yes    Comment: Rare   Drug use: No     Allergies   Vicodin [hydrocodone-acetaminophen]   Review of Systems Review of Systems  Respiratory:  Positive for cough.   Gastrointestinal:  Positive for diarrhea.     Physical Exam Triage Vital Signs ED Triage Vitals  Encounter Vitals Group     BP 02/02/24 1527 129/85     Girls Systolic BP Percentile --      Girls Diastolic BP Percentile --      Boys Systolic BP Percentile --      Boys Diastolic BP Percentile --      Pulse Rate 02/02/24 1527 68     Resp 02/02/24 1527 20     Temp 02/02/24 1527 98.3 F (36.8 C)     Temp Source 02/02/24 1527 Oral     SpO2 02/02/24 1527 100 %     Weight --      Height --      Head Circumference --      Peak Flow --      Pain Score 02/02/24 1525 10     Pain Loc --      Pain Education --      Exclude from Growth Chart --    No data found.  Updated Vital Signs BP 129/85 (BP Location: Left Arm)   Pulse 68   Temp 98.3 F (36.8 C) (Oral)   Resp 20   LMP 10/03/2008   SpO2 100%   Visual Acuity Right Eye Distance:   Left Eye Distance:   Bilateral Distance:    Right Eye Near:   Left Eye Near:    Bilateral Near:     Physical Exam Constitutional:      Appearance: She is ill-appearing.  HENT:     Head: Normocephalic.     Right Ear: Tympanic membrane, ear canal and external ear normal.     Left Ear: Tympanic membrane, ear  canal and external ear normal.     Nose: Congestion present.     Mouth/Throat:     Mouth: Mucous membranes are moist.     Pharynx: Oropharynx is clear. No oropharyngeal exudate or posterior oropharyngeal erythema.  Eyes:     Extraocular Movements: Extraocular movements intact.  Cardiovascular:     Rate and Rhythm: Normal rate and regular rhythm.     Pulses: Normal pulses.     Heart sounds: Normal heart sounds.  Pulmonary:     Effort: Pulmonary effort is normal.     Breath sounds: Normal breath sounds.  Musculoskeletal:     Cervical back: Normal range of motion and neck supple.  Lymphadenopathy:     Cervical: Cervical adenopathy present.  Neurological:     Mental Status: She is alert and oriented to person, place, and time. Mental status is at baseline.      UC Treatments / Results  Labs (all labs ordered are listed, but only abnormal results are displayed) Labs Reviewed - No data to display  EKG   Radiology No results found.  Procedures Procedures (including critical care time)  Medications Ordered in UC Medications - No data to display  Initial Impression / Assessment and Plan / UC Course  I have reviewed the triage vital signs and the nursing notes.  Pertinent labs & imaging results that were available during my care of the patient were reviewed by me and considered in my medical decision making (see chart for details).  Acute URI  Patient is in no signs of distress nor toxic appearing.  Vital signs are stable.  Low suspicion for pneumonia, pneumothorax or bronchitis and therefore will defer imaging.  Testing completed 4 days ago, will defer at this time, etiology still most likely viral, discussed with patient.  Empirically placed on Augmentin  as symptoms are progressively worsening, prescribed prednisone , Tessalon  and guaifenesin  codeine  for additional supportive care, PDMP reviewed, low risk.May use additional over-the-counter medications as needed for supportive  care.  May follow-up with urgent care as needed if symptoms persist or worsen.   Final Clinical Impressions(s) / UC Diagnoses   Final diagnoses:  Acute URI     Discharge Instructions      Your symptoms today are most likely being caused by a virus and should steadily improve in time it can take up to 7 to 10 days before you truly start to see a turnaround however things will get better  Take Augmentin  twice daily for 7 days so symptoms do not progress to more serious lung condition such as pneumonia  Starting tomorrow take prednisone  every morning with food to reduce inflammation and help with discomfort, should help with chest discomfort, wheezing  You may use Tessalon  pill every 8 hours for cough, may use cough syrup every 6 hours as needed for shortness of breath    You can take Tylenol and/or Ibuprofen as needed for fever reduction and pain relief.   For cough: honey 1/2 to 1 teaspoon (you can dilute the honey in water or another fluid).  You can also use guaifenesin  and dextromethorphan for cough. You can use a humidifier for chest congestion and cough.  If you don't have a humidifier, you can sit in the bathroom with the hot shower running.      For sore throat: try warm salt water gargles, cepacol lozenges, throat spray, warm tea or water with lemon/honey, popsicles or ice, or OTC cold relief medicine for throat discomfort.   For congestion: take  a daily anti-histamine like Zyrtec , Claritin, and a oral decongestant, such as pseudoephedrine .  You can also use Flonase  1-2 sprays in each nostril daily.   It is important to stay hydrated: drink plenty of fluids (water, gatorade/powerade/pedialyte, juices, or teas) to keep your throat moisturized and help further relieve irritation/discomfort.    ED Prescriptions     Medication Sig Dispense Auth. Provider   amoxicillin -clavulanate (AUGMENTIN ) 875-125 MG tablet Take 1 tablet by mouth every 12 (twelve) hours. 14 tablet Gavyn Ybarra,  Bayler Nehring R, NP   predniSONE  (STERAPRED UNI-PAK 21 TAB) 10 MG (21) TBPK tablet Take by mouth daily. Take 6 tabs by mouth daily  for 1 days, then 5 tabs for 1 days, then 4 tabs for 1 days, then 3 tabs for 1 days, 2 tabs for 1 days, then 1 tab by mouth daily for 1 days 21 tablet Teasha Murrillo R, NP   benzonatate  (TESSALON ) 100 MG capsule Take 1 capsule (100 mg total) by mouth every 8 (eight) hours. 21 capsule Delene Morais R, NP   guaiFENesin -codeine  100-10 MG/5ML syrup Take 5 mLs by mouth every 6 (six) hours as needed for cough. 120 mL Cosimo Schertzer, Shelba SAUNDERS, NP      I have reviewed the PDMP during this encounter.   Teresa Shelba SAUNDERS, [place] 02/03/24 [phone]

## 2024-02-25 DIAGNOSIS — K219 Gastro-esophageal reflux disease without esophagitis: Secondary | ICD-10-CM | POA: Diagnosis not present

## 2024-02-25 DIAGNOSIS — Z860100 Personal history of colon polyps, unspecified: Secondary | ICD-10-CM | POA: Diagnosis not present

## 2024-03-10 ENCOUNTER — Other Ambulatory Visit: Payer: Self-pay | Admitting: Cardiology

## 2024-03-10 MED ORDER — LOSARTAN POTASSIUM 100 MG PO TABS: 100.0000 mg | ORAL_TABLET | Freq: Every day | ORAL | 0 refills | Status: DC

## 2024-04-05 ENCOUNTER — Other Ambulatory Visit: Payer: Self-pay | Admitting: Family Medicine

## 2024-05-26 ENCOUNTER — Telehealth: Payer: Self-pay | Admitting: Cardiology

## 2024-05-26 DIAGNOSIS — R252 Cramp and spasm: Secondary | ICD-10-CM

## 2024-05-26 DIAGNOSIS — Z79899 Other long term (current) drug therapy: Secondary | ICD-10-CM

## 2024-05-26 NOTE — Telephone Encounter (Signed)
" °  Per MyChart scheduling message:  I'm having some issues with horrible leg cramps waking me up at night and leg / ankle pain that hobbles me after being in a fixed postition (sitting in a car for an hour) or when waking up in the morning. I need to have this checked out, but am not sure I should wait until my yearly aneurysm appt scheduled for 06/19/24.  "

## 2024-05-26 NOTE — Telephone Encounter (Signed)
 Left message for pt to call back

## 2024-05-26 NOTE — Telephone Encounter (Signed)
 Patient was returning call. Please advise ?

## 2024-05-26 NOTE — Telephone Encounter (Signed)
 Attempted to call pt, voice mail is full, unable to leave message.

## 2024-05-27 ENCOUNTER — Ambulatory Visit: Payer: Self-pay

## 2024-05-27 ENCOUNTER — Other Ambulatory Visit: Payer: Self-pay | Admitting: Cardiology

## 2024-05-27 NOTE — Telephone Encounter (Signed)
 Returned patient's call, 2 identifiers used. Patient reports she has a history of right knee arthritis, but over the past 2-3 weeks she is noticing cramps in bilateral legs, often at night. She states she feels the cramps in the back of her legs and her thighs and this is disturbing her sleep. She also reports that she has had pain in her right ankle with standing and difficulty lifting her left leg at times, usually after sitting for awhile. She also notes some intermittent numbness in her left toes after driving to work. She states her symptoms get better after walking. She denies any redness or warmth anywhere on her legs, also denies any coolness to limbs. Patient further denies any chest pain or SOB. She has an appt with Dr. Ladona on 06/20/23 but is asking for sooner appointment or for labs to assess  Advised I would reach out to Dr. Ladona to see if he wanted to run labs or reschedule her appt, but also discussed that she might be better served to be seen by her PCP or orthopedist as these symptoms could be caused orthopedic or spine injury/inflammation. She states she has not seen her PCP or orthopedist in 6 months but does have a history of right knee arthritis which is relieved by antiflammatory medications. Patient states she will contact PCP/orthopedist today and at this time is willing to wait for appt with Dr. Ladona on 06/20/23 but would appreciate lab orders to check electrolytes. Discussed ED precautions, patient verbalizes understanding to go to ED for any worsening weakness of legs, increased ankle or knee pain, redness/warmth of legs or cool feeling of limbs. Forwarded to Dr. Ladona.

## 2024-05-27 NOTE — Telephone Encounter (Signed)
 FYI Only or Action Required?: FYI only for provider: appointment scheduled on 05/30/2024.  Patient was last seen in primary care on 01/07/2024 by Jodie Lavern CROME, MD.  Called Nurse Triage reporting Pain.  Symptoms began x worsening x week.  Interventions attempted: Nothing.  Symptoms are: gradually worsening.  Triage Disposition: See PCP When Office is Open (Within 3 Days)  Patient/caregiver understands and will follow disposition?: Yes     Copied from CRM #[id number]. Topic: Clinical - Red Word Triage >> May 27, 2024  8:43 AM Viola F wrote: Red Word that prompted transfer to Nurse Triage: Patient having leg cramps at night and pain in left ankle and foot, mid toe numb - requesting appt Reason for Disposition  [1] MODERATE pain (e.g., interferes with normal activities, limping) AND [2] present > 3 days  Answer Assessment - Initial Assessment Questions 1. ONSET: When did the pain start?      2 weeks or so and worsening x week - in which pain has not being easing off 2. LOCATION: Where is the pain located?      Left foot and ankle 3. PAIN: How bad is the pain?    (Scale 1-10; or mild, moderate, severe)     When first start to walk on foot 8 to 9/10 pain for about 30 to 45 minutes then pain goes down 4. WORK OR EXERCISE: Has there been any recent work or exercise that involved this part of the body?      no 5. CAUSE: What do you think is causing the foot pain?     unsure 6. OTHER SYMPTOMS: Do you have any other symptoms? (e.g., leg pain, rash, fever, numbness)     Numbness left foot middle 3 toes 7. PREGNANCY: Is there any chance you are pregnant? When was your last menstrual period?     Na  Also has bilateral leg & feet cramps at night for about 6 months  Protocols used: Foot Pain-A-AH

## 2024-05-27 NOTE — Addendum Note (Signed)
 Addended by: Giovannie Scerbo L on: 05/27/2024 10:25 AM   Modules accepted: Orders

## 2024-05-27 NOTE — Telephone Encounter (Signed)
 Call to patient to advise that Dr. Ladona has ordered labs that can be completed at any [facility]. Patient verbalizes understanding and advises she has an appt with PCP for evaluation of spine and legs on Friday 05/30/24.

## 2024-05-27 NOTE — Telephone Encounter (Signed)
 Suspect her symptoms are probably neurologic. We can order CMP and Mg levels please. Thank you

## 2024-05-27 NOTE — Telephone Encounter (Signed)
 Patient returned RN's call.

## 2024-05-27 NOTE — Telephone Encounter (Signed)
Pt scheduled for OV.  

## 2024-05-30 ENCOUNTER — Encounter: Payer: Self-pay | Admitting: Family

## 2024-05-30 ENCOUNTER — Ambulatory Visit

## 2024-05-30 ENCOUNTER — Ambulatory Visit: Admitting: Family

## 2024-05-30 VITALS — BP 136/82 | HR 63 | Temp 97.5°F | Ht 68.0 in | Wt 201.4 lb

## 2024-05-30 DIAGNOSIS — M25572 Pain in left ankle and joints of left foot: Secondary | ICD-10-CM

## 2024-05-30 NOTE — Progress Notes (Signed)
 "  Patient ID: Olivia Mendez, female    DOB: [birth year], 58 y.o.   MRN: [id number]  Chief Complaint  Patient presents with   Ankle Pain    Pt c/o left ankle pain, present for 2-3 weeks. Has tried acetaminophen, which does not help sx.   Discussed the use of AI scribe software for clinical note transcription with the patient, who gave verbal consent to proceed.  History of Present Illness Olivia Mendez is a 58 year old female who presents with left ankle pain.  She has had left ankle pain for about three weeks, described as shooting pain with numbness and tingling in the three middle toes. The pain is intermittent, worse after sitting for long periods such as her hour-long commute and with lifting the foot, and is more noticeable on first getting up in the morning. She denies any injury or trauma. 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 Symptoms are more pronounced after driving. Numbness and tingling in the toes are persistent and sometimes worsen. She takes diclofenac  twice daily for a prior knee issue, which is controlled, but it does not relieve the ankle pain. She denies diabetes and takes thyroid  medication. She has an aortic aneurysm. She had plantar fasciitis in the right foot in the past but feels this pain is different and does not feel pain on bottom of foot or heel. She has not noticed significant ankle swelling, though she notes some leg swelling. She wears shoes with mild to moderate arch support and does not use special insoles.  Assessment & Plan Acute left ankle pain Pain exacerbated by standing and walking, with numbness in toes. Differential includes bone spur or structural issues, plantar fasciitis, or r/t edema. Diclofenac  ineffective. No significant ankle swelling, but leg swelling present. Aneurysm well-managed. - Ordered x-ray of left ankle and foot to rule out bone spur or structural issues. - Referred to foot and ankle specialist for further evaluation. - Advised use  of mild compression stockings to aid blood flow and reduce leg fatigue/swelling. - Recommended elevating the legs whenever resting possible.   Subjective:    Outpatient Medications Prior to Visit  Medication Sig Dispense Refill   Cholecalciferol (VITAMIN D -3 PO) Take 2,000 Units by mouth.     Cyanocobalamin  (VITAMIN B12) 1000 MCG TBCR 1 day or 1 dose.     cyclobenzaprine  (FLEXERIL ) 10 MG tablet Take 1 tablet (10 mg total) by mouth at bedtime as needed (TMJ). 90 tablet 1   diclofenac  (VOLTAREN ) 75 MG EC tablet TAKE 1 TABLET BY MOUTH TWICE A DAY 60 tablet 1   levothyroxine  (SYNTHROID ) 125 MCG tablet Take 1 tablet (125 mcg total) by mouth daily. 90 tablet 3   liothyronine  (CYTOMEL ) 5 MCG tablet Take 1 tablet (5 mcg total) by mouth daily. 90 tablet 3   losartan  (COZAAR ) 100 MG tablet TAKE 1 TABLET BY MOUTH EVERY DAY 90 tablet 1   pantoprazole  (PROTONIX ) 40 MG tablet Take 1 tablet (40 mg total) by mouth daily. 90 tablet 3   vitamin B-12 (CYANOCOBALAMIN ) 100 MCG tablet Take 100 mcg by mouth daily.     amoxicillin -clavulanate (AUGMENTIN ) 875-125 MG tablet Take 1 tablet by mouth every 12 (twelve) hours. 14 tablet 0   benzonatate  (TESSALON ) 100 MG capsule Take 1 capsule (100 mg total) by mouth every 8 (eight) hours. 21 capsule 0   guaiFENesin -codeine  100-10 MG/5ML syrup Take 5 mLs by mouth every 6 (six) hours as needed for cough. 120 mL 0  predniSONE  (STERAPRED UNI-PAK 21 TAB) 10 MG (21) TBPK tablet Take by mouth daily. Take 6 tabs by mouth daily  for 1 days, then 5 tabs for 1 days, then 4 tabs for 1 days, then 3 tabs for 1 days, 2 tabs for 1 days, then 1 tab by mouth daily for 1 days 21 tablet 0   No facility-administered medications prior to visit.   Past Medical History:  Diagnosis Date   Allergic rhinitis 12/26/2014   Aortic aneurysm 2018   Bicuspid aortic valve 12/14/2020   Endometriosis 1990's   Hypothyroid    Migraines    Past Surgical History:  Procedure Laterality Date    APPENDECTOMY     CHOLECYSTECTOMY     OTHER SURGICAL HISTORY     surgery for Endometriosis   TEE WITHOUT CARDIOVERSION N/A 03/22/2017   Procedure: TRANSESOPHAGEAL ECHOCARDIOGRAM (TEE);  Surgeon: Ladona Heinz, MD;  Location: [facility] ENDOSCOPY;  Service: Cardiovascular;  Laterality: N/A;   Allergies[1]    Objective:    Physical Exam Vitals and nursing note reviewed.  Constitutional:      Appearance: Normal appearance. She is obese.  Cardiovascular:     Rate and Rhythm: Normal rate and regular rhythm.  Pulmonary:     Effort: Pulmonary effort is normal.     Breath sounds: Normal breath sounds.  Musculoskeletal:        General: Normal range of motion.     Right lower leg: 1+ Edema present.     Left lower leg: 1+ Edema present.     Left ankle: Swelling (mild) present. No ecchymosis. No tenderness. Normal range of motion.     Left Achilles Tendon: No tenderness.  Skin:    General: Skin is warm and dry.  Neurological:     Mental Status: She is alert.  Psychiatric:        Mood and Affect: Mood normal.        Behavior: Behavior normal.    BP 136/82 (BP Location: Left Arm, Patient Position: Sitting, Cuff Size: Large)   Pulse 63   Temp (!) 97.5 F (36.4 C) (Temporal)   Ht 5' 8 (1.727 m)   Wt 201 lb 6 oz (91.3 kg)   LMP 10/03/2008   SpO2 99%   BMI 30.62 kg/m  Wt Readings from Last 3 Encounters:  05/30/24 201 lb 6 oz (91.3 kg)  01/31/24 195 lb (88.5 kg)  01/07/24 192 lb 3.2 oz (87.2 kg)      Ashur Glatfelter, NP     [1]  Allergies Allergen Reactions   Vicodin [Hydrocodone-Acetaminophen] Nausea And Vomiting   "

## 2024-05-31 ENCOUNTER — Other Ambulatory Visit: Payer: Self-pay | Admitting: Family Medicine

## 2024-06-04 LAB — COMPREHENSIVE METABOLIC PANEL WITH GFR
ALT: 18 IU/L (ref 0–32)
AST: 18 IU/L (ref 0–40)
Albumin: 4.3 g/dL (ref 3.8–4.9)
Alkaline Phosphatase: 79 IU/L (ref 49–135)
BUN/Creatinine Ratio: 20 (ref 9–23)
BUN: 15 mg/dL (ref 6–24)
Bilirubin Total: 0.8 mg/dL (ref 0.0–1.2)
CO2: 23 mmol/L (ref 20–29)
Calcium: 10.3 mg/dL — ABNORMAL HIGH (ref 8.7–10.2)
Chloride: 102 mmol/L (ref 96–106)
Creatinine, Ser: 0.76 mg/dL (ref 0.57–1.00)
Globulin, Total: 2.2 g/dL (ref 1.5–4.5)
Glucose: 104 mg/dL — ABNORMAL HIGH (ref 70–99)
Potassium: 4.7 mmol/L (ref 3.5–5.2)
Sodium: 138 mmol/L (ref 134–144)
Total Protein: 6.5 g/dL (ref 6.0–8.5)
eGFR: 90 mL/min/1.73

## 2024-06-04 LAB — MAGNESIUM: Magnesium: 2 mg/dL (ref 1.6–2.3)

## 2024-06-05 ENCOUNTER — Ambulatory Visit: Payer: Self-pay | Admitting: Cardiology

## 2024-06-10 ENCOUNTER — Encounter: Payer: Self-pay | Admitting: Podiatry

## 2024-06-10 ENCOUNTER — Ambulatory Visit: Admitting: Podiatry

## 2024-06-10 DIAGNOSIS — M722 Plantar fascial fibromatosis: Secondary | ICD-10-CM

## 2024-06-10 MED ORDER — MELOXICAM 15 MG PO TABS: 15.0000 mg | ORAL_TABLET | Freq: Every day | ORAL | 3 refills | Status: AC

## 2024-06-10 MED ORDER — TRIAMCINOLONE ACETONIDE 40 MG/ML IJ SUSP
20.0000 mg | Freq: Once | INTRAMUSCULAR | Status: AC
  Administered 2024-06-10: 20 mg

## 2024-06-10 MED ORDER — METHYLPREDNISOLONE 4 MG PO TBPK: ORAL_TABLET | ORAL | 0 refills | Status: AC

## 2024-06-10 NOTE — Progress Notes (Signed)
 "  Subjective:  Patient ID: Olivia Mendez, female    DOB: [birth year],  MRN: [id number] HPI Chief Complaint  Patient presents with   Ankle Pain    Medial ankle left - aching x 1 month, intense pain after sitting for awhile, takes voltaren  daily for knee and sometimes adds in Tylenol, noticed recently her 2-4 toes are numb, stands all day at work and drives an hour to and from   [name] Patient (Initial Visit)    59 y.o. female presents with the above complaint.   ROS: Denies fever chills nausea vomiting muscle aches pains calf pain back pain chest pain shortness of breath.  States that she has had thighs painful for the past few months as well as cramping in her feet and legs.  She has also had pain that comes across the medial aspect of her left arch and up into her medial left ankle.  Denies any trauma.  Past Medical History:  Diagnosis Date   Allergic rhinitis 12/26/2014   Aortic aneurysm 2018   Bicuspid aortic valve 12/14/2020   Endometriosis 1990's   Hypothyroid    Migraines    Past Surgical History:  Procedure Laterality Date   APPENDECTOMY     CHOLECYSTECTOMY     OTHER SURGICAL HISTORY     surgery for Endometriosis   TEE WITHOUT CARDIOVERSION N/A 03/22/2017   Procedure: TRANSESOPHAGEAL ECHOCARDIOGRAM (TEE);  Surgeon: Ladona Heinz, MD;  Location: [facility] ENDOSCOPY;  Service: Cardiovascular;  Laterality: N/A;   Current Medications[1]  Allergies[2] Review of Systems Objective:  There were no vitals filed for this visit.  General: Well developed, nourished, in no acute distress, alert and oriented x3   Dermatological: Skin is warm, dry and supple bilateral. Nails x 10 are well maintained; remaining integument appears unremarkable at this time. There are no open sores, no preulcerative lesions, no rash or signs of infection present.  Vascular: Dorsalis Pedis artery and Posterior Tibial artery pedal pulses are 2/4 bilateral with immedate capillary fill time. Pedal hair growth present. No  varicosities and no lower extremity edema present bilateral.   Neruologic: Grossly intact via light touch bilateral. Vibratory intact via tuning fork bilateral. Protective threshold with Semmes Wienstein monofilament intact to all pedal sites bilateral. Patellar and Achilles deep tendon reflexes 2+ bilateral. No Babinski or clonus noted bilateral.   Musculoskeletal: No gross boney pedal deformities bilateral. No pain, crepitus, or limitation noted with foot and ankle range of motion bilateral. Muscular strength 5/5 in all groups tested bilateral.  She has severe pain on palpation medial calcaneal tubercle of the left heel as well as pain on palpation of the posterior tibial tendon and tibialis anterior tendon near its insertion site.  Gait: Unassisted, Nonantalgic.    Radiographs:  Radiographs were already taken I evaluated those today lateral view does not show soft tissue increase in density of the plantar fascia calcaneal insertion site ankle joint itself appears to be normal.  Assessment & Plan:   Assessment: Plantar fasciitis with compensatory syndrome medial aspect left foot.  Plan: I injected the left heel today 20 mg Kenalog  5 mg Marcaine 20 maximal tenderness.  Discussed appropriate shoe gear stretching exercises ice therapy and shoe gear modifications.  I also started her on methylprednisolone  to be followed by meloxicam .  She will discontinue the diclofenac  during that time.  I will follow-up with her in 1 month questions or concerns she will notice by us  immediately.     Lachlyn Vanderstelt T. Verta, [place]    [  1]  Current Outpatient Medications:    meloxicam  (MOBIC ) 15 MG tablet, Take 1 tablet (15 mg total) by mouth daily., Disp: 30 tablet, Rfl: 3   methylPREDNISolone  (MEDROL  DOSEPAK) 4 MG TBPK tablet, 6 day dose pack - take as directed, Disp: 21 tablet, Rfl: 0   Cholecalciferol (VITAMIN D -3 PO), Take 2,000 Units by mouth., Disp: , Rfl:    Cyanocobalamin  (VITAMIN B12) 1000 MCG TBCR, 1 day or  1 dose., Disp: , Rfl:    cyclobenzaprine  (FLEXERIL ) 10 MG tablet, Take 1 tablet (10 mg total) by mouth at bedtime as needed (TMJ)., Disp: 90 tablet, Rfl: 1   diclofenac  (VOLTAREN ) 75 MG EC tablet, TAKE 1 TABLET BY MOUTH TWICE A DAY, Disp: 60 tablet, Rfl: 1   levothyroxine  (SYNTHROID ) 125 MCG tablet, Take 1 tablet (125 mcg total) by mouth daily., Disp: 90 tablet, Rfl: 3   liothyronine  (CYTOMEL ) 5 MCG tablet, Take 1 tablet (5 mcg total) by mouth daily., Disp: 90 tablet, Rfl: 3   losartan  (COZAAR ) 100 MG tablet, TAKE 1 TABLET BY MOUTH EVERY DAY, Disp: 90 tablet, Rfl: 1   pantoprazole  (PROTONIX ) 20 MG tablet, Take 20 mg by mouth every morning., Disp: , Rfl:    vitamin B-12 (CYANOCOBALAMIN ) 100 MCG tablet, Take 100 mcg by mouth daily., Disp: , Rfl:  [2]  Allergies Allergen Reactions   Vicodin [Hydrocodone-Acetaminophen] Nausea And Vomiting   "

## 2024-06-11 ENCOUNTER — Ambulatory Visit: Payer: Self-pay | Admitting: Family

## 2024-06-11 ENCOUNTER — Telehealth: Payer: Self-pay | Admitting: Radiology

## 2024-06-11 NOTE — Telephone Encounter (Signed)
 Foot X-ray marked to review today.

## 2024-06-18 NOTE — Progress Notes (Signed)
 " Cardiology Office Note:  .   Date:  06/19/2024  ID:  Olivia Mendez, DOB 1966-03-23, MRN [id number] PCP: Olivia Lavern CROME, MD  [facility] Providers Cardiologist:  Gordy Bergamo, MD   History of Present Illness: Olivia Mendez   Olivia Mendez is a 59 y.o. 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  Except for cramping in her legs at night and has been diagnosed with plantar fasciitis she has no other specific complaints.    Discussed the use of AI scribe software for clinical note transcription with the patient, who gave verbal consent to proceed.  History of Present Illness Olivia Mendez is a 59 year old female with an ascending aortic aneurysm who presents for routine follow-up.  Her ascending aorta has remained stable at 44 to 45 millimeters on CT since 2018, with the most recent CT in September 2024. An echocardiogram at that time measured the ascending aorta at 46 millimeters. She is on losartan  for blood pressure control.  She has occasional chest discomfort, which she associates with fatigue. She also reports nighttime leg cramps over the past year that wake her from sleep and attributes these to being on her feet all day.  Cardiac Studies relevent.     Echocardiogram 03/14/2023:   1. Left ventricular ejection fraction, by estimation, is 60 to 65%. Left ventricular ejection fraction by 3D volume is 59 %. The left ventricle has normal function. The left ventricle has no regional wall motion abnormalities. There is mild left ventricular hypertrophy. Left ventricular diastolic parameters were normal. The average left ventricular global longitudinal strain is -20.0 %. The global longitudinal strain is  normal. 2.The aortic valve was not well visualized. Unable to determine aortic valve morphology due to image quality. Aortic valve regurgitation is trivial. Mild aortic valve stenosis. Aortic valve area, by VTI measures 1.36 cm. Aortic valve mean gradient measures 11.0 mmHg. Aortic valve Vmax measures 2.24 m/s. (TEE 03/22/2017 confirmed Bicuspid AV) 3. Aortic dilatation noted. Aneurysm of the ascending aorta, measuring 46 mm. There is moderate dilatation of the ascending aorta.  CT angio chest 03/25/2023:  1. Stable uncomplicated fusiform aneurysmal dilatation of the ascending thoracic aorta measuring 44 mm in diameter, unchanged compared to the 02/2017 examination by my direct remeasurement.  2. Aortic aneurysm NOS (ICD10-I71.9).  3. Interval development of a 0.8 cm subpleural nodular opacity within the anteromedial aspect of the left upper lobe. Non-contrast chest CT at 6-12 months is recommended. If the nodule is stable at time of repeat CT, then future CT at 18-24 months (from today's scan) is considered optional for low-risk patients, but is recommended for high-risk patients. This recommendation follows the consensus statement: Guidelines for Management of Incidental Pulmonary Nodules Detected on CT Images: From the Fleischner Society  2017; Radiology 2017; 284:228-243.  4. Suspected hepatic steatosis.  Correlation with LFTs is advised.   EKG:   EKG Interpretation Date/Time:  Thursday June 19 2024 09:06:48 EST Ventricular Rate:  66 PR Interval:  142 QRS Duration:  100 QT Interval:  380 QTC Calculation: 398 R Axis:   17  Text Interpretation: EKG 06/19/2024: Normal sinus rhythm at rate of 66 bpm, IRBBB, normal EKG.  Compared to 03/22/2023, no change. Confirmed by Domenique Southers, Jagadeesh ([id number])  on 06/19/2024 9:08:00 AM  Labs   Lab Results  Component Value Date   CHOL 205 (H) 08/21/2023   HDL 74.20 08/21/2023   LDLCALC 111 (H) 08/21/2023   TRIG 100.0 08/21/2023   CHOLHDL 3 08/21/2023    No results found for: LIPOA  Recent Labs    08/21/23 0912 06/04/24 1041  NA 138 138  K 4.2 4.7  CL 104 102  CO2 26 23  GLUCOSE 93 104*  BUN 10 15  CREATININE 0.66 0.76  CALCIUM 10.4 10.3*    Lab Results  Component Value Date   ALT 18 06/04/2024   AST 18 06/04/2024   ALKPHOS 79 06/04/2024   BILITOT 0.8 06/04/2024      Latest Ref Rng & Units 08/21/2023    9:12 AM 06/27/2022    3:48 PM 07/25/2021    9:16 AM  CBC  WBC 4.0 - 10.5 K/uL 5.1  7.6  5.0   Hemoglobin 12.0 - 15.0 g/dL 86.0  86.0  86.4   Hematocrit 36.0 - 46.0 % 41.6  41.6  39.9   Platelets 150.0 - 400.0 K/uL 302.0  314.0  289.0    Lab Results  Component Value Date   HGBA1C 5.8 08/21/2023    Lab Results  Component Value Date   TSH 0.82 06/07/2023    ROS  Review of Systems  Cardiovascular:  Negative for chest pain, dyspnea on exertion and leg swelling.   Physical Exam:   VS:  BP 103/70 (BP Location: Left Arm, Patient Position: Sitting, Cuff Size: Normal)   Pulse 66   Ht 5' 8 (1.727 m)   Wt 198 lb (89.8 kg)   LMP 10/03/2008   SpO2 98%   BMI 30.11 kg/m    Wt Readings from Last 3 Encounters:  06/19/24 198 lb (89.8 kg)  05/30/24 201 lb 6 oz (91.3 kg)  01/31/24 195 lb (88.5 kg)    BP Readings from Last 3 Encounters:  06/19/24 103/70  05/30/24 136/82  02/02/24 129/85   Physical Exam Neck:     Vascular: No carotid bruit or JVD.  Cardiovascular:     Rate and Rhythm: Normal rate and regular rhythm.     Pulses: Intact distal pulses.     Heart sounds: S1 normal and S2 normal. Murmur heard.     Early systolic murmur is present with a grade of 2/6 at the upper right sternal border.     No gallop.  Pulmonary:     Effort: Pulmonary effort is normal.     Breath sounds: Normal breath sounds.  Abdominal:     General: Bowel sounds are normal.     Palpations: Abdomen is soft.  Musculoskeletal:     Right lower leg: No edema.     Left lower leg: No edema.     ASSESSMENT AND PLAN: .      ICD-10-CM    1. Aneurysm of ascending aorta without rupture  I71.21 EKG 12-Lead    CT Chest Wo Contrast    2. Bicuspid aortic valve  Q23.81 CT Chest Wo Contrast     Assessment & Plan Ascending aortic aneurysm without rupture The ascending aortic aneurysm has been stable since 2018, measuring between 44 to 45 mm. The last CT in September 2024 showed stability, and the echocardiogram in September 2024 confirmed a measurement of 46 mm. The aneurysm is monitored every two years due to its stability. The cutoff for repair is 5 mm due to the presence of a bicuspid aortic  valve. Blood pressure is well controlled at 103/70 mmHg with losartan , which also aids in managing the aneurysm. - Ordered plain CT of the chest without contrast. - Continue losartan  for Asc Ao Aneurysm. - Scheduled follow-up in two years unless issues arise.  Bicuspid aortic valve Present, contributing to the earlier cutoff for aneurysm repair at 5 cm. The valve is monitored as part of the management of the ascending aortic aneurysm. - Continue monitoring as part of aneurysm management. - Stable aneurysm size since 2018  Hyperlipidemia LDL cholesterol is slightly elevated at 111 mg/dL, while HDL is good at 74 mg/dL. Non-HDL cholesterol is 131 mg/dL. There is no current indication for cholesterol medication as there is no hypertension or diabetes. Weight loss efforts are ongoing, which may help control cholesterol levels. - Continue weight loss efforts to improve cholesterol levels. - Monitor cholesterol levels regularly.  Follow up: 2 Years,  f/u CT, Ascending aortic aneurysm and bicuspid AV   Signed,  Gordy Bergamo, MD, [facility] 06/19/2024, 9:22 PM [facility] [address] [place], [place] [address] Phone: [phone]. Fax:  [phone]  "

## 2024-06-19 ENCOUNTER — Encounter: Payer: Self-pay | Admitting: Cardiology

## 2024-06-19 ENCOUNTER — Ambulatory Visit: Attending: Cardiology | Admitting: Cardiology

## 2024-06-19 VITALS — BP 103/70 | HR 66 | Ht 68.0 in | Wt 198.0 lb

## 2024-06-19 DIAGNOSIS — Q2381 Bicuspid aortic valve: Secondary | ICD-10-CM

## 2024-06-19 DIAGNOSIS — I7121 Aneurysm of the ascending aorta, without rupture: Secondary | ICD-10-CM

## 2024-06-19 NOTE — Patient Instructions (Addendum)
 Medication Instructions:  Your physician recommends that you continue on your current medications as directed. Please refer to the Current Medication list given to you today.  *If you need a refill on your cardiac medications before your next appointment, please call your pharmacy*   Testing/Procedures: Your provider has requested for you to have a CT w/o contrast.   Follow-Up: At [facility], you and your health needs are our priority.  As part of our continuing mission to provide you with exceptional heart care, our providers are all part of one team.  This team includes your primary Cardiologist (physician) and Advanced Practice Providers or APPs (Physician Assistants and Nurse Practitioners) who all work together to provide you with the care you need, when you need it.  Your next appointment:   2 year(s)  Provider:   Gordy Bergamo, MD    We recommend signing up for the patient portal called MyChart.  Sign up information is provided on this After Visit Summary.  MyChart is used to connect with patients for Virtual Visits (Telemedicine).  Patients are able to view lab/test results, encounter notes, upcoming appointments, etc.  Non-urgent messages can be sent to your provider as well.   To learn more about what you can do with MyChart, go to [email or web address].           We recommend signing up for the patient portal called MyChart.  Patients are able to view lab/test results, encounter notes, upcoming appointments, etc.  Non-urgent messages can be sent to your provider as well, go to [email or web address].

## 2024-06-23 ENCOUNTER — Ambulatory Visit (HOSPITAL_COMMUNITY)
Admission: RE | Admit: 2024-06-23 | Discharge: 2024-06-23 | Disposition: A | Discharge status: Home / Self Care | Source: Ambulatory Visit | Attending: Cardiology | Admitting: Cardiology

## 2024-06-23 DIAGNOSIS — Q2381 Bicuspid aortic valve: Secondary | ICD-10-CM | POA: Insufficient documentation

## 2024-06-23 DIAGNOSIS — I7121 Aneurysm of the ascending aorta, without rupture: Secondary | ICD-10-CM | POA: Insufficient documentation

## 2024-06-28 ENCOUNTER — Ambulatory Visit: Payer: Self-pay | Admitting: Cardiology

## 2024-06-28 NOTE — Progress Notes (Signed)
 44 to 45 mm. The last CT in September 2024 showed stability, and the echocardiogram in September 2024 confirmed a measurement of 46 mm.   CT chest without contrast 06/23/2024: Heart and pericardium are unremarkable.  Chest is clear. Fusiform dilatation of the ascending aorta measuring 4.6 cm.  Overall stable ascending aortic aneurysm.  Patient has bicuspid aortic valve, threshold for repair would be around 5 cm.

## 2024-07-22 ENCOUNTER — Ambulatory Visit: Admitting: Podiatry

## 2024-08-25 ENCOUNTER — Encounter: Admitting: Family Medicine

## 2024-08-29 ENCOUNTER — Other Ambulatory Visit

## 2024-09-02 ENCOUNTER — Ambulatory Visit: Admitting: Endocrinology

## 2024-09-11 ENCOUNTER — Other Ambulatory Visit

## 2024-09-15 ENCOUNTER — Ambulatory Visit: Admitting: Endocrinology
